# Patient Record
Sex: Male | Born: 1950
Health system: Southern US, Community
[De-identification: ages and names within clinical notes are randomized; demographics above are authoritative.]

## PROBLEM LIST (undated history)

## (undated) DIAGNOSIS — I10 Essential (primary) hypertension: Secondary | ICD-10-CM

## (undated) DIAGNOSIS — C801 Malignant (primary) neoplasm, unspecified: Secondary | ICD-10-CM

## (undated) DIAGNOSIS — K759 Inflammatory liver disease, unspecified: Secondary | ICD-10-CM

## (undated) HISTORY — PX: OTHER SURGICAL HISTORY: SHX169

## (undated) NOTE — *Deleted (*Deleted)
[facility]    CSN: [id number] Arrival date & time: 03/10/20  1452      History   Chief Complaint Chief Complaint  Patient presents with  . Chest Pain    rib pain    HPI AC COLAN is a 53 y.o. male.   HPI  Past Medical History:  Diagnosis Date  . Cancer [facility])    prostate  . Hepatitis    hepatitis c   . Hypertension     Patient Active Problem List   Diagnosis Date Noted  . Prostate cancer (HCC) 02/03/2016    Past Surgical History:  Procedure Laterality Date  . 4th index finger straightened  1990's  . finger surgery for injury  1970's right hand  . PELVIC LYMPH NODE DISSECTION N/A 02/03/2016   Procedure: PELVIC LYMPH NODE DISSECTION;  Surgeon: Crist Fat, MD;  Location: [facility];  Service: Urology;  Laterality: N/A;  . ROBOT ASSISTED LAPAROSCOPIC RADICAL PROSTATECTOMY N/A 02/03/2016   Procedure: XI ROBOTIC ASSISTED LAPAROSCOPIC RADICAL PROSTATECTOMY;  Surgeon: Crist Fat, MD;  Location: [facility];  Service: Urology;  Laterality: N/A;       Home Medications    Prior to Admission medications   Medication Sig Start Date End Date Taking? Authorizing Provider  amLODipine (NORVASC) 10 MG tablet Take 1 tablet (10 mg total) by mouth daily. 03/10/20 06/08/20  Moshe Cipro, NP  docusate sodium (COLACE) 100 MG capsule Take 1 capsule (100 mg total) by mouth 2 (two) times daily. 02/04/16   Crist Fat, MD  oxyCODONE (OXY IR/ROXICODONE) 5 MG immediate release tablet Take 1-2 tablets (5-10 mg total) by mouth every 4 (four) hours as needed for moderate pain. 02/04/16   Crist Fat, MD  sulfamethoxazole-trimethoprim (BACTRIM DS,SEPTRA DS) 800-160 MG tablet Take 1 tablet by mouth 2 (two) times daily. Start taking one day prior to your appointment for your first follow-up and catheter removal.  Continue taking for three days. 02/04/16   Crist Fat, MD    Family History History reviewed. No pertinent family history.   Social History Social History   Tobacco Use  . Smoking status: Never Smoker  . Smokeless tobacco: Never Used  Substance Use Topics  . Alcohol use: Yes    Comment: 6 pack beer most days  . Drug use: Yes    Types: Marijuana    Comment: marijuana occ last used 01-31-16     Allergies   Patient has no known allergies.   Review of Systems Review of Systems   Physical Exam Triage Vital Signs ED Triage Vitals  Enc Vitals Group     BP 03/10/20 1607 (!) 186/112     Pulse Rate 03/10/20 1607 88     Resp 03/10/20 1607 18     Temp 03/10/20 1607 98.9 F (37.2 C)     Temp Source 03/10/20 1607 Oral     SpO2 03/10/20 1607 100 %     Weight --      Height --      Head Circumference --      Peak Flow --      Pain Score 03/10/20 1606 3     Pain Loc --      Pain Edu? --      Excl. in GC? --    No data found.  Updated Vital Signs BP (!) 186/112 (BP Location: Left Arm)   Pulse 88   Temp 98.9 F (37.2 C) (Oral)   Resp 18  SpO2 100%   Visual Acuity Right Eye Distance:   Left Eye Distance:   Bilateral Distance:    Right Eye Near:   Left Eye Near:    Bilateral Near:     Physical Exam   UC Treatments / Results  Labs (all labs ordered are listed, but only abnormal results are displayed) Labs Reviewed - No data to display  EKG   Radiology No results found.  Procedures Procedures (including critical care time)  Medications Ordered in UC Medications - No data to display  Initial Impression / Assessment and Plan / UC Course  I have reviewed the triage vital signs and the nursing notes.  Pertinent labs & imaging results that were available during my care of the patient were reviewed by me and considered in my medical decision making (see chart for details).     *** Final Clinical Impressions(s) / UC Diagnoses   Final diagnoses:  Essential hypertension  Medication refill  Rib pain on right side  Muscle strain     Discharge Instructions     I have  refilled your blood pressure medication.  Take this daily to help control your blood pressure.  I think that you may have pulled a muscle on your side.  I have you take ibuprofen or Tylenol for this pain.  You may lay on a heating pad if this is not enough.  Follow up with this office or with primary care if symptoms are persisting.  Follow up in the ER for high fever, trouble swallowing, trouble breathing, other concerning symptoms.    ED Prescriptions    Medication Sig Dispense Auth. Provider   amLODipine (NORVASC) 10 MG tablet Take 1 tablet (10 mg total) by mouth daily. 90 tablet Moshe Cipro, NP     PDMP not reviewed this encounter.

---

## 2001-03-31 ENCOUNTER — Emergency Department (HOSPITAL_COMMUNITY): Admission: EM | Admit: 2001-03-31 | Discharge: 2001-03-31 | Payer: Self-pay | Admitting: Emergency Medicine

## 2004-08-11 ENCOUNTER — Ambulatory Visit: Payer: Self-pay | Admitting: Internal Medicine

## 2006-07-16 ENCOUNTER — Emergency Department (HOSPITAL_COMMUNITY): Admission: EM | Admit: 2006-07-16 | Discharge: 2006-07-16 | Payer: Self-pay | Admitting: Emergency Medicine

## 2015-08-11 ENCOUNTER — Emergency Department (HOSPITAL_COMMUNITY): Payer: 59

## 2015-08-11 ENCOUNTER — Encounter (HOSPITAL_COMMUNITY): Payer: Self-pay

## 2015-08-11 ENCOUNTER — Emergency Department (HOSPITAL_COMMUNITY)
Admission: EM | Admit: 2015-08-11 | Discharge: 2015-08-11 | Disposition: A | Discharge status: Home / Self Care | Payer: 59 | Attending: Emergency Medicine | Admitting: Emergency Medicine

## 2015-08-11 DIAGNOSIS — I10 Essential (primary) hypertension: Secondary | ICD-10-CM | POA: Diagnosis not present

## 2015-08-11 DIAGNOSIS — M25512 Pain in left shoulder: Secondary | ICD-10-CM | POA: Diagnosis not present

## 2015-08-11 DIAGNOSIS — R531 Weakness: Secondary | ICD-10-CM | POA: Diagnosis not present

## 2015-08-11 NOTE — ED Notes (Signed)
Patient here with left shoulder pain x 2 days, denies injury with same. Complains of some numbness to fingers since pain started

## 2015-08-11 NOTE — ED Notes (Signed)
Patient transported to X-ray 

## 2015-08-11 NOTE — ED Notes (Signed)
Pt is in stable condition upon d/c and ambulates from ED. 

## 2015-08-11 NOTE — ED Provider Notes (Signed)
CSN: [id number]     Arrival date & time 08/11/15  1126 History   First MD Initiated Contact with Patient 08/11/15 1554     Chief Complaint  Patient presents with  . Shoulder Pain   (Consider location/radiation/quality/duration/timing/severity/associated sxs/prior Treatment) HPI Comments: Patient with no known past medical history, doesn't see a doctor -- presents with complaint of intermittent left shoulder pain and weakness over the past 1 week. Patient has a history of a soft tissue mass on the superior aspect of his left shoulder that he has had since age 42. This is unchanged. Patient complains of an aching pain in left shoulder that resolved with ibuprofen and Goody powder. He noted that he had weakness in his left hand was able to able to turn a key or use a lighter. These symptoms are improved today but states he wanted to 'nip it in the bud'. No associated chest pain or shortness of breath. No neck pain. He does not report any injuries. Patient denies signs of stroke including: facial droop, slurred speech, aphasia, weakness/numbness in extremities, imbalance/trouble walking. The onset of this condition was acute. Aggravating factors: none. Alleviating factors: none.     Patient is a 65 y.o. male presenting with shoulder pain. The history is provided by the patient.  Shoulder Pain Associated symptoms: no back pain, no fever and no neck pain     History reviewed. No pertinent past medical history. History reviewed. No pertinent past surgical history. No family history on file. Social History  Substance Use Topics  . Smoking status: Never Smoker   . Smokeless tobacco: None  . Alcohol Use: None    Review of Systems  Constitutional: Negative for fever, diaphoresis and activity change.  HENT: Negative for rhinorrhea and sore throat.   Eyes: Negative for redness.  Respiratory: Negative for cough and shortness of breath.   Cardiovascular: Negative for chest pain, palpitations and leg  swelling.  Gastrointestinal: Negative for nausea, vomiting, abdominal pain and diarrhea.  Genitourinary: Negative for dysuria.  Musculoskeletal: Positive for arthralgias. Negative for myalgias, back pain, joint swelling, gait problem and neck pain.  Skin: Negative for rash and wound.  Neurological: Positive for weakness. Negative for syncope, light-headedness, numbness and headaches.  Psychiatric/Behavioral: The patient is not nervous/anxious.     Allergies  Review of patient's allergies indicates not on file.  Home Medications   Prior to Admission medications   Not on File   BP 198/104 mmHg  Pulse 73  Temp(Src) 97.7 F (36.5 C) (Oral)  Resp 18  SpO2 100%   Physical Exam  Constitutional: He appears well-developed and well-nourished.  HENT:  Head: Normocephalic and atraumatic.  Eyes: Conjunctivae are normal. Right eye exhibits no discharge. Left eye exhibits no discharge.  Neck: Normal range of motion. Neck supple.  Cardiovascular: Normal rate, regular rhythm and normal heart sounds.   Pulmonary/Chest: Effort normal and breath sounds normal.  Abdominal: Soft. There is no tenderness.  Musculoskeletal:       Left shoulder: He exhibits deformity (5 cm soft tissue mass anterior shoulder, likely lipoma). He exhibits normal range of motion and no tenderness.       Left elbow: Normal.       Left wrist: Normal.       Cervical back: Normal. He exhibits normal range of motion and no tenderness.       Thoracic back: Normal. He exhibits normal range of motion and no tenderness.       Left upper arm: Normal.  Left forearm: Normal.       Left hand: Normal.  Neurological: He is alert.  Skin: Skin is warm and dry.  Psychiatric: He has a normal mood and affect.  Nursing note and vitals reviewed.   ED Course  Procedures (including critical care time)  Imaging Review Dg Shoulder Left  08/11/2015  CLINICAL DATA:  Left shoulder pain and left hand weakness. No recent injury. EXAM:  LEFT SHOULDER - 2+ VIEW COMPARISON:  None. FINDINGS: Nonspecific masslike soft tissue thickening in the superior left back. No fracture, dislocation, Hill-Sachs deformity, bone erosions or suspicious focal osseous lesion. Minimal osteoarthritis in the left acromioclavicular an inferior left glenohumeral joints. No pathologic soft tissue calcifications. IMPRESSION: 1. Nonspecific masslike soft tissue thickening in the superior left back. Recommend correlation with clinical exam. Further evaluation, including any decision to pursue additional imaging, should be based on clinical assessment . 2. No fracture or malalignment in the left shoulder. No bone lesions. 3. Minimal osteoarthritis as described in the left shoulder. Electronically Signed   By: Ilona Sorrel M.D.   On: 08/11/2015 17:02   I have personally reviewed and evaluated these images and lab results as part of my medical decision-making.  ED ECG REPORT   Date: 08/11/2015  Rate: 88  Rhythm: normal sinus rhythm  QRS Axis: normal  Intervals: QT prolonged  ST/T Wave abnormalities: normal  Conduction Disutrbances:none  Narrative Interpretation:   Old EKG Reviewed: none available  I have personally reviewed the EKG tracing and agree with the computerized printout as noted.   4:18 PM Patient seen and examined. X-ray ordered. EKG reviewed.   Vital signs reviewed and are as follows: BP 198/104 mmHg  Pulse 73  Temp(Src) 97.7 F (36.5 C) (Oral)  Resp 18  SpO2 100%   4:42 PM Patient to x-ray. Discussed with Dr. Johnney Killian who will see.   5:30 PM X-ray as above. Dr. Johnney Killian has seen. No indications for admission. PCP and ortho referrals given.   Patient urged to return with worsening symptoms or other concerns, inability to move arm, chest pain, shortness of breath, color change in arm. Patient verbalized understanding and agrees with plan.   MDM   Final diagnoses:  Shoulder pain, acute, left  Essential hypertension   Patient with L  shoulder pain, intermittent, now resolved, with intermittent trouble moving hand. Likely peripheral nerve etiology, shoulder impingement? Unclear if soft tissue mass is contributing. Doubt stroke/TIA given no other associated symptoms. Patient has good peripheral pulses and No other skin findings suggestive of vascular etiology. No chest pain, shortness of breath or other symptoms consistent with ACS to make me think this is referred pain from the chest -- although admittedly patient has poorly defined risk factor profile. Feel patient can be discharged to home at this time with appropriate follow-up.    Carlisle Cater, PA-C 08/11/15 1736  Charlesetta Shanks, MD 08/11/15 [phone]

## 2015-08-11 NOTE — Discharge Instructions (Signed)
Please read and follow all provided instructions.  Your diagnoses today include:  1. Shoulder pain, acute, left   2. Essential hypertension     Tests performed today include:  An x-ray of the affected area - does NOT show any broken bones  EKG - no signs of a heart attack  Vital signs. See below for your results today.   Medications prescribed:   None  Take any prescribed medications only as directed.  Home care instructions:   Follow any educational materials contained in this packet  Follow R.I.C.E. Protocol:  R - rest your injury   I  - use ice on injury without applying directly to skin  C - compress injury with bandage or splint  E - elevate the injury as much as possible  Follow-up instructions: Please follow-up with your primary care provider or the provided orthopedic physician in the next week.   It is very important that you establish care with a primary care physician for a general physical. Please contact the referrals given to you.  Return instructions:   Please return if your fingers are numb or tingling, appear gray or blue, or you have severe pain (also elevate the arm and loosen splint or wrap if you were given one)  Please return to the Emergency Department if you experience worsening symptoms.   Please return if you have any other emergent concerns.  Additional Information:  Your vital signs today were: BP 195/108 mmHg   Pulse 73   Temp(Src) 97.7 F (36.5 C) (Oral)   Resp 23   SpO2 100% If your blood pressure (BP) was elevated above 135/85 this visit, please have this repeated by your doctor within one month. -------------- [place] [address] is a great source of information about community services available.  Access by dialing 2-1-1 from anywhere in [place], or by website -  CustodianSupply.fi.   Other Local Resources (Updated 04/2015)  [place]    Phone  Number and Address  [place] medical care - 1st and 3rd Saturday of every month  Must not qualify for public or private insurance and must have limited income [phone] [address], [place]  Child care  Emergency assistance for housing and [employer]  Medicaid [phone] [address], [place] [address]   [facility] Department  Low-cost medical care for children, communicable diseases, sexually-transmitted diseases, immunizations, maternity care, womens health and family planning [phone] [address] [place], [place] [address]  [facility]   Medication assistance for [facility] residents  Must meet income requirements [phone] [place], [place].    [place]  Child care  Emergency assistance for housing and [employer]  Medicaid [phone] [address] [place], [place] [address]  [facility] [place]   Low-cost medical care,   Monday through Friday, 9 am to 6 pm.   Accepts Medicare/Medicaid, and self-pay [phone] [address] [place], [place] [address]  [facility] for [place] care - Monday through Friday, 8:30 am - 5:30 pm  Accepts Medicaid and self-pay [phone] [address], [place], [place] [address]   [facility]  Primary medical care, including for those with sickle cell disease  Accepts Medicare, Medicaid, insurance  and self-pay [phone] [address] [place], [place]  [facility]   Primary medical care  Accepts Medicare, [place], insurance and self-pay [phone] [address] [place], [place] [address]   [facility] Department of Social Services  Child  care  Emergency assistance for housing and [employer]  Medicaid [phone] [address] [place], [place] [address]  [place] Department of Health and [employer]  Child care  Emergency assistance for housing and [employer]  Medicaid [phone] [place], [place] [address]   [facility] Medication Assistance Program  Medication assistance for [facility] residents with no insurance only  Must have a primary care doctor [phone], [place], [place]  [facility]   Primary medical care  [place], [place], insurance  [phone] [place], [place]  [facility]   Medication assistance [phone]  [facility]   Primary medical care  Accepts Medicare, [place], insurance and self-pay [phone] [address] [place], [place] [address]  [facility]   Primary medical care  Accepts Medicare, [place], insurance and self-pay [phone] [address] [place], [place] [address]  [facility]  For [place] residents between the ages of 55 and 8 who do not have any form of health insurance, Medicare, [place], or [place] benefits.  Services are provided free of charge to uninsured patients who fall within federal poverty guidelines.    Hours: Tuesdays and Thursdays, 4:15 - 8 pm [phone] [address] [place], [place] [address]  [facility]     Primary medical care  Dental care  Nutritional counseling  Pharmacy  Accepts Medicaid, Medicare, most insurance.  Fees are adjusted based on ability to pay.   [place], [place] [address], [place],  [place] [facility], [place], [place] [facility] [place], [place]  [facility]  Womens health and family planning [phone] [place]. [place], [place] care  Emergency assistance for housing and [employer]  Medicaid [phone] [address] [place], [place] [address]   [facility]    Ages 61 and older  Hours: Mondays and Thursdays, 7:00 am - 9:00 am Patients are seen on a first come, first served basis. [phone], ext. [place], [place]  Child care  Emergency assistance for housing and [employer]  Medicaid [phone] [address], [place] [address]  [facility] [place]  Medication assistance  Rental assistance  Food pantry  Medication assistance  Housing assistance  Emergency food distribution  Utility assistance [facility] [place], [place]  [address]. [place], [place] [address] Hours: Tuesdays and Thursdays from 9am - 12 noon by appointment only  [place], [place] [address]  [facility] [place] private insurance, [place], and [place].  Payment is based on a sliding scale for those without insurance.  Hours: Mondays, Tuesdays and Thursdays, 8:30 am - 5:30 pm.   [phone] [place],  [place]  [facility] - Family Medicine at [facility], [place], and [place].  Payment is based on a sliding scale for those without insurance. [phone] [address] [place], [place]  [facility] - Pediatrics at [address], research (medical), [employer], and [place].  Payment is  based on a sliding scale for those without insurance [phone] [address], [employer], [place]  [facility] - Pediatrics at [employer], [place], and [place].  Payment is based on a sliding scale for those without insurance. [phone] [place], [place]  [facility] - Pediatrics at [facility], [place], and [place].  Payment is based on a sliding scale for those without insurance. [phone], ext. [phone] [place], [place].    [place] care.  Accepts Medicaid and self-pay. [place], [place]

## 2015-08-31 ENCOUNTER — Encounter: Payer: Self-pay | Admitting: Family Medicine

## 2015-08-31 ENCOUNTER — Ambulatory Visit (INDEPENDENT_AMBULATORY_CARE_PROVIDER_SITE_OTHER): Payer: 59 | Admitting: Family Medicine

## 2015-08-31 VITALS — BP 153/112 | HR 95 | Temp 98.0°F | Ht 70.0 in | Wt 136.0 lb

## 2015-08-31 DIAGNOSIS — Z1159 Encounter for screening for other viral diseases: Secondary | ICD-10-CM | POA: Diagnosis not present

## 2015-08-31 DIAGNOSIS — Z125 Encounter for screening for malignant neoplasm of prostate: Secondary | ICD-10-CM | POA: Diagnosis not present

## 2015-08-31 DIAGNOSIS — Z7689 Persons encountering health services in other specified circumstances: Secondary | ICD-10-CM

## 2015-08-31 DIAGNOSIS — I10 Essential (primary) hypertension: Secondary | ICD-10-CM

## 2015-08-31 DIAGNOSIS — Z114 Encounter for screening for human immunodeficiency virus [HIV]: Secondary | ICD-10-CM

## 2015-08-31 DIAGNOSIS — Z23 Encounter for immunization: Secondary | ICD-10-CM | POA: Diagnosis not present

## 2015-08-31 DIAGNOSIS — Z1211 Encounter for screening for malignant neoplasm of colon: Secondary | ICD-10-CM

## 2015-08-31 LAB — CBC WITH DIFFERENTIAL/PLATELET
BASOS ABS: 0 {cells}/uL (ref 0–200)
BASOS PCT: 0 %
EOS ABS: 41 {cells}/uL (ref 15–500)
Eosinophils Relative: 1 %
HEMATOCRIT: 46.1 % (ref 38.5–50.0)
Hemoglobin: 15.1 g/dL (ref 13.2–17.1)
LYMPHS PCT: 44 %
Lymphs Abs: 1804 cells/uL (ref 850–3900)
MCH: 27.9 pg (ref 27.0–33.0)
MCHC: 32.8 g/dL (ref 32.0–36.0)
MCV: 85.1 fL (ref 80.0–100.0)
MONO ABS: 533 {cells}/uL (ref 200–950)
MONOS PCT: 13 %
MPV: 9.4 fL (ref 7.5–12.5)
NEUTROS PCT: 42 %
Neutro Abs: 1722 cells/uL (ref 1500–7800)
PLATELETS: 212 10*3/uL (ref 140–400)
RBC: 5.42 MIL/uL (ref 4.20–5.80)
RDW: 15.6 % — AB (ref 11.0–15.0)
WBC: 4.1 10*3/uL (ref 3.8–10.8)

## 2015-08-31 LAB — TSH: TSH: 2.82 m[IU]/L (ref 0.40–4.50)

## 2015-08-31 LAB — COMPLETE METABOLIC PANEL WITH GFR
ALT: 27 U/L (ref 9–46)
AST: 39 U/L — AB (ref 10–35)
Albumin: 4.3 g/dL (ref 3.6–5.1)
Alkaline Phosphatase: 52 U/L (ref 40–115)
BILIRUBIN TOTAL: 0.8 mg/dL (ref 0.2–1.2)
BUN: 10 mg/dL (ref 7–25)
CHLORIDE: 100 mmol/L (ref 98–110)
CO2: 29 mmol/L (ref 20–31)
CREATININE: 0.91 mg/dL (ref 0.70–1.25)
Calcium: 9.5 mg/dL (ref 8.6–10.3)
GFR, Est Non African American: 88 mL/min (ref >=60)
GLUCOSE: 91 mg/dL (ref 65–99)
Potassium: 4.7 mmol/L (ref 3.5–5.3)
Sodium: 138 mmol/L (ref 135–146)
TOTAL PROTEIN: 7.6 g/dL (ref 6.1–8.1)

## 2015-08-31 LAB — LIPID PANEL
Cholesterol: 262 mg/dL — ABNORMAL HIGH (ref 125–200)
HDL: 131 mg/dL (ref >=40)
LDL CALC: 123 mg/dL (ref <130)
Total CHOL/HDL Ratio: 2 Ratio (ref <=5.0)
Triglycerides: 41 mg/dL (ref <150)
VLDL: 8 mg/dL (ref <30)

## 2015-08-31 MED ORDER — AMLODIPINE BESYLATE 10 MG PO TABS: 10.0000 mg | ORAL_TABLET | Freq: Every day | ORAL | Status: DC

## 2015-08-31 NOTE — Progress Notes (Signed)
Patient ID: Dennis Levine, male   DOB: [birth year], 65 y.o.   MRN: [id number]   Dennis Levine, is a 65 y.o. male  [id number]  [id number]  DOB - 07/01/1950  CC:  Chief Complaint  Patient presents with  . new patient/get established    denies any shoulder pain today, in ER had Hypertension, here for follow up and rx if indicated       HPI: Dennis Levine is a 65 y.o. male here to establish care. He was seen in ED on 5/2 for left shoulder pain with numbness and weakness in left hand. He was referred to ortho about that. He was found to have BP of 195/108 and was referred here for further assessment and treatment. His only other complaint is of a large growth on his posterior left shoulder which has been there for years. He does think that it has gradually gotten larger over time. He reports the shoulder pain he was experiencing is gone but he still sometimes feels some numbness in left arm and the grip is weaker on the left. He is on no medications.   He has not had regular health care for many years.  Health Maintenance:  He is in need of Tdap, pneumonia, colon cancer screening, HIV and Hep C screening. He has never been screened for prostate cancer.  No Known Allergies History reviewed. No pertinent past medical history. No current outpatient prescriptions on file prior to visit.   No current facility-administered medications on file prior to visit.   History reviewed. No pertinent family history. Social History   Social History  . Marital Status: Single    Spouse Name: N/A  . Number of Children: N/A  . Years of Education: N/A   Occupational History  . Not on file.   Social History Main Topics  . Smoking status: Never Smoker   . Smokeless tobacco: Not on file  . Alcohol Use: No  . Drug Use: No  . Sexual Activity: Not on file   Other Topics Concern  . Not on file   Social History Narrative    Review of Systems: Constitutional: Negative for fever, chills,  appetite change, weight loss,  Fatigue. Skin: Negative for rashes or lesions of concern.Positive for growth on posterior left shoulder HENT: Negative for ear pain, ear discharge.nose bleeds. Reports needing dental care Eyes: Negative for pain, discharge, redness, itching. Reports decreased vision and needing glasses Neck: Negative for pain, stiffness Respiratory: Negative for cough, shortness of breath,   Cardiovascular: Negative for chest pain, palpitations and leg swelling. Gastrointestinal: Negative for abdominal pain, nausea, vomiting, diarrhea, constipations Genitourinary: Negative for dysuria, urgency, frequency, hematuria,  Musculoskeletal: Negative for back pain, joint pain, joint  swelling, and gait problem.Positive for recent shoulder pain, which has resolved Neurological: Negative for dizziness, tremors, seizures, syncope,   light-headedness, and headaches. Positive for numbness and weakness of left hand Hematological: Negative for easy bruising or bleeding Psychiatric/Behavioral: Negative for depression, anxiety, decreased concentration, confusion   Objective:   Filed Vitals:   08/31/15 1115  BP: 153/112  Pulse: 95  Temp: 98 F (36.7 C)    Physical Exam: Constitutional: Patient appears well-developed and well-nourished. No distress. HENT: Normocephalic, atraumatic, External right and left ear normal. Oropharynx is clear and moist. There are missing teeth and dental decay present Eyes: Conjunctivae and EOM are normal. PERRLA, no scleral icterus.Irises with gray borders Neck: Normal ROM. Neck supple. No lymphadenopathy, No thyromegaly. CVS: RRR, S1/S2 +, no murmurs, no gallops, no  rubs Pulmonary: Effort and breath sounds normal, no stridor, rhonchi, wheezes, rales.  Abdominal: Soft. Normoactive BS,, no distension, tenderness, rebound or guarding.  Musculoskeletal: Normal range of motion. No edema and no tenderness.  Neuro: Alert.Normal muscle tone coordination.  Non-focal Skin: Skin is warm and dry. No rash noted. Not diaphoretic. No erythema. No pallor.There is a large growth consistent with lipoma on upper left posterior shoulder Psychiatric: Normal mood and affect. Behavior, judgment, thought content normal.  No results found for: WBC, HGB, HCT, MCV, PLT No results found for: CREATININE, BUN, NA, K, CL, CO2  No results found for: HGBA1C Lipid Panel  No results found for: CHOL, TRIG, HDL, CHOLHDL, VLDL, LDLCALC     Assessment and plan:   1. Essential hypertension  - COMPLETE METABOLIC PANEL WITH GFR - CBC with Differential - Lipid panel - TSH  2. Prostate cancer screening  - PSA  3. Screening for HIV (human immunodeficiency virus)  - HIV antibody (with reflex)  4. Need for hepatitis C screening test  - Hepatitis C Antibody  5. Need for Tdap vaccination  - Tdap vaccine greater than or equal to 7yo IM  6. Need for prophylactic vaccination against Streptococcus pneumoniae (pneumococcus)  - Pneumococcal polysaccharide vaccine 23-valent greater than or equal to 2yo subcutaneous/IM  7. Colon cancer screening  - Ambulatory referral to Gastroenterology  8. Encounter to establish care - I have reviewed information presented by the patient w/o interpreter and review hospital records.   Return in about 3 weeks (around 09/21/2015).  The patient was given clear instructions to go to ER or return to medical center if symptoms don't improve, worsen or new problems develop. The patient verbalized understanding.    Micheline Chapman FNP  08/31/2015, 11:38 AM

## 2015-08-31 NOTE — Patient Instructions (Signed)
Nurse visit for BP check in 3 weeks. Watch salt in diet carefully.  Take amlodipine 10 mg for BP every day. Call number given in ED about arm numbness and weakness.

## 2015-09-01 LAB — HIV ANTIBODY (ROUTINE TESTING W REFLEX): HIV 1&2 Ab, 4th Generation: NONREACTIVE

## 2015-09-01 LAB — PSA: PSA: 26.36 ng/mL — AB (ref <=4.00)

## 2015-09-01 LAB — HEPATITIS C ANTIBODY: HCV AB: REACTIVE — AB

## 2015-09-02 LAB — HEPATITIS C RNA QUANTITATIVE
HCV Quantitative Log: 6.13 {Log} — ABNORMAL HIGH (ref <1.18)
HCV Quantitative: 1345675 IU/mL — ABNORMAL HIGH (ref <15)

## 2015-09-04 ENCOUNTER — Other Ambulatory Visit: Payer: Self-pay | Admitting: Family Medicine

## 2015-09-04 DIAGNOSIS — R972 Elevated prostate specific antigen [PSA]: Secondary | ICD-10-CM

## 2015-09-04 DIAGNOSIS — B1920 Unspecified viral hepatitis C without hepatic coma: Secondary | ICD-10-CM

## 2015-09-08 ENCOUNTER — Telehealth: Payer: Self-pay

## 2015-09-08 NOTE — Telephone Encounter (Signed)
Called patient, no answer. Left message to call back. Thanks!

## 2015-09-08 NOTE — Telephone Encounter (Signed)
-----   Message from Micheline Chapman, NP sent at 09/04/2015  8:01 AM EDT ----- Couple of things of concern in bloodwork. Hep C positve. Need to send to [facility]. PSA (prostate) was elevated. Need to send to urologist.

## 2015-09-09 NOTE — Telephone Encounter (Signed)
I have tried to call multiple times, with no response will mail letter today marked "confidential". Thanks!

## 2015-09-21 ENCOUNTER — Ambulatory Visit: Payer: 59

## 2015-09-21 VITALS — BP 132/86 | HR 78

## 2015-09-21 DIAGNOSIS — I10 Essential (primary) hypertension: Secondary | ICD-10-CM

## 2015-09-30 ENCOUNTER — Telehealth: Payer: Self-pay

## 2015-09-30 DIAGNOSIS — I10 Essential (primary) hypertension: Secondary | ICD-10-CM

## 2015-09-30 MED ORDER — AMLODIPINE BESYLATE 10 MG PO TABS: 10.0000 mg | ORAL_TABLET | Freq: Every day | ORAL | Status: DC

## 2015-09-30 NOTE — Telephone Encounter (Signed)
Pt is requesting a medication refill on his Amlodopine. Thanks!

## 2015-09-30 NOTE — Telephone Encounter (Signed)
This has been sent into pharmacy. Thanks!  

## 2015-10-02 ENCOUNTER — Encounter: Payer: Self-pay | Admitting: Family Medicine

## 2015-10-12 NOTE — Progress Notes (Signed)
Tiffany, can you tell me how to close this encounter? It says I do not have clearance to do so. Thank you,  Graciella Freer

## 2015-10-21 ENCOUNTER — Other Ambulatory Visit: Payer: Self-pay

## 2015-10-21 DIAGNOSIS — I10 Essential (primary) hypertension: Secondary | ICD-10-CM

## 2015-10-21 MED ORDER — AMLODIPINE BESYLATE 10 MG PO TABS: 10.0000 mg | ORAL_TABLET | Freq: Every day | ORAL | Status: DC

## 2015-10-21 MED ORDER — AMLODIPINE BESYLATE 10 MG PO TABS
10.0000 mg | ORAL_TABLET | Freq: Every day | ORAL | Status: DC
Start: 2015-10-21 — End: 2015-10-21

## 2015-10-21 NOTE — Telephone Encounter (Signed)
Sent into refill for amlodipine to mail order pharmacy. Thanks!~

## 2015-11-10 ENCOUNTER — Other Ambulatory Visit (HOSPITAL_COMMUNITY): Payer: Self-pay | Admitting: Urology

## 2015-11-10 DIAGNOSIS — C61 Malignant neoplasm of prostate: Secondary | ICD-10-CM

## 2015-11-20 ENCOUNTER — Encounter (HOSPITAL_COMMUNITY)
Admission: RE | Admit: 2015-11-20 | Discharge: 2015-11-20 | Disposition: A | Discharge status: Home / Self Care | Payer: 59 | Source: Ambulatory Visit | Attending: Urology | Admitting: Urology

## 2015-11-20 DIAGNOSIS — C61 Malignant neoplasm of prostate: Secondary | ICD-10-CM | POA: Insufficient documentation

## 2016-01-05 ENCOUNTER — Other Ambulatory Visit: Payer: Self-pay | Admitting: Urology

## 2016-01-26 ENCOUNTER — Encounter: Payer: Self-pay | Admitting: Physician Assistant

## 2016-01-28 NOTE — Patient Instructions (Addendum)
Dennis Levine  01/28/2016   Your procedure is scheduled on:  02-03-16  Report to [facility]  Entrance take [facility]  elevators to 3rd floor to  [place] at 1000   AM.  Call this number if you have problems the morning of surgery [phone]   Remember: ONLY 1 PERSON MAY GO WITH YOU TO SHORT STAY TO GET  READY MORNING OF [place].  Do not eat food :After Midnight tonight, clear liquids all day Tuesday 02-02-16, no clear liquids after midnight Tuesday.   FOLLOW ANY BOWEL PREP OR CLEAR LIQUID INSTRUCTIONS GIVEN BY DR Louis Meckel   Take these medicines the morning of surgery with A SIP OF WATER: AMLODIPINE                                You may not have any metal on your body including hair pins and              piercings  Do not wear jewelry, make-up, lotions, powders or perfumes, deodorant             Do not wear nail polish.  Do not shave  48 hours prior to surgery.              Men may shave face and neck.   Do not bring valuables to the hospital. [place].  Contacts, dentures or bridgework may not be worn into surgery.  Leave suitcase in the car. After surgery it may be brought to your room.                  Please read over the following fact sheets you were given: _____________________________________________________________________             [facility] - Preparing for Surgery Before surgery, you can play an important role.  Because skin is not sterile, your skin needs to be as free of germs as possible.  You can reduce the number of germs on your skin by washing with CHG (chlorahexidine gluconate) soap before surgery.  CHG is an antiseptic cleaner which kills germs and bonds with the skin to continue killing germs even after washing. Please DO NOT use if you have an allergy to CHG or antibacterial soaps.  If your skin becomes reddened/irritated stop using the CHG and inform your nurse  when you arrive at Short Stay. Do not shave (including legs and underarms) for at least 48 hours prior to the first CHG shower.  You may shave your face/neck. Please follow these instructions carefully:  1.  Shower with CHG Soap the night before surgery and the  morning of Surgery.  2.  If you choose to wash your hair, wash your hair first as usual with your  normal  shampoo.  3.  After you shampoo, rinse your hair and body thoroughly to remove the  shampoo.                           4.  Use CHG as you would any other liquid soap.  You can apply chg directly  to the skin and wash  Gently with a scrungie or clean washcloth.  5.  Apply the CHG Soap to your body ONLY FROM THE NECK DOWN.   Do not use on face/ open                           Wound or open sores. Avoid contact with eyes, ears mouth and genitals (private parts).                       Wash face,  Genitals (private parts) with your normal soap.             6.  Wash thoroughly, paying special attention to the area where your surgery  will be performed.  7.  Thoroughly rinse your body with warm water from the neck down.  8.  DO NOT shower/wash with your normal soap after using and rinsing off  the CHG Soap.                9.  Pat yourself dry with a clean towel.            10.  Wear clean pajamas.            11.  Place clean sheets on your bed the night of your first shower and do not  sleep with pets. Day of Surgery : Do not apply any lotions/deodorants the morning of surgery.  Please wear clean clothes to the hospital/surgery center.  FAILURE TO FOLLOW THESE INSTRUCTIONS MAY RESULT IN THE CANCELLATION OF YOUR SURGERY PATIENT SIGNATURE_________________________________  NURSE SIGNATURE__________________________________  ________________________________________________________________________  WHAT IS A BLOOD TRANSFUSION? Blood Transfusion Information  A transfusion is the replacement of blood or some of its  parts. Blood is made up of multiple cells which provide different functions.  Red blood cells carry oxygen and are used for blood loss replacement.  White blood cells fight against infection.  Platelets control bleeding.  Plasma helps clot blood.  Other blood products are available for specialized needs, such as hemophilia or other clotting disorders. BEFORE THE TRANSFUSION  Who gives blood for transfusions?   Healthy volunteers who are fully evaluated to make sure their blood is safe. This is blood bank blood. Transfusion therapy is the safest it has ever been in the practice of medicine. Before blood is taken from a donor, a complete history is taken to make sure that person has no history of diseases nor engages in risky social behavior (examples are intravenous drug use or sexual activity with multiple partners). The donor's travel history is screened to minimize risk of transmitting infections, such as malaria. The donated blood is tested for signs of infectious diseases, such as HIV and hepatitis. The blood is then tested to be sure it is compatible with you in order to minimize the chance of a transfusion reaction. If you or a relative donates blood, this is often done in anticipation of surgery and is not appropriate for emergency situations. It takes many days to process the donated blood. RISKS AND COMPLICATIONS Although transfusion therapy is very safe and saves many lives, the main dangers of transfusion include:   Getting an infectious disease.  Developing a transfusion reaction. This is an allergic reaction to something in the blood you were given. Every precaution is taken to prevent this. The decision to have a blood transfusion has been considered carefully by your caregiver before blood is given. Blood is not given unless the benefits outweigh  the risks. AFTER THE TRANSFUSION  Right after receiving a blood transfusion, you will usually feel much better and more energetic.  This is especially true if your red blood cells have gotten low (anemic). The transfusion raises the level of the red blood cells which carry oxygen, and this usually causes an energy increase.  The nurse administering the transfusion will monitor you carefully for complications. HOME CARE INSTRUCTIONS  No special instructions are needed after a transfusion. You may find your energy is better. Speak with your caregiver about any limitations on activity for underlying diseases you may have. SEEK MEDICAL CARE IF:   Your condition is not improving after your transfusion.  You develop redness or irritation at the intravenous (IV) site. SEEK IMMEDIATE MEDICAL CARE IF:  Any of the following symptoms occur over the next 12 hours:  Shaking chills.  You have a temperature by mouth above 102 F (38.9 C), not controlled by medicine.  Chest, back, or muscle pain.  People around you feel you are not acting correctly or are confused.  Shortness of breath or difficulty breathing.  Dizziness and fainting.  You get a rash or develop hives.  You have a decrease in urine output.  Your urine turns a dark color or changes to pink, red, or brown. Any of the following symptoms occur over the next 10 days:  You have a temperature by mouth above 102 F (38.9 C), not controlled by medicine.  Shortness of breath.  Weakness after normal activity.  The white part of the eye turns yellow (jaundice).  You have a decrease in the amount of urine or are urinating less often.  Your urine turns a dark color or changes to pink, red, or brown. Document Released: 03/25/2000 Document Revised: 06/20/2011 Document Reviewed: 11/12/2007 [facility] Patient Information 2014 [place], [place].  _______________________________________________________________________

## 2016-01-28 NOTE — Progress Notes (Signed)
EKG 08-11-15 EPIC

## 2016-02-01 ENCOUNTER — Encounter (HOSPITAL_COMMUNITY)
Admission: RE | Admit: 2016-02-01 | Discharge: 2016-02-01 | Disposition: A | Discharge status: Home / Self Care | Payer: 59 | Source: Ambulatory Visit | Attending: Urology | Admitting: Urology

## 2016-02-01 ENCOUNTER — Encounter (HOSPITAL_COMMUNITY): Payer: Self-pay

## 2016-02-01 DIAGNOSIS — Z01812 Encounter for preprocedural laboratory examination: Secondary | ICD-10-CM

## 2016-02-01 DIAGNOSIS — I10 Essential (primary) hypertension: Secondary | ICD-10-CM | POA: Insufficient documentation

## 2016-02-01 DIAGNOSIS — C61 Malignant neoplasm of prostate: Secondary | ICD-10-CM | POA: Insufficient documentation

## 2016-02-01 HISTORY — DX: Malignant (primary) neoplasm, unspecified: C80.1

## 2016-02-01 HISTORY — DX: Inflammatory liver disease, unspecified: K75.9

## 2016-02-01 HISTORY — DX: Essential (primary) hypertension: I10

## 2016-02-01 LAB — CBC
HEMATOCRIT: 43.4 % (ref 39.0–52.0)
HEMOGLOBIN: 14.6 g/dL (ref 13.0–17.0)
MCH: 28.3 pg (ref 26.0–34.0)
MCHC: 33.6 g/dL (ref 30.0–36.0)
MCV: 84.3 fL (ref 78.0–100.0)
Platelets: 233 10*3/uL (ref 150–400)
RBC: 5.15 MIL/uL (ref 4.22–5.81)
RDW: 14.1 % (ref 11.5–15.5)
WBC: 5.2 10*3/uL (ref 4.0–10.5)

## 2016-02-01 LAB — COMPREHENSIVE METABOLIC PANEL
ALBUMIN: 4.1 g/dL (ref 3.5–5.0)
ALK PHOS: 59 U/L (ref 38–126)
ALT: 20 U/L (ref 17–63)
ANION GAP: 10 (ref 5–15)
AST: 26 U/L (ref 15–41)
BILIRUBIN TOTAL: 1.1 mg/dL (ref 0.3–1.2)
BUN: 11 mg/dL (ref 6–20)
CALCIUM: 9.2 mg/dL (ref 8.9–10.3)
CO2: 22 mmol/L (ref 22–32)
Chloride: 101 mmol/L (ref 101–111)
Creatinine, Ser: 0.56 mg/dL — ABNORMAL LOW (ref 0.61–1.24)
GFR calc Af Amer: >60 mL/min (ref >60)
GFR calc non Af Amer: >60 mL/min (ref >60)
GLUCOSE: 85 mg/dL (ref 65–99)
Potassium: 4 mmol/L (ref 3.5–5.1)
SODIUM: 133 mmol/L — AB (ref 135–145)
TOTAL PROTEIN: 8 g/dL (ref 6.5–8.1)

## 2016-02-01 LAB — ABO/RH: ABO/RH(D): B POS

## 2016-02-01 LAB — PSA: PSA: 11.77 ng/mL — AB (ref 0.00–4.00)

## 2016-02-03 ENCOUNTER — Inpatient Hospital Stay (HOSPITAL_COMMUNITY): Payer: 59 | Admitting: Certified Registered"

## 2016-02-03 ENCOUNTER — Encounter (HOSPITAL_COMMUNITY)
Admission: RE | Disposition: A | Discharge status: Home / Self Care | Payer: Self-pay | Source: Ambulatory Visit | Attending: Urology

## 2016-02-03 ENCOUNTER — Encounter (HOSPITAL_COMMUNITY): Payer: Self-pay

## 2016-02-03 ENCOUNTER — Inpatient Hospital Stay (HOSPITAL_COMMUNITY)
Admission: RE | Admit: 2016-02-03 | Discharge: 2016-02-04 | DRG: 708 | Disposition: A | Discharge status: Home / Self Care | Payer: 59 | Source: Ambulatory Visit | Attending: Urology | Admitting: Urology

## 2016-02-03 DIAGNOSIS — Z87891 Personal history of nicotine dependence: Secondary | ICD-10-CM

## 2016-02-03 DIAGNOSIS — K623 Rectal prolapse: Secondary | ICD-10-CM | POA: Diagnosis present

## 2016-02-03 DIAGNOSIS — I10 Essential (primary) hypertension: Secondary | ICD-10-CM | POA: Diagnosis present

## 2016-02-03 DIAGNOSIS — C61 Malignant neoplasm of prostate: Secondary | ICD-10-CM | POA: Diagnosis present

## 2016-02-03 DIAGNOSIS — I771 Stricture of artery: Secondary | ICD-10-CM | POA: Diagnosis present

## 2016-02-03 HISTORY — PX: PELVIC LYMPH NODE DISSECTION: SHX6543

## 2016-02-03 HISTORY — PX: ROBOT ASSISTED LAPAROSCOPIC RADICAL PROSTATECTOMY: SHX5141

## 2016-02-03 LAB — BASIC METABOLIC PANEL
ANION GAP: 15 (ref 5–15)
BUN: 17 mg/dL (ref 6–20)
CALCIUM: 9 mg/dL (ref 8.9–10.3)
CO2: 19 mmol/L — ABNORMAL LOW (ref 22–32)
CREATININE: 0.78 mg/dL (ref 0.61–1.24)
Chloride: 102 mmol/L (ref 101–111)
Glucose, Bld: 84 mg/dL (ref 65–99)
Potassium: 4.9 mmol/L (ref 3.5–5.1)
SODIUM: 136 mmol/L (ref 135–145)

## 2016-02-03 LAB — TYPE AND SCREEN
ABO/RH(D): B POS
Antibody Screen: NEGATIVE

## 2016-02-03 LAB — HEMOGLOBIN AND HEMATOCRIT, BLOOD
HCT: 44.9 % (ref 39.0–52.0)
HEMOGLOBIN: 14.5 g/dL (ref 13.0–17.0)

## 2016-02-03 LAB — URINE CULTURE: Culture: NO GROWTH

## 2016-02-03 SURGERY — PROSTATECTOMY, RADICAL, ROBOT-ASSISTED, LAPAROSCOPIC
Anesthesia: General

## 2016-02-03 MED ORDER — FENTANYL CITRATE (PF) 250 MCG/5ML IJ SOLN
INTRAMUSCULAR | Status: AC
  Filled 2016-02-03: qty 5

## 2016-02-03 MED ORDER — BUPIVACAINE HCL (PF) 0.25 % IJ SOLN
INTRAMUSCULAR | Status: DC | PRN
  Administered 2016-02-03: 14 mL

## 2016-02-03 MED ORDER — HYDROMORPHONE HCL 1 MG/ML IJ SOLN
0.5000 mg | INTRAMUSCULAR | Status: DC | PRN
  Administered 2016-02-04: 1 mg via INTRAVENOUS
  Filled 2016-02-03: qty 1

## 2016-02-03 MED ORDER — PROPOFOL 10 MG/ML IV BOLUS
INTRAVENOUS | Status: AC
  Filled 2016-02-03: qty 20

## 2016-02-03 MED ORDER — PROPOFOL 10 MG/ML IV BOLUS
INTRAVENOUS | Status: DC | PRN
  Administered 2016-02-03: 100 mg via INTRAVENOUS
  Administered 2016-02-03: 40 mg via INTRAVENOUS

## 2016-02-03 MED ORDER — MIDAZOLAM HCL 2 MG/2ML IJ SOLN
INTRAMUSCULAR | Status: AC
  Filled 2016-02-03: qty 2

## 2016-02-03 MED ORDER — PROMETHAZINE HCL 25 MG/ML IJ SOLN
6.2500 mg | INTRAMUSCULAR | Status: DC | PRN
Start: 2016-02-03 — End: 2016-02-03

## 2016-02-03 MED ORDER — ONDANSETRON HCL 4 MG/2ML IJ SOLN
INTRAMUSCULAR | Status: AC
  Filled 2016-02-03: qty 2

## 2016-02-03 MED ORDER — ACETAMINOPHEN 10 MG/ML IV SOLN
INTRAVENOUS | Status: AC
  Administered 2016-02-03: 1000 mg via INTRAVENOUS
  Filled 2016-02-03: qty 100

## 2016-02-03 MED ORDER — CEFAZOLIN IN D5W 1 GM/50ML IV SOLN
1.0000 g | Freq: Three times a day (TID) | INTRAVENOUS | Status: AC
  Administered 2016-02-03 – 2016-02-04 (×2): 1 g via INTRAVENOUS
  Filled 2016-02-03 (×3): qty 50

## 2016-02-03 MED ORDER — ACETAMINOPHEN 325 MG PO TABS: 650.0000 mg | ORAL_TABLET | ORAL | Status: DC | PRN

## 2016-02-03 MED ORDER — ONDANSETRON HCL 4 MG/2ML IJ SOLN
INTRAMUSCULAR | Status: DC | PRN
  Administered 2016-02-03: 4 mg via INTRAVENOUS

## 2016-02-03 MED ORDER — FENTANYL CITRATE (PF) 250 MCG/5ML IJ SOLN
INTRAMUSCULAR | Status: DC | PRN
  Administered 2016-02-03 (×7): 50 ug via INTRAVENOUS

## 2016-02-03 MED ORDER — FENTANYL CITRATE (PF) 100 MCG/2ML IJ SOLN: 25.0000 ug | INTRAMUSCULAR | Status: DC | PRN

## 2016-02-03 MED ORDER — SODIUM CHLORIDE 0.9 % IJ SOLN
INTRAMUSCULAR | Status: DC | PRN
  Administered 2016-02-03: 20 mL

## 2016-02-03 MED ORDER — CEFAZOLIN SODIUM-DEXTROSE 2-4 GM/100ML-% IV SOLN
INTRAVENOUS | Status: AC
Start: 2016-02-03 — End: 2016-02-03
  Filled 2016-02-03: qty 100

## 2016-02-03 MED ORDER — LACTATED RINGERS IV SOLN
INTRAVENOUS | Status: DC
  Administered 2016-02-03 (×4): via INTRAVENOUS

## 2016-02-03 MED ORDER — PHENYLEPHRINE HCL 10 MG/ML IJ SOLN
INTRAMUSCULAR | Status: AC
  Filled 2016-02-03: qty 1

## 2016-02-03 MED ORDER — MIDAZOLAM HCL 2 MG/2ML IJ SOLN
INTRAMUSCULAR | Status: DC | PRN
  Administered 2016-02-03: 2 mg via INTRAVENOUS

## 2016-02-03 MED ORDER — LACTATED RINGERS IR SOLN
Status: DC | PRN
  Administered 2016-02-03: 1000 mL

## 2016-02-03 MED ORDER — AMLODIPINE BESYLATE 10 MG PO TABS
10.0000 mg | ORAL_TABLET | Freq: Every day | ORAL | Status: DC
  Administered 2016-02-04: 10 mg via ORAL
  Filled 2016-02-03: qty 1

## 2016-02-03 MED ORDER — OXYBUTYNIN CHLORIDE 5 MG PO TABS: 5.0000 mg | ORAL_TABLET | Freq: Three times a day (TID) | ORAL | Status: DC | PRN

## 2016-02-03 MED ORDER — SODIUM CHLORIDE 0.9 % IV BOLUS (SEPSIS)
1000.0000 mL | Freq: Once | INTRAVENOUS | Status: AC
  Administered 2016-02-03: 1000 mL via INTRAVENOUS

## 2016-02-03 MED ORDER — BUPIVACAINE LIPOSOME 1.3 % IJ SUSP
20.0000 mL | Freq: Once | INTRAMUSCULAR | Status: AC
  Administered 2016-02-03: 20 mL
  Filled 2016-02-03: qty 20

## 2016-02-03 MED ORDER — DIPHENHYDRAMINE HCL 50 MG/ML IJ SOLN: 12.5000 mg | Freq: Four times a day (QID) | INTRAMUSCULAR | Status: DC | PRN

## 2016-02-03 MED ORDER — SENNA 8.6 MG PO TABS
1.0000 | ORAL_TABLET | Freq: Two times a day (BID) | ORAL | Status: DC
  Administered 2016-02-03 – 2016-02-04 (×2): 8.6 mg via ORAL
  Filled 2016-02-03 (×2): qty 1

## 2016-02-03 MED ORDER — BUPIVACAINE HCL (PF) 0.25 % IJ SOLN
INTRAMUSCULAR | Status: AC
  Filled 2016-02-03: qty 30

## 2016-02-03 MED ORDER — DOCUSATE SODIUM 100 MG PO CAPS
100.0000 mg | ORAL_CAPSULE | Freq: Two times a day (BID) | ORAL | Status: DC
  Administered 2016-02-03 – 2016-02-04 (×2): 100 mg via ORAL
  Filled 2016-02-03 (×2): qty 1

## 2016-02-03 MED ORDER — CEFAZOLIN SODIUM-DEXTROSE 2-4 GM/100ML-% IV SOLN
2.0000 g | INTRAVENOUS | Status: AC
  Administered 2016-02-03: 2 g via INTRAVENOUS
  Filled 2016-02-03: qty 100

## 2016-02-03 MED ORDER — DEXAMETHASONE SODIUM PHOSPHATE 10 MG/ML IJ SOLN
INTRAMUSCULAR | Status: AC
  Filled 2016-02-03: qty 1

## 2016-02-03 MED ORDER — ACETAMINOPHEN 10 MG/ML IV SOLN: 1000.0000 mg | Freq: Once | INTRAVENOUS | Status: DC

## 2016-02-03 MED ORDER — ACETAMINOPHEN 10 MG/ML IV SOLN
1000.0000 mg | Freq: Four times a day (QID) | INTRAVENOUS | Status: DC
  Administered 2016-02-03 – 2016-02-04 (×3): 1000 mg via INTRAVENOUS
  Filled 2016-02-03 (×3): qty 100

## 2016-02-03 MED ORDER — STERILE WATER FOR IRRIGATION IR SOLN
Status: DC | PRN
  Administered 2016-02-03: 1000 mL

## 2016-02-03 MED ORDER — DEXTROSE-NACL 5-0.45 % IV SOLN
INTRAVENOUS | Status: DC
  Administered 2016-02-03 – 2016-02-04 (×2): via INTRAVENOUS

## 2016-02-03 MED ORDER — LIDOCAINE 2% (20 MG/ML) 5 ML SYRINGE
INTRAMUSCULAR | Status: AC
  Filled 2016-02-03: qty 5

## 2016-02-03 MED ORDER — SUGAMMADEX SODIUM 200 MG/2ML IV SOLN
INTRAVENOUS | Status: AC
  Filled 2016-02-03: qty 2

## 2016-02-03 MED ORDER — SUGAMMADEX SODIUM 200 MG/2ML IV SOLN
INTRAVENOUS | Status: DC | PRN
  Administered 2016-02-03: 120 mg via INTRAVENOUS

## 2016-02-03 MED ORDER — PHENYLEPHRINE 40 MCG/ML (10ML) SYRINGE FOR IV PUSH (FOR BLOOD PRESSURE SUPPORT)
PREFILLED_SYRINGE | INTRAVENOUS | Status: DC | PRN
  Administered 2016-02-03 (×5): 80 ug via INTRAVENOUS
  Administered 2016-02-03: 40 ug via INTRAVENOUS

## 2016-02-03 MED ORDER — LIDOCAINE 2% (20 MG/ML) 5 ML SYRINGE
INTRAMUSCULAR | Status: DC | PRN
  Administered 2016-02-03: 40 mg via INTRAVENOUS

## 2016-02-03 MED ORDER — CIPROFLOXACIN IN D5W 400 MG/200ML IV SOLN
400.0000 mg | INTRAVENOUS | Status: AC
Start: 2016-02-03 — End: 2016-02-03
  Administered 2016-02-03: 400 mg via INTRAVENOUS

## 2016-02-03 MED ORDER — ROCURONIUM BROMIDE 10 MG/ML (PF) SYRINGE
PREFILLED_SYRINGE | INTRAVENOUS | Status: AC
  Filled 2016-02-03: qty 10

## 2016-02-03 MED ORDER — ROCURONIUM BROMIDE 10 MG/ML (PF) SYRINGE
PREFILLED_SYRINGE | INTRAVENOUS | Status: DC | PRN
  Administered 2016-02-03 (×2): 5 mg via INTRAVENOUS
  Administered 2016-02-03: 10 mg via INTRAVENOUS
  Administered 2016-02-03: 40 mg via INTRAVENOUS
  Administered 2016-02-03: 10 mg via INTRAVENOUS
  Administered 2016-02-03: 5 mg via INTRAVENOUS

## 2016-02-03 MED ORDER — SODIUM CHLORIDE 0.9 % IJ SOLN
INTRAMUSCULAR | Status: AC
  Filled 2016-02-03: qty 50

## 2016-02-03 MED ORDER — PHENYLEPHRINE HCL 10 MG/ML IJ SOLN
INTRAVENOUS | Status: DC | PRN
  Administered 2016-02-03: 25 ug/min via INTRAVENOUS

## 2016-02-03 MED ORDER — OXYCODONE HCL 5 MG PO TABS
5.0000 mg | ORAL_TABLET | ORAL | Status: DC | PRN
  Administered 2016-02-03: 5 mg via ORAL
  Filled 2016-02-03: qty 1

## 2016-02-03 MED ORDER — ONDANSETRON HCL 4 MG/2ML IJ SOLN: 4.0000 mg | INTRAMUSCULAR | Status: DC | PRN

## 2016-02-03 MED ORDER — CIPROFLOXACIN IN D5W 400 MG/200ML IV SOLN
INTRAVENOUS | Status: AC
  Filled 2016-02-03: qty 200

## 2016-02-03 MED ORDER — DIPHENHYDRAMINE HCL 12.5 MG/5ML PO ELIX: 12.5000 mg | ORAL_SOLUTION | Freq: Four times a day (QID) | ORAL | Status: DC | PRN

## 2016-02-03 SURGICAL SUPPLY — 51 items
ADH SKN CLS APL DERMABOND .7 (GAUZE/BANDAGES/DRESSINGS) ×1
CATH FOLEY 2WAY SLVR 18FR 30CC (CATHETERS) ×3 IMPLANT
CATH TIEMANN FOLEY 18FR 5CC (CATHETERS) ×3 IMPLANT
CHLORAPREP W/TINT 26ML (MISCELLANEOUS) ×3 IMPLANT
CLIP LIGATING HEM O LOK PURPLE (MISCELLANEOUS) ×6 IMPLANT
COVER SURGICAL LIGHT HANDLE (MISCELLANEOUS) ×3 IMPLANT
COVER TIP SHEARS 8 DVNC (MISCELLANEOUS) ×1 IMPLANT
COVER TIP SHEARS 8MM DA VINCI (MISCELLANEOUS) ×2
CUTTER ECHEON FLEX ENDO 45 340 (ENDOMECHANICALS) ×3 IMPLANT
DECANTER SPIKE VIAL GLASS SM (MISCELLANEOUS) ×3 IMPLANT
DERMABOND ADVANCED (GAUZE/BANDAGES/DRESSINGS) ×2
DERMABOND ADVANCED .7 DNX12 (GAUZE/BANDAGES/DRESSINGS) ×1 IMPLANT
DRAPE ARM DVNC X/XI (DISPOSABLE) ×4 IMPLANT
DRAPE COLUMN DVNC XI (DISPOSABLE) ×1 IMPLANT
DRAPE DA VINCI XI ARM (DISPOSABLE) ×8
DRAPE DA VINCI XI COLUMN (DISPOSABLE) ×2
DRAPE SURG IRRIG POUCH 19X23 (DRAPES) ×3 IMPLANT
DRSG TEGADERM 4X4.75 (GAUZE/BANDAGES/DRESSINGS) ×3 IMPLANT
ELECT REM PT RETURN 9FT ADLT (ELECTROSURGICAL) ×3
ELECTRODE REM PT RTRN 9FT ADLT (ELECTROSURGICAL) ×1 IMPLANT
GAUZE SPONGE 2X2 8PLY STRL LF (GAUZE/BANDAGES/DRESSINGS) ×1 IMPLANT
GLOVE BIO SURGEON STRL SZ 6.5 (GLOVE) ×2 IMPLANT
GLOVE BIO SURGEONS STRL SZ 6.5 (GLOVE) ×1
GLOVE BIOGEL M STRL SZ7.5 (GLOVE) ×15 IMPLANT
GOWN STRL REUS W/TWL LRG LVL3 (GOWN DISPOSABLE) ×6 IMPLANT
GOWN STRL REUS W/TWL XL LVL3 (GOWN DISPOSABLE) ×15 IMPLANT
HOLDER FOLEY CATH W/STRAP (MISCELLANEOUS) ×3 IMPLANT
IRRIG SUCT STRYKERFLOW 2 WTIP (MISCELLANEOUS) ×3
IRRIGATION SUCT STRKRFLW 2 WTP (MISCELLANEOUS) ×1 IMPLANT
IV LACTATED RINGERS 1000ML (IV SOLUTION) IMPLANT
PACK ROBOT UROLOGY CUSTOM (CUSTOM PROCEDURE TRAY) ×3 IMPLANT
PAD POSITIONING PINK XL (MISCELLANEOUS) ×3 IMPLANT
PORT ACCESS TROCAR AIRSEAL 12 (TROCAR) ×1 IMPLANT
PORT ACCESS TROCAR AIRSEAL 5M (TROCAR) ×2
RELOAD GREEN ECHELON 45 (STAPLE) ×3 IMPLANT
SEAL CANN UNIV 5-8 DVNC XI (MISCELLANEOUS) ×4 IMPLANT
SEAL XI 5MM-8MM UNIVERSAL (MISCELLANEOUS) ×8
SET TRI-LUMEN FLTR TB AIRSEAL (TUBING) ×3 IMPLANT
SOLUTION ELECTROLUBE (MISCELLANEOUS) ×3 IMPLANT
SPONGE GAUZE 2X2 STER 10/PKG (GAUZE/BANDAGES/DRESSINGS) ×2
SUT ETHILON 3 0 PS 1 (SUTURE) ×3 IMPLANT
SUT MNCRL AB 4-0 PS2 18 (SUTURE) ×6 IMPLANT
SUT VIC AB 0 CT1 27 (SUTURE) ×3
SUT VIC AB 0 CT1 27XBRD ANTBC (SUTURE) ×1 IMPLANT
SUT VIC AB 2-0 SH 27 (SUTURE) ×3
SUT VIC AB 2-0 SH 27X BRD (SUTURE) ×1 IMPLANT
SUT VICRYL 0 UR6 27IN ABS (SUTURE) ×6 IMPLANT
SUT VLOC BARB 180 ABS3/0GR12 (SUTURE) ×6
SUTURE VLOC BRB 180 ABS3/0GR12 (SUTURE) ×2 IMPLANT
TOWEL OR NON WOVEN STRL DISP B (DISPOSABLE) ×3 IMPLANT
WATER STERILE IRR 1500ML POUR (IV SOLUTION) IMPLANT

## 2016-02-03 NOTE — Anesthesia Preprocedure Evaluation (Addendum)
Anesthesia Evaluation  Patient identified by MRN, date of birth, ID band Patient awake    Reviewed: Allergy & Precautions, NPO status , Patient's Chart, lab work & pertinent test results  Airway Mallampati: II  TM Distance: >3 FB Neck ROM: Full    Dental  (+) Dental Advisory Given, Loose, Poor Dentition, Missing, Chipped,    Pulmonary neg pulmonary ROS,    Pulmonary exam normal breath sounds clear to auscultation       Cardiovascular hypertension, Pt. on medications Normal cardiovascular exam Rhythm:Regular Rate:Normal     Neuro/Psych negative neurological ROS  negative psych ROS   GI/Hepatic negative GI ROS, (+) Hepatitis -  Endo/Other  negative endocrine ROS  Renal/GU negative Renal ROS  negative genitourinary   Musculoskeletal negative musculoskeletal ROS (+)   Abdominal   Peds negative pediatric ROS (+)  Hematology negative hematology ROS (+)   Anesthesia Other Findings   Reproductive/Obstetrics negative OB ROS                           Anesthesia Physical Anesthesia Plan  ASA: II  Anesthesia Plan: General   Post-op Pain Management:    Induction: Intravenous  Airway Management Planned: Oral ETT  Additional Equipment:   Intra-op Plan:   Post-operative Plan: Extubation in OR  Informed Consent: I have reviewed the patients History and Physical, chart, labs and discussed the procedure including the risks, benefits and alternatives for the proposed anesthesia with the patient or authorized representative who has indicated his/her understanding and acceptance.   Dental advisory given  Plan Discussed with: CRNA  Anesthesia Plan Comments:         Anesthesia Quick Evaluation

## 2016-02-03 NOTE — Transfer of Care (Signed)
Immediate Anesthesia Transfer of Care Note  Patient: Dennis Levine  Procedure(s) Performed: Procedure(s): XI ROBOTIC ASSISTED LAPAROSCOPIC RADICAL PROSTATECTOMY (N/A) PELVIC LYMPH NODE DISSECTION (N/A)  Patient Location: PACU  Anesthesia Type:General  Level of Consciousness: awake  Airway & Oxygen Therapy: Patient Spontanous Breathing and Patient connected to face mask oxygen  Post-op Assessment: Report given to RN and Post -op Vital signs reviewed and stable  Post vital signs: Reviewed and stable  Last Vitals:  Vitals:   02/03/16 1026  BP: (!) 128/94  Pulse: 85  Resp: 16  Temp: 36.5 C    Last Pain:  Vitals:   02/03/16 1026  TempSrc: Oral         Complications: No apparent anesthesia complications

## 2016-02-03 NOTE — Anesthesia Procedure Notes (Signed)
Procedure Name: Intubation Date/Time: 02/03/2016 1:03 PM Performed by: Cynda Familia Pre-anesthesia Checklist: Patient identified, Emergency Drugs available, Suction available and Patient being monitored Patient Re-evaluated:Patient Re-evaluated prior to inductionOxygen Delivery Method: Circle System Utilized Preoxygenation: Pre-oxygenation with 100% oxygen Intubation Type: IV induction Ventilation: Mask ventilation without difficulty Laryngoscope Size: Miller and 2 Grade View: Grade I Tube type: Oral Number of attempts: 1 Airway Equipment and Method: Stylet Placement Confirmation: ETT inserted through vocal cords under direct vision,  positive ETCO2 and breath sounds checked- equal and bilateral Secured at: 22 cm Tube secured with: Tape Dental Injury: Teeth and Oropharynx as per pre-operative assessment  Comments: Smooth IV induction--  Intubation AM CRNA atraumatic--- teeth and mouth as preop--  Many missing teeth and bottom right tooth loose prior to laryngoscopy---  bilat BS [place]

## 2016-02-03 NOTE — Op Note (Signed)
Preoperative diagnosis:  1. Prostate Cancer   Postoperative diagnosis:  1. same   Procedure: 1. Robotic assisted laparoscopic radical prostatectomy 2. Bilateral pelvic lymph node dissection  Surgeon: Ardis Hughs, MD First Assistant: Debbrah Alar, PA  Anesthesia: General  Complications: None  Intraoperative findings: normal anatomy, right nerve spare, bilateral nodes  EBL: Minimal  Specimens:  #1.  Prostate and seminal vesicals #2.  Bilateral pelvic lymph nodes  Indication: Dennis Levine is a 65 y.o. patient with prostate cancer.  After reviewing the management options for treatment, he elected to proceed with the removal of his prostate. We have discussed the potential benefits and risks of the procedure, side effects of the proposed treatment, the likelihood of the patient achieving the goals of the procedure, and any potential problems that might occur during the procedure or recuperation. Informed consent has been obtained.  Description of procedure:  The patient was consented in the preoperative holding area. He was in brought back to the operating room placed the table in supine position. General anesthesia was then induced and endotracheal tube was inserted. He was then placed in dorsolithotomy position and placed in steep Trendelenburg. He was then prepped and draped in the routine sterile fashion. We, the first assistant and I, then began by making a 8 mm incision supraumbilical midline incision the skin down through into the peritoneum. Then placed a 8 mm trocar. I then inflated the abdomen and inserted the 0 robotic lens. We then placed 2 additional a 8 millimeter trochars in the patient's left lower abdomen proximally 9 cm apart and 2 trochars on the patient's right lower abdomen, one was an 8 mm trocar and the one most lateral was a 12 mm trocar which was used as the assistant port. A 5 mm trocar was placed by triangulating the 2 right lateral ports as a second  assistant port. These ports were all placed under visual guidance. Once the ports were noted to be satisfactory position the robot was docked. We started with the 0 lens, monopolar scissors in the right hand and the [place] forceps the left hand as well as a fenestrated grasper as the third arm on the left-hand side.   We, the first assistant and I,  began our dissection the posterior plane incising the peritoneum at the level of the vas deferens. Isolated the left vas deferens and dissected it proximally towards the spermatic cord for 5 cm prior to ligating it. Then used this as traction to isolate the left the seminal vesicle which was then undressed bluntly and completely dissected out, all vessels were cauterized with a combination of bipolar and the monopolar scissors. We then turned our attention to the right side and similarly dissected out the right vas deferens and seminal vesicle. Once the SVs had been freed, we turned our attention to the posterior plane and bluntly dissected the tissue between the rectum and the posterior wall of the prostate bluntly out towards the apex.    At this point the bladder was taken down starting at the urachal remnant with a combination of both blunt dissection and sharp dissection using monopolar cautery the bladder was dropped down in the usual fashion to the medial umbilical ligaments laterally and the dorsal vein of the prostate anteriorly creating our space of Retzius. We then turned our attention to the endopelvic fascia which was incised laterally starting on the patient's right-hand side the levator muscles were pushed off the prostate laterally up towards the dorsal vein complex  on the right-hand side. This process was then repeated on the left-hand side and a nice notch was created for the dorsal vein. I then used a 33mm stapler to staple the dorsal vein.   We,the first assistant and I, then located the bladder neck at the vesicoprostatic junction and using  the monopolar scissors dissected down through the perivesical tissues and the bladder neck down to the prostatic urethra. The catheter was then deflated and pulled through our urethral opening and then used to retract the prostate anteriorly for the posterior bladder neck dissection. Once through the bladder neck and into the posterior plane of the prostate, the SVs were brought through the opening. The rightt pedicle was then isolated and systematically ligated with Weck clips and scissors. The nerve bundle was then peeled off the posterior lateral aspect of the prostate and bluntly dissected away off the prostate.  This was then repeated on the left side - dissection was taken wide including the nerve.   I then came down through the dorsal venous complex anteriorly down to the membranous urethra using the monopolar. Once down to the urethra, it was transected sharply and the apex of the prostate was then dissected off the levator and rectourethralis muscles. Once the apex of the prostate had been dissected free we came back to the base of the prostate and bluntly push the rectum and nerve vascular bundle off the prostate the patient's left and used clips on the patient's right to free the prostate. Once the prostate was free it was placed off to the side. The pelvis was then irrigated with normal saline and noted to be relatively hemostatic.  Attention was then turned to the right pelvic sidewall. The fibrofatty tissue between the external iliac vein, confluence of the iliac vessels, hypogastric artery, and Cooper's ligament was dissected free from the pelvic sidewall with care to preserve the obturator nerve. Weck clips were used for lymphostasis and hemostasis. An identical procedure was performed on the contralateral side and the lymphatic packets were removed for permanent pathologic analysis.  The prostate and both lymph node tissues were placed in the Endo Catch bag and the string brought to the 5 mm  port.    The vesicourethral anastomosis was then completed with 2 interlocking 3-0 V. lock sutures running the anastomosis in the 6:00 position to the 12:00 position on each side and then tying it off on the top. The final catheter was then passed through the patient's urethra and into the bladder and 120 cc was instilled into the bladder to test the anastomosis. As there was no leak a 39 [place] Blake drain was passed through the left lateral port and placed around the vesicourethral anastomosis. A 12 mm assistant port on the right lateral side was then closed with 0 Vicryl with the help of the [employer] needle. The 12 mm midline infraumbilical incision was then extended another centimeter taken down and the fascia opened to remove the Endo Catch bag with the prostate specimen. The fascia was then closed with a 0 Vicryl and all skin ports were closed with 4-0 Monocryl in a subcutaneous fashion. Dermabond glue was then applied to the incisions. The drain was then secured to the skin with a 0 nylon stitch and dressing applied.   At the end of the case all laps needles and sponges had been accounted for. There no immediate complications. The patient returned to the PACU in stable condition.

## 2016-02-03 NOTE — H&P (Signed)
Dennis Levine is a 65 year-old male established patient who is here for follow up for further evaluation of his elevated PSA.  His last PSA was performed 08/31/2015. The last PSA value was 26.36.   Patient does not have a family history of prostate cancer. The patient states he does not take 5 alpha reductase inhibitor medication. He has had a prostate biopsy.   Adenocarcinoma of the prostate:  Pathology: Adenocarcinoma Gleason score 4+3 = 7 in 2 cores and 3+4 = 7 in 2 cores.  PSA at the time of diagnosis: 26.36  Bone Scan 11/2015 - negative for metastatic bone lesions  CT pelvis 11/2015 - negative for pelvic LAD  Stage: T1c  Prostate Vol 28gm   Partin table results: his probability of organ confined disease is 15% with an 81% probability of extracapsular extension, a 16% probability of seminal vesicle involvement and an 18% probability of lymph node involvement. His 5 and 10 year progression free probability with radical prostatectomy is 33% and 21% respectively   Patient with history of rectal prolapse, present for a long time. Asymptomatic, no associated with constipation or difficulty with bowel movements.   Patient has relatively good erectile function baseline, but does use medication to help augment them in order to penetrate his partner.   No changes to his PMH since last OV.   PMH significant for HTN.  No past surgeries.     ALLERGIES: None   MEDICATIONS: Amlodipine Besylate 10 mg tablet     GU PSH: Locm 300-399Mg /Ml Iodine,1Ml - 11/20/2015 Prostate Needle Biopsy - 11/03/2015    NON-GU PSH: Surgical Pathology, Gross And Microscopic Examination For Prostate Needle - 11/03/2015    GU PMH: ED, arterial insufficiency (Stable), This is mild and not warranting any form of treatment. - October 24, 2015 Prostate Cancer      PMH Notes: Adenocarcinoma of the prostate:  Pathology: Adenocarcinoma Gleason score 4+3 = 7 in 2 cores and 3+4 = 7 in 2 cores.  PSA at the time of diagnosis: 26.36   Bone Scan 11/2015 - negative for metastatic bone lesions  CT pelvis 11/2015 - negative for pelvic LAD  Stage: T1c   Partin table results: his probability of organ confined disease is 15% with an 81% probability of extracapsular extension, a 16% probability of seminal vesicle involvement and an 18% probability of lymph node involvement. His 5 and 10 year progression free probability with radical prostatectomy is 33% and 21% respectively.     NON-GU PMH: Rectal prolapse, This is mild to moderate in severity and he did recommend further evaluation by a gastroenterologist. - 10-24-15    FAMILY HISTORY: Death of family member - Mother   SOCIAL HISTORY: Marital Status: Single Current Smoking Status: Patient does not smoke anymore.  Does not use drugs. Drinks 2 caffeinated drinks per day.    REVIEW OF SYSTEMS:    GU Review Male:   Patient denies frequent urination, hard to postpone urination, burning/ pain with urination, get up at night to urinate, leakage of urine, stream starts and stops, trouble starting your stream, have to strain to urinate , erection problems, and penile pain.  Gastrointestinal (Upper):   Patient denies nausea, vomiting, and indigestion/ heartburn.  Gastrointestinal (Lower):   Patient denies diarrhea and constipation.  Constitutional:   Patient denies fever, night sweats, weight loss, and fatigue.  Skin:   Patient denies skin rash/ lesion and itching.  Eyes:   Patient denies blurred vision and double vision.  Ears/ Nose/ Throat:  Patient denies sore throat and sinus problems.  Hematologic/Lymphatic:   Patient denies swollen glands and easy bruising.  Cardiovascular:   Patient denies leg swelling and chest pains.  Respiratory:   Patient denies cough and shortness of breath.  Endocrine:   Patient denies excessive thirst.  Musculoskeletal:   Patient denies back pain and joint pain.  Neurological:   Patient denies headaches and dizziness.  Psychologic:   Patient denies  depression and anxiety.   VITAL SIGNS:      01/04/2016 03:56 PM  Weight 138 lb / 62.6 kg  Height 70 in / 177.8 cm  BP 149/89 mmHg  Pulse 79 /min  Temperature 97.4 F / 36 C  BMI 19.8 kg/m   GU PHYSICAL EXAMINATION:    Anus and Perineum: mild-moderate prolapsed rectum   Prostate: Prostate about 40 grams. Left lobe normal consistency, right lobe normal consistency. Symmetrical lobes. No prostate nodule. Left lobe no tenderness, right lobe no tenderness.   Seminal Vesicles: Nonpalpable.  Sphincter Tone: Normal sphincter. No rectal tenderness. No rectal mass.    MULTI-SYSTEM PHYSICAL EXAMINATION:    Constitutional: Well-nourished. No physical deformities. Normally developed. Good grooming.  Neck: Neck symmetrical, not swollen. Normal tracheal position.  Respiratory: No labored breathing, no use of accessory muscles. CTA-B  Cardiovascular: Normal temperature, normal extremity pulses, no swelling, no varicosities. RRR  Lymphatic: No enlargement of neck, axillae, groin.  Skin: No paleness, no jaundice, no cyanosis. No lesion, no ulcer, no rash.  Neurologic / Psychiatric: Oriented to time, oriented to place, oriented to person. No depression, no anxiety, no agitation.  Gastrointestinal: No mass, no tenderness, no rigidity, non obese abdomen.  Eyes: Normal conjunctivae. Normal eyelids.  Ears, Nose, Mouth, and Throat: Left ear no scars, no lesions, no masses. Right ear no scars, no lesions, no masses. Nose no scars, no lesions, no masses. Normal hearing. Normal lips.  Musculoskeletal: Normal gait and station of head and neck.     PAST DATA REVIEWED:  Source Of History:  Patient  Lab Test Review:   PSA  Records Review:   Pathology Reports, Previous Doctor Records   PROCEDURES:          Urinalysis - 81003 Dipstick Dipstick Cont'd  Specimen: Voided Bilirubin: Neg  Color: Yellow Ketones: Neg  Appearance: Clear Blood: Neg  Specific Gravity: 1.015 Protein: Neg  pH: 6.0 Urobilinogen: 0.2   Glucose: Neg Nitrites: Neg    Leukocyte Esterase: Neg    ASSESSMENT:      ICD-10 Details  1 GU:   Prostate Cancer - C61 High risk CaP with PSA >20 and gleason 4+3=7. Patient scheduled to undergo a non-nerve sparing radical prostatectomy.  2   ED, arterial insufficiency - N52.01 Mild  3 NON-GU:   Rectal prolapse - K62.3    PLAN:           Schedule Return Visit: ASAP - Schedule Surgery, PT/OT Referral          Document Letter(s):  Created for Patient: Clinical Summary    We discussed prostatectomy and specifically robotic prostatectomy with bilateral pelvic lymphadenectomy. I showed the patient on their abdomen the approximately 6 small incision (trocar) sites as well as presumed extraction sites with robotic approach as well as possible open incision sites should open conversion be necessary. We discussed peri-operative risks including bleeding, infection, deep vein thrombosis, pulmonary embolism, compartment syndrome, neuropathy / neuropraxia, heart attack, stroke, death, as well as long-term risks such as non-cure / need for additional therapy. We specifically  addressed that the procedure would compromise urinary control leading to stress incontinence which typically resolves with time and pelvic rehabilitation (Kegel's, etc..), but can sometimes be permanent and require additional therapy including surgery. We also specifically addressed sexual side-effects including significant erectile dysfunction which typically partially resolves with time but can also be permanent and require additional therapy including surgery.   We discussed the typical hospital course including usual 1-2 night hospitalization, discharge with foley catheter in place usually for 1-2 weeks before voiding trial as well as usually 2 week recovery until able to perform most non-strenuous activity and 6 weeks until able to return to most jobs and more strenuous activity such as exercise.

## 2016-02-03 NOTE — Discharge Instructions (Signed)
Robot-Assisted Laparoscopic Prostatectomy, Care After Refer to this sheet in the next few weeks. These instructions provide you with information on caring for yourself after your procedure. Your caregiver also may give you more specific instructions. Your treatment has been planned according to current medical practices, but problems sometimes occur. Call your caregiver if you have any problems or questions after your procedure. [facility] When you leave the hospital, a thin, flexible tube for draining urine (urinary catheter) will extend from your bladder, through your penis, to a collection bag outside your body. The catheter will drain urine from your bladder. Your caregiver will tell you how to remove the collection bag when it is full and how to attach a new bag. Your caregiver will also tell you how to fasten the catheter to your leg. You should always keep the collection bag below your bladder. You will have a large bag to use at night and a small bag to use during the day. The small bag should be emptied about every 3 hours. You should clean the tip of your penis (where the catheter comes out) with soap and water at least 2 times a day. Your caregiver also may give you ointment to put on the tip. You will have the catheter until healing is complete (2-3 weeks). You will need to return to your caregiver to have the catheter removed. You may have cramping in your bladder or feel the need to urinate often (bladder spasm). This is common when a catheter is in place. You may even leak a little urine or blood when the catheter is in place. This is normal. It often happens after a bladder spasm. Once the catheter has been removed, control of urination will come back slowly. You may need to wear a pad to absorb any leakage for a little while after the catheter is removed. Incision Care You may have bandages over the cuts (incisions) on your belly. Your caregiver will tell you when  the bandages can be removed (typically, within a few days). Check the incisions every day for redness, pain, and swelling (inflammation), which are signs of infection. Use of Medicine Some pain is normal after this procedure. You may also have difficulty having bowel movements (constipation). Your caregiver may prescribe the use of pain medicine for the pain or laxatives or stool softeners for the constipation. However, only take over-the-counter and prescription medicine as your caregiver directs. Diet You may resume a normal diet right away. However, if you are constipated, drinking 6-8 glasses of liquid every day and including prune juice and high-fiber foods in your diet can help. Activities You will probably be able to go back to most activities in about 10 days. It may take a month before you can do everything you used to do. Get plenty of rest and do not participate in any strenuous activity. Do not lift anything heavy until your caregiver says it is okay. However, walking after an operation is important. Try to walk for a few minutes at least 6 times a day. When sitting down, try to first press your toes into the floor and then relax them as you are seated. This will help you maintain the circulation through the blood vessels in your legs and help to prevent any blood clots. Shower rather than bathe until you are fully healed. Before showering, remove your catheter from the collection bag while in the shower. Ask your caregiver when it is okay to resume taking baths.  Some men can return to sexual activity 2 weeks after the procedure. Make sure you talk to your caregiver before you resume sexual activity. SEEK MEDICAL CARE IF:  You have problems with your catheter.  You develop frequent bladder spasms.  You become constipated.  You develop a fever. SEEK IMMEDIATE MEDICAL CARE IF:   Your pain and swelling get worse.  Your catheter becomes blocked or falls out.  You notice bright red  blood or clots in your urine.  You have inflammation around your incisions.  Your incisions open.  Fluid or blood starts to leak from incisions after they have healed. MAKE SURE YOU:  Understand these instructions.  Will watch your condition.  Will get help right away if you are not doing well or get worse.   This information is not intended to replace advice given to you by your health care provider. Make sure you discuss any questions you have with your health care provider.   Document Released: 12/21/2011 Document Revised: 04/18/2014 Document Reviewed: 12/21/2011 Elsevier Interactive Patient Education [employer].

## 2016-02-03 NOTE — Discharge Summary (Signed)
Physician Discharge Summary  Patient ID: Dennis Levine MRN: [id number] DOB/AGE: [birth year] 65 y.o.  Admit date: 02/03/2016 Discharge date: 02/04/2016  Admission Diagnoses: Prostate cancer  Discharge Diagnoses: Prostate cancer  Discharged Condition: good  Hospital Course:   Jermy Couper underwent robot assisted laparoscopic prostatectomy on 02/03/16 with Dr. Louis Meckel. The patient tolerated the procedure well, was extubated in the OR and taken to the recovery unit for routine postoperative care. They were then transferred to the floor.   By POD1 he had met the usual goals for discharge including ambulating at a preoperative capacity, having pain controlled with PO PRN medications, and tolerating a regular diet.  The patient will follow up with [facility] in 1 weeks.    Consults: None  Significant Diagnostic Studies: labs:   Recent Labs  02/01/16 1400 02/03/16 1726 02/04/16 0530  WBC 5.2  --   --   HGB 14.6 14.5 12.3*  HCT 43.4 44.9 37.9*    Recent Labs  02/01/16 1400 02/03/16 1726  NA 133* 136  K 4.0 4.9  CL 101 102  CO2 22 19*  GLUCOSE 85 84  BUN 11 17  CREATININE 0.56* 0.78  CALCIUM 9.2 9.0   No results for input(s): LABPT, INR in the last 72 hours.  Recent Labs  02/01/16 1400  PSA 11.77*   No results for input(s): LABURIN in the last 72 hours. Results for orders placed or performed during the hospital encounter of 02/01/16  Urine culture     Status: None   Collection Time: 02/01/16  1:30 PM  Result Value Ref Range Status   Specimen Description URINE, CLEAN CATCH  Final   Special Requests NONE  Final   Culture NO GROWTH Performed at [facility]   Final   Report Status 02/03/2016 FINAL  Final     Treatments: surgery: as noted above  Discharge Exam: Blood pressure (!) 145/74, pulse 66, temperature 97.9 F (36.6 C), temperature source Oral, resp. rate 20, height '5\' 10"'  (1.778 m), weight 60.8 kg (134 lb), SpO2 99 %. General:   well-developed and well-nourished in NAD, lying in bed, alert & oriented, pleasant HEENT: [place]/AT, EOMI, sclera anicteric, hearing grossly intact, no nasal discharge, MMM Respiratory: nonlabored respirations, satting well on RA, symmetrical chest rise Cardiovascular: pulse regular rate & rhythm Abdominal: soft, NTTP, nondistended, surgical incisions c/d/i without signs of exudate/erythema GU: foley draining clear urine Extremities: warm, well-perfused, no c/c/e Neuro: no focal deficits   Disposition: 01-Home or Self Care     Medication List    TAKE these medications   amLODipine 10 MG tablet Commonly known as:  NORVASC Take 1 tablet (10 mg total) by mouth daily.   docusate sodium 100 MG capsule Commonly known as:  COLACE Take 1 capsule (100 mg total) by mouth 2 (two) times daily.   oxyCODONE 5 MG immediate release tablet Commonly known as:  Oxy IR/ROXICODONE Take 1-2 tablets (5-10 mg total) by mouth every 4 (four) hours as needed for moderate pain.   sulfamethoxazole-trimethoprim 800-160 MG tablet Commonly known as:  BACTRIM DS,SEPTRA DS Take 1 tablet by mouth 2 (two) times daily. Start taking one day prior to your appointment for your first follow-up and catheter removal.  Continue taking for three days.      Follow-up Information    Ardis Hughs, MD Follow up on 02/11/2016.   Specialty:  Urology Why:  3:45pm Contact information: [place]  [id number] [phone]  Signed: Louis Meckel W 02/04/2016, 11:34 AM

## 2016-02-04 ENCOUNTER — Encounter (HOSPITAL_COMMUNITY): Payer: Self-pay | Admitting: Urology

## 2016-02-04 LAB — HEMOGLOBIN AND HEMATOCRIT, BLOOD
HEMATOCRIT: 37.9 % — AB (ref 39.0–52.0)
HEMOGLOBIN: 12.3 g/dL — AB (ref 13.0–17.0)

## 2016-02-04 MED ORDER — SULFAMETHOXAZOLE-TRIMETHOPRIM 800-160 MG PO TABS: 1.0000 | ORAL_TABLET | Freq: Two times a day (BID) | ORAL | 0 refills | Status: DC

## 2016-02-04 MED ORDER — DOCUSATE SODIUM 100 MG PO CAPS: 100.0000 mg | ORAL_CAPSULE | Freq: Two times a day (BID) | ORAL | 0 refills | Status: DC

## 2016-02-04 MED ORDER — OXYCODONE HCL 5 MG PO TABS: 5.0000 mg | ORAL_TABLET | ORAL | 0 refills | Status: DC | PRN

## 2016-02-04 NOTE — Care Management Note (Signed)
Case Management Note  Patient Details  Name: Dennis Levine MRN: [id number] Date of Birth: Jul 11, 1950  Subjective/Objective:   65 y/o m admitted w/Prostate Ca. S/p lap rad prostatectomy. From home.                 Action/Plan:d/c plan home.   Expected Discharge Date:                 Expected Discharge Plan:  Home/Self Care  In-House Referral:     Discharge planning Services  CM Consult  Post Acute Care Choice:    Choice offered to:     DME Arranged:    DME Agency:     HH Arranged:    HH Agency:     Status of Service:  In process, will continue to follow  If discussed at Long Length of Stay Meetings, dates discussed:    Additional Comments:  Dessa Phi, RN 02/04/2016, 11:10 AM

## 2016-02-04 NOTE — Progress Notes (Signed)
Pt to be discharged to home this afternoon. Home Care Instructions for Robotic Prostatectomy gone over with Pt. Leg Bag and Foley Bag teaching and demonstrations done with Pt. Pt able to return demonstration but needed reinforcing teaching and return demonstrations. All other discharge teaching and instructions gone over with Pt. Pt does state he does live alone with no family in this area. Encouraged Pt to contact a friend to check in on him and pt states " I will try but not sure if they will"

## 2016-02-04 NOTE — Care Management Note (Signed)
Case Management Note  Patient Details  Name: Dennis Levine MRN: [id number] Date of Birth: 11-06-1950  Subjective/Objective:                    Action/Plan:d/c home no needs or orders.   Expected Discharge Date:                 Expected Discharge Plan:  Home/Self Care  In-House Referral:     Discharge planning Services  CM Consult  Post Acute Care Choice:    Choice offered to:     DME Arranged:    DME Agency:     HH Arranged:    [place] Agency:     Status of Service:  Completed, signed off  If discussed at [employer] of Stay Meetings, dates discussed:    Additional Comments:  Dessa Phi, RN 02/04/2016, 12:55 PM

## 2016-02-04 NOTE — Progress Notes (Signed)
UROLOGY PROGRESS NOTES  Assessment/Plan: Dennis Levine is a 65 y.o. male with a history of hepatitis C, HTN and prostate cancer s/p RALP and pelvic node dissection 02/03/16 with Dr. Louis Meckel.    Interval/Plan: Saline lock. OOB. Continue clears. Suppository. Doing well postop.   Neuro: - pain control with tylenol, dilaudid, oxycodone - prn benadryl  Respiratory: wean oxygen to room air  CV: HDS - home amlodipine 10mg  po qd  FEN/GI: - d5 1/2ns @ 125cc/hr, replete lytes prn, clears diet; continue clears, saline lock - bowel regimen with colace/senna - PRN zofran   GU: - monitor UOP - Foley catheter in place draining yellow/orange urine - creatinine 0.78  Heme/ID: - afebrile, stable - Hb 12.3, continue to monitor  PPx: OOB/IS  Dispo: Floor status   Subjective: No n/v. Pain 2/10. + oob. No flatus yet.   Objective:  Vital signs in last 24 hours: Temp:  [97.7 F (36.5 C)-98.3 F (36.8 C)] 97.7 F (36.5 C) (10/26 0300) Pulse Rate:  [67-85] 67 (10/26 0300) Resp:  [10-18] 18 (10/26 0300) BP: (126-160)/(36-98) 126/69 (10/26 0300) SpO2:  [97 %-100 %] 100 % (10/26 0300) Weight:  [134 lb (60.8 kg)] 134 lb (60.8 kg) (10/25 1830)  10/25 0701 - 10/26 0700 In: 4812.5 [P.O.:600; I.V.:2812.5; IV Piggyback:1400] Out: [id number] [Urine:1175; Drains:77; Blood:100]    Physical Exam:  General:  well-developed and well-nourished thin AAM in NAD, lying in bed, alert & oriented, pleasant HEENT: [place]/AT, EOMI, sclera anicteric, hearing grossly intact, no nasal discharge, MMM Respiratory: nonlabored respirations, satting well on RA, symmetrical chest rise Cardiovascular: pulse regular rate & rhythm Abdominal: soft, mildly & appropriately TTP, nondistended, surgical incisions c/d/i without signs of exudate/erythema GU: Foley catheter draining yellow/orange urine Extremities: warm, well-perfused, no c/c/e Neuro: no focal deficits   Data Review: Results for orders placed or performed  during the hospital encounter of 02/03/16 (from the past 24 hour(s))  Hemoglobin and hematocrit, blood     Status: None   Collection Time: 02/03/16  5:26 PM  Result Value Ref Range   Hemoglobin 14.5 13.0 - 17.0 g/dL   HCT 44.9 39.0 - [id number] %  Basic metabolic panel     Status: Abnormal   Collection Time: 02/03/16  5:26 PM  Result Value Ref Range   Sodium 136 135 - 145 mmol/L   Potassium 4.9 3.5 - 5.1 mmol/L   Chloride 102 101 - 111 mmol/L   CO2 19 (L) 22 - 32 mmol/L   Glucose, Bld 84 65 - 99 mg/dL   BUN 17 6 - 20 mg/dL   Creatinine, Ser 0.78 0.61 - 1.24 mg/dL   Calcium 9.0 8.9 - 10.3 mg/dL   GFR calc non Af Amer >60 >60 mL/min   GFR calc Af Amer >60 >60 mL/min   Anion gap 15 5 - 15  Hemoglobin and hematocrit, blood     Status: Abnormal   Collection Time: 02/04/16  5:30 AM  Result Value Ref Range   Hemoglobin 12.3 (L) 13.0 - 17.0 g/dL   HCT 37.9 (L) 39.0 - 52.0 %    Imaging: None

## 2016-02-05 NOTE — Anesthesia Postprocedure Evaluation (Signed)
Anesthesia Post Note  Patient: [name]  Procedure(s) Performed: Procedure(s) (LRB): XI ROBOTIC ASSISTED LAPAROSCOPIC RADICAL PROSTATECTOMY (N/A) PELVIC LYMPH NODE DISSECTION (N/A)  Patient location during evaluation: PACU Anesthesia Type: General Level of consciousness: awake Pain management: pain level controlled Vital Signs Assessment: post-procedure vital signs reviewed and stable Respiratory status: spontaneous breathing Cardiovascular status: stable Postop Assessment: no signs of nausea or vomiting Anesthetic complications: no    Last Vitals:  Vitals:   02/04/16 0945 02/04/16 0957  BP: (!) 144/71 (!) 145/74  Pulse: 66   Resp: 20   Temp: 36.6 C     Last Pain:  Vitals:   02/04/16 0945  TempSrc: Oral  PainSc:                  [name]

## 2016-02-10 ENCOUNTER — Ambulatory Visit: Payer: 59 | Admitting: Physician Assistant

## 2016-10-15 ENCOUNTER — Other Ambulatory Visit: Payer: Self-pay | Admitting: Family Medicine

## 2016-10-15 DIAGNOSIS — I10 Essential (primary) hypertension: Secondary | ICD-10-CM

## 2016-10-30 ENCOUNTER — Other Ambulatory Visit: Payer: Self-pay | Admitting: Family Medicine

## 2016-10-30 DIAGNOSIS — I10 Essential (primary) hypertension: Secondary | ICD-10-CM

## 2016-11-02 ENCOUNTER — Other Ambulatory Visit: Payer: Self-pay | Admitting: Family Medicine

## 2016-11-02 DIAGNOSIS — I10 Essential (primary) hypertension: Secondary | ICD-10-CM

## 2016-11-27 ENCOUNTER — Other Ambulatory Visit: Payer: Self-pay | Admitting: Family Medicine

## 2016-11-27 DIAGNOSIS — I10 Essential (primary) hypertension: Secondary | ICD-10-CM

## 2016-11-30 ENCOUNTER — Other Ambulatory Visit: Payer: Self-pay | Admitting: Family Medicine

## 2016-11-30 DIAGNOSIS — I10 Essential (primary) hypertension: Secondary | ICD-10-CM

## 2017-10-22 IMAGING — DX DG SHOULDER 2+V*L*
4 series · 4 of 4 positions shown · non-contrast
Comparison: None.

CLINICAL DATA: Left shoulder pain and left hand weakness. No recent
injury.

EXAM:
LEFT SHOULDER - 2+ VIEW

[shoulder grashey]
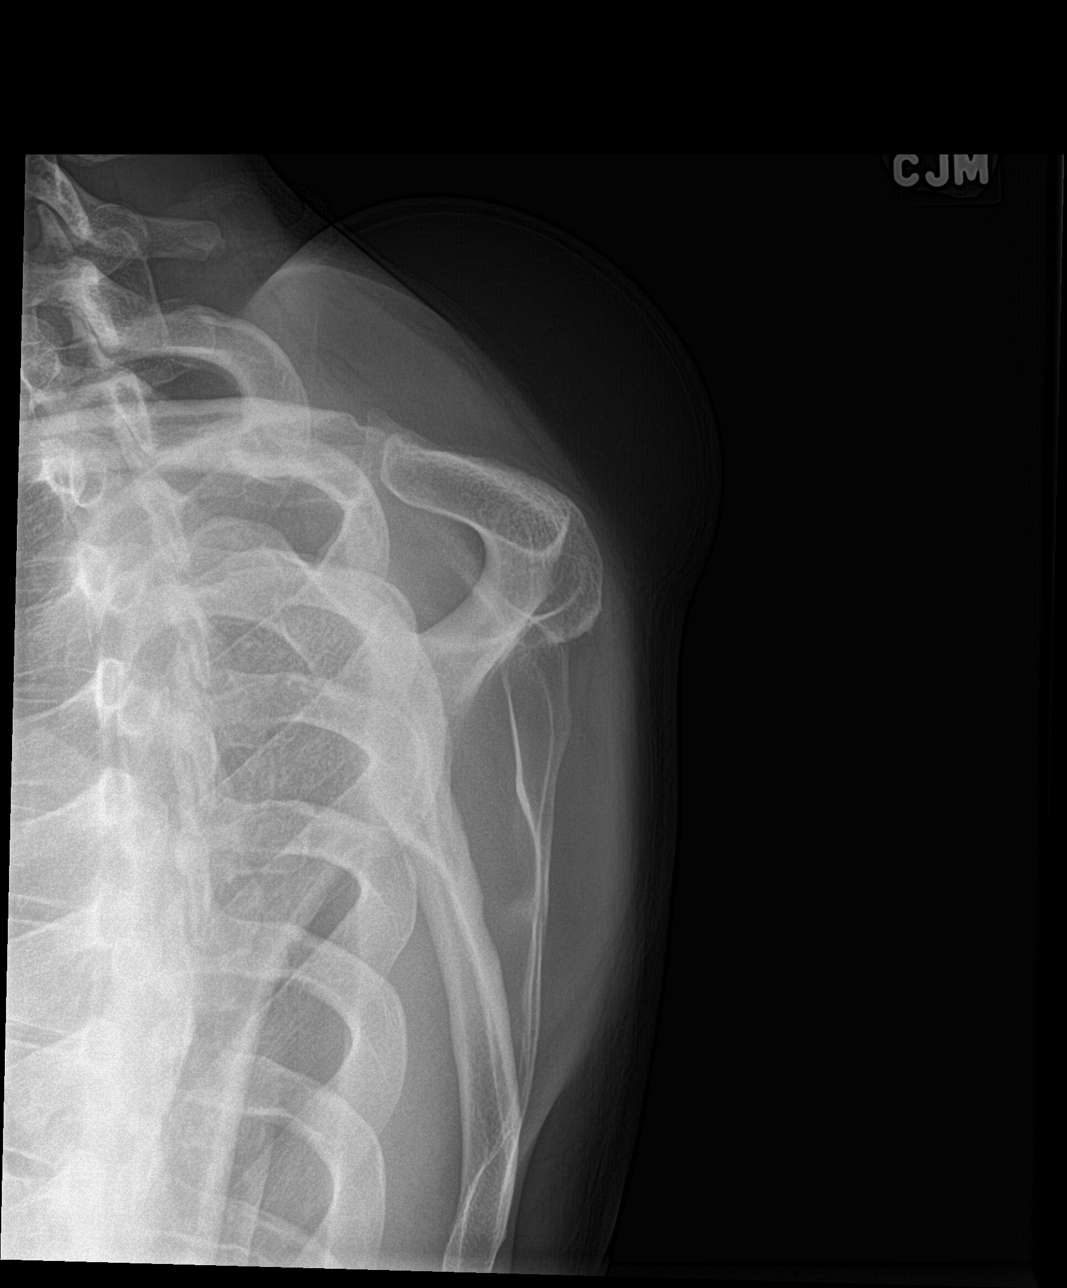

[shoulder y view]
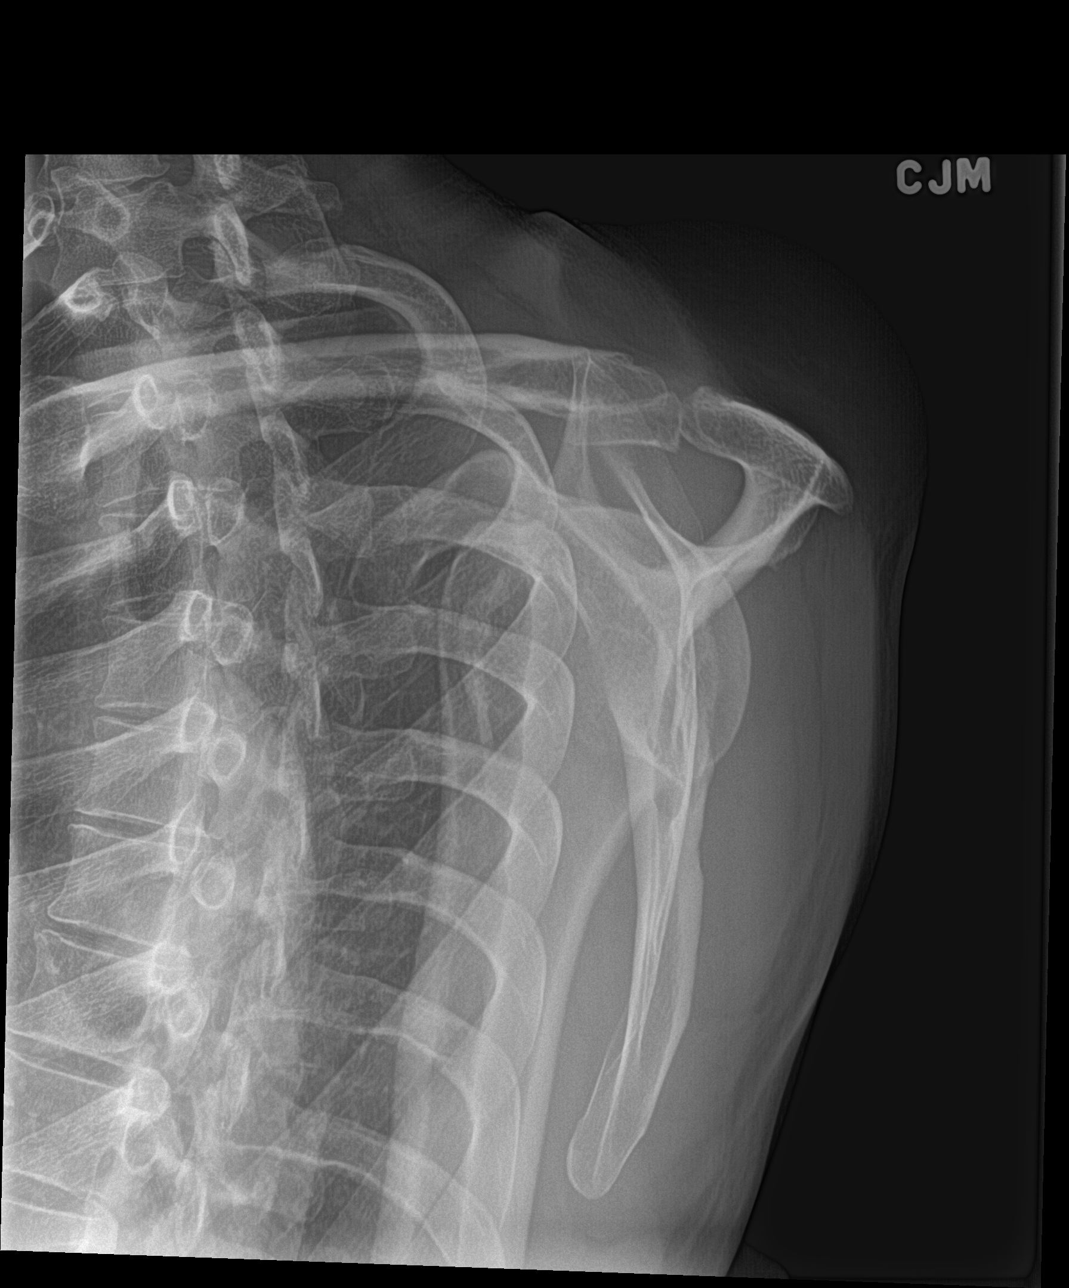

[shoulder axillary]
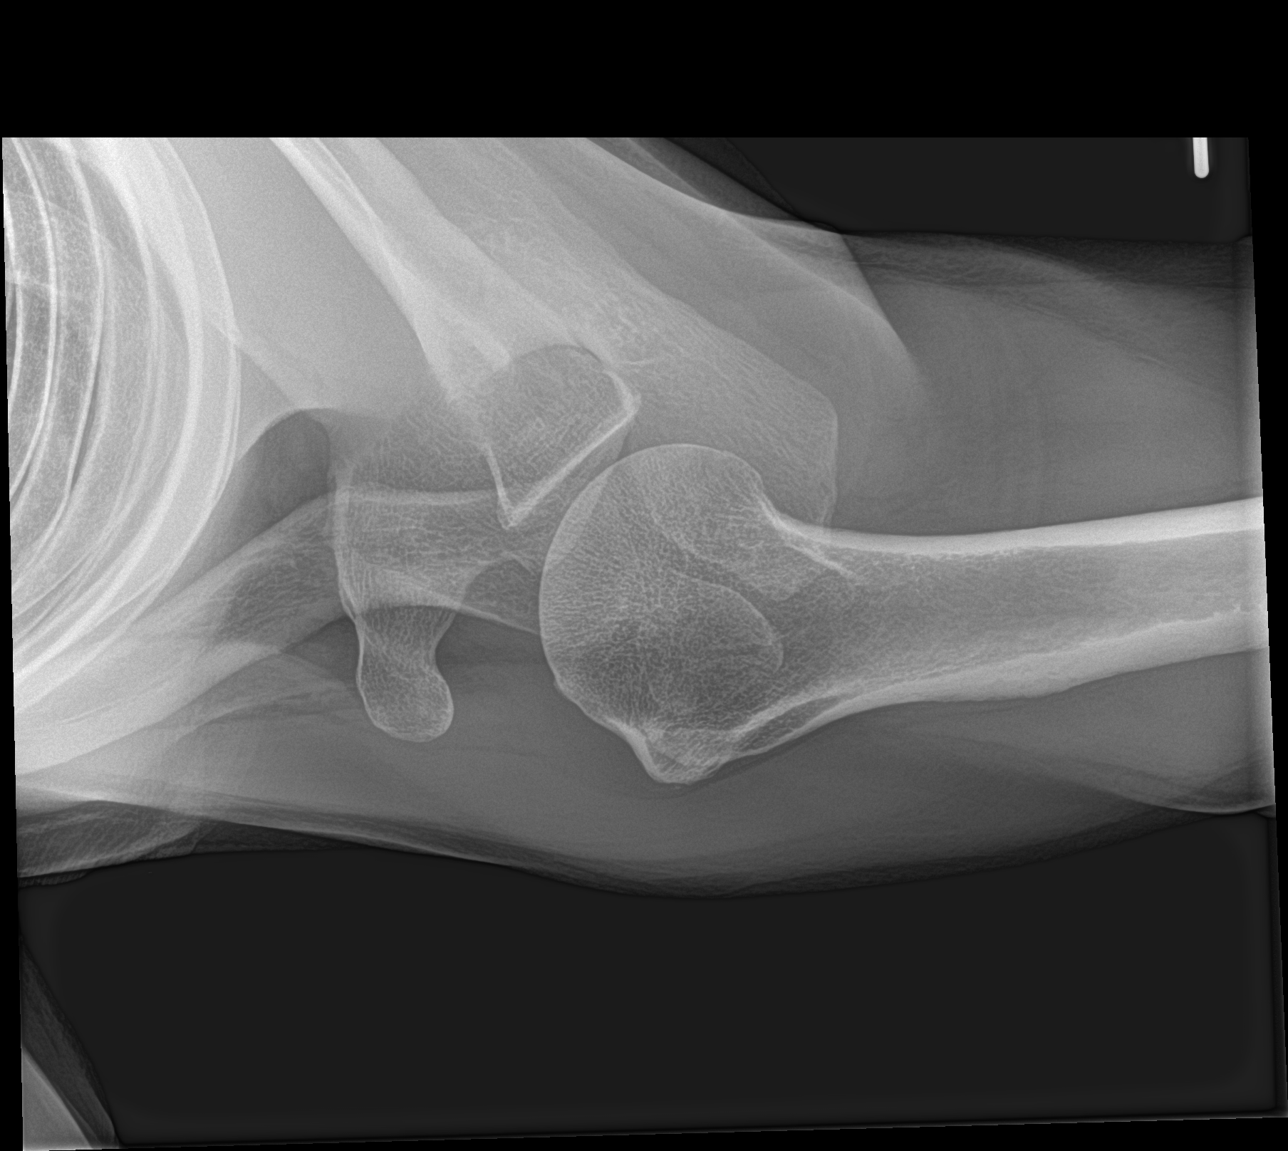

[shoulder ap neutral]
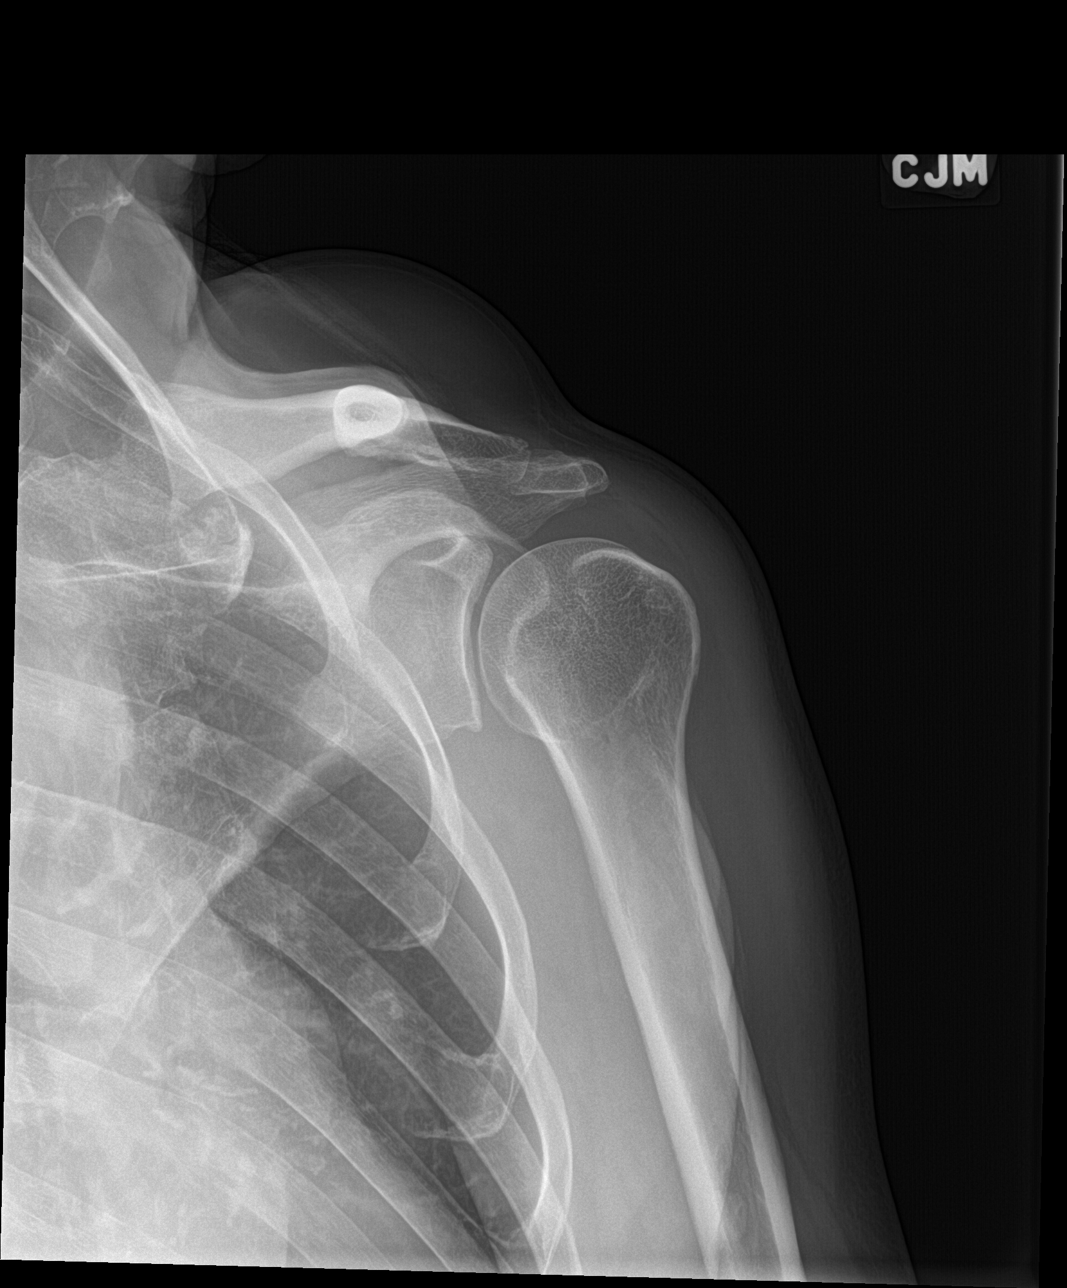

[4 of 4 positions shown; findings below may reference images not displayed]

FINDINGS: Nonspecific masslike soft tissue thickening in the superior left
back. No fracture, dislocation, Hill-Sachs deformity, bone erosions
or suspicious focal osseous lesion. Minimal osteoarthritis in the
left acromioclavicular an inferior left glenohumeral joints. No
pathologic soft tissue calcifications.
IMPRESSION: 1. Nonspecific masslike soft tissue thickening in the superior left
back. Recommend correlation with clinical exam. Further evaluation,
including any decision to pursue additional imaging, should be based
on clinical assessment .
2. No fracture or malalignment in the left shoulder. No bone
lesions.
3. Minimal osteoarthritis as described in the left shoulder.

## 2019-05-30 ENCOUNTER — Ambulatory Visit: Payer: 59

## 2019-06-03 ENCOUNTER — Ambulatory Visit: Payer: Medicare Other | Attending: Family

## 2019-06-03 DIAGNOSIS — Z23 Encounter for immunization: Secondary | ICD-10-CM | POA: Insufficient documentation

## 2019-06-03 NOTE — Progress Notes (Signed)
   Covid-19 Vaccination Clinic  Name:  ASAH RAMSIER    MRN: [id number] DOB: 11/15/1950  06/03/2019  Mr. Weyand was observed post Covid-19 immunization for 15 minutes without incidence. He was provided with Vaccine Information Sheet and instruction to access the V-Safe system.   Mr. Constantinides was instructed to call 911 with any severe reactions post vaccine: Marland Kitchen Difficulty breathing  . Swelling of your face and throat  . A fast heartbeat  . A bad rash all over your body  . Dizziness and weakness    Immunizations Administered    Name Date Dose VIS Date Route   Moderna COVID-19 Vaccine 06/03/2019  3:03 PM 0.5 mL 03/12/2019 Intramuscular   Manufacturer: Moderna   Lot: [id number]   Glen [id number]

## 2019-07-09 ENCOUNTER — Ambulatory Visit: Payer: Medicare Other | Attending: Internal Medicine

## 2020-03-10 ENCOUNTER — Encounter (HOSPITAL_COMMUNITY): Payer: Self-pay | Admitting: Emergency Medicine

## 2020-03-10 ENCOUNTER — Ambulatory Visit (HOSPITAL_COMMUNITY)
Admission: EM | Admit: 2020-03-10 | Discharge: 2020-03-10 | Disposition: A | Discharge status: Home / Self Care | Payer: Medicare Other | Attending: Family Medicine | Admitting: Family Medicine

## 2020-03-10 ENCOUNTER — Other Ambulatory Visit: Payer: Self-pay

## 2020-03-10 DIAGNOSIS — I10 Essential (primary) hypertension: Secondary | ICD-10-CM

## 2020-03-10 DIAGNOSIS — T148XXA Other injury of unspecified body region, initial encounter: Secondary | ICD-10-CM

## 2020-03-10 DIAGNOSIS — Z760 Encounter for issue of repeat prescription: Secondary | ICD-10-CM | POA: Diagnosis not present

## 2020-03-10 DIAGNOSIS — R0781 Pleurodynia: Secondary | ICD-10-CM | POA: Diagnosis not present

## 2020-03-10 MED ORDER — AMLODIPINE BESYLATE 10 MG PO TABS: 10.0000 mg | ORAL_TABLET | Freq: Every day | ORAL | 2 refills | Status: DC

## 2020-03-10 NOTE — ED Triage Notes (Addendum)
Pt presents with right sided rib pain xs 2 weeks. Denies any injury or falls.   Pt states has been without BP medication for 2-3 weeks.

## 2020-03-10 NOTE — Discharge Instructions (Signed)
I have refilled your blood pressure medication.  Take this daily to help control your blood pressure.  I think that you may have pulled a muscle on your side.  I have you take ibuprofen or Tylenol for this pain.  You may lay on a heating pad if this is not enough.  Follow up with this office or with primary care if symptoms are persisting.  Follow up in the ER for high fever, trouble swallowing, trouble breathing, other concerning symptoms.

## 2020-03-11 NOTE — ED Provider Notes (Signed)
[place]   [id number] 03/10/20 Arrival Time: 1452  BM:WUXLK PAIN  SUBJECTIVE: History from: patient. Dennis Levine is a 69 y.o. male complains of right side pain that began about 2 weeks ago. Denies a precipitating event or specific injury. Cannot localize pain as he states that he is not hurting at the moment. Describes the pain as intermittent and achy in character. Has not attempted OTC treatment. Symptoms are made worse with activity. Reports that he also needs a refill of his blood pressure medications. Denies similar symptoms in the past. Denies fever, chills, erythema, ecchymosis, effusion, weakness, numbness and tingling, saddle paresthesias, loss of bowel or bladder function.      ROS: As per HPI.  All other pertinent ROS negative.     Past Medical History:  Diagnosis Date  . Cancer [facility])    prostate  . Hepatitis    hepatitis c   . Hypertension    Past Surgical History:  Procedure Laterality Date  . 4th index finger straightened  1990's  . finger surgery for injury  1970's right hand  . PELVIC LYMPH NODE DISSECTION N/A 02/03/2016   Procedure: PELVIC LYMPH NODE DISSECTION;  Surgeon: Ardis Hughs, MD;  Location: [facility];  Service: Urology;  Laterality: N/A;  . ROBOT ASSISTED LAPAROSCOPIC RADICAL PROSTATECTOMY N/A 02/03/2016   Procedure: XI ROBOTIC ASSISTED LAPAROSCOPIC RADICAL PROSTATECTOMY;  Surgeon: Ardis Hughs, MD;  Location: [facility];  Service: Urology;  Laterality: N/A;   No Known Allergies No current facility-administered medications on file prior to encounter.   Current Outpatient Medications on File Prior to Encounter  Medication Sig Dispense Refill  . docusate sodium (COLACE) 100 MG capsule Take 1 capsule (100 mg total) by mouth 2 (two) times daily. 60 capsule 0  . oxyCODONE (OXY IR/ROXICODONE) 5 MG immediate release tablet Take 1-2 tablets (5-10 mg total) by mouth every 4 (four) hours as needed for moderate pain. 15 tablet 0  .  sulfamethoxazole-trimethoprim (BACTRIM DS,SEPTRA DS) 800-160 MG tablet Take 1 tablet by mouth 2 (two) times daily. Start taking one day prior to your appointment for your first follow-up and catheter removal.  Continue taking for three days. 6 tablet 0   Social History   Socioeconomic History  . Marital status: Single    Spouse name: Not on file  . Number of children: Not on file  . Years of education: Not on file  . Highest education level: Not on file  Occupational History  . Not on file  Tobacco Use  . Smoking status: Never Smoker  . Smokeless tobacco: Never Used  Substance and Sexual Activity  . Alcohol use: Yes    Comment: 6 pack beer most days  . Drug use: Yes    Types: Marijuana    Comment: marijuana occ last used 01-31-16  . Sexual activity: Not on file  Other Topics Concern  . Not on file  Social History Narrative  . Not on file   Social Determinants of Health   Financial Resource Strain:   . Difficulty of Paying Living Expenses: Not on file  Food Insecurity:   . Worried About Charity fundraiser in the Last Year: Not on file  . Ran Out of Food in the Last Year: Not on file  Transportation Needs:   . Lack of Transportation (Medical): Not on file  . Lack of Transportation (Non-Medical): Not on file  Physical Activity:   . Days of Exercise per Week: Not on file  .  Minutes of Exercise per Session: Not on file  Stress:   . Feeling of Stress : Not on file  Social Connections:   . Frequency of Communication with Friends and Family: Not on file  . Frequency of Social Gatherings with Friends and Family: Not on file  . Attends Religious Services: Not on file  . Active Member of Clubs or Organizations: Not on file  . Attends Archivist Meetings: Not on file  . Marital Status: Not on file  Intimate Partner Violence:   . Fear of Current or Ex-Partner: Not on file  . Emotionally Abused: Not on file  . Physically Abused: Not on file  . Sexually Abused: Not on  file   History reviewed. No pertinent family history.  OBJECTIVE:  Vitals:   03/10/20 1607  BP: (!) 186/112  Pulse: 88  Resp: 18  Temp: 98.9 F (37.2 C)  TempSrc: Oral  SpO2: 100%    General appearance: ALERT; in no acute distress.  Head: NCAT Lungs: Normal respiratory effort CV: pulses 2+ bilaterally. Cap refill < 2 seconds Musculoskeletal:  Inspection: Skin warm, dry, clear and intact without obvious erythema, effusion, or ecchymosis.  Palpation: Nontender to palpation ROM: FROM active and passive Skin: warm and dry Neurologic: Ambulates without difficulty; Sensation intact about the upper/ lower extremities Psychological: alert and cooperative; normal mood and affect  DIAGNOSTIC STUDIES:  No results found.   ASSESSMENT & PLAN:  1. Muscle strain   2. Essential hypertension   3. Medication refill   4. Rib pain on right side     Meds ordered this encounter  Medications  . amLODipine (NORVASC) 10 MG tablet    Sig: Take 1 tablet (10 mg total) by mouth daily.    Dispense:  90 tablet    Refill:  2    Order Specific Question:   Supervising Provider    Answer:   Chase Picket [[id number]]   Refilled amlodipine Work note provided Counseled on the importance of continuing to take his medications to keep his blood pressure controlled Continue conservative management of rest, ice, and gentle stretches Take ibuprofen as needed for pain relief (may cause abdominal discomfort, ulcers, and GI bleeds avoid taking with other NSAIDs) Follow up with PCP if symptoms persist Return or go to the ER if you have any new or worsening symptoms (fever, chills, chest pain, abdominal pain, changes in bowel or bladder habits, pain radiating into lower legs)   Reviewed expectations re: course of current medical issues. Questions answered. Outlined signs and symptoms indicating need for more acute intervention. Patient verbalized understanding. After Visit Summary given.         Faustino Congress, NP 03/11/20 [phone]

## 2020-07-09 ENCOUNTER — Ambulatory Visit (INDEPENDENT_AMBULATORY_CARE_PROVIDER_SITE_OTHER): Payer: Medicare Other

## 2020-07-09 ENCOUNTER — Ambulatory Visit (HOSPITAL_COMMUNITY)
Admission: EM | Admit: 2020-07-09 | Discharge: 2020-07-09 | Disposition: A | Discharge status: Home / Self Care | Payer: Medicare Other | Attending: Student | Admitting: Student

## 2020-07-09 ENCOUNTER — Encounter (HOSPITAL_COMMUNITY): Payer: Self-pay

## 2020-07-09 ENCOUNTER — Other Ambulatory Visit: Payer: Self-pay

## 2020-07-09 DIAGNOSIS — R109 Unspecified abdominal pain: Secondary | ICD-10-CM | POA: Diagnosis not present

## 2020-07-09 DIAGNOSIS — M7989 Other specified soft tissue disorders: Secondary | ICD-10-CM

## 2020-07-09 DIAGNOSIS — M25512 Pain in left shoulder: Secondary | ICD-10-CM | POA: Diagnosis not present

## 2020-07-09 DIAGNOSIS — G8929 Other chronic pain: Secondary | ICD-10-CM

## 2020-07-09 DIAGNOSIS — M47812 Spondylosis without myelopathy or radiculopathy, cervical region: Secondary | ICD-10-CM | POA: Diagnosis not present

## 2020-07-09 DIAGNOSIS — R2242 Localized swelling, mass and lump, left lower limb: Secondary | ICD-10-CM | POA: Diagnosis not present

## 2020-07-09 DIAGNOSIS — I10 Essential (primary) hypertension: Secondary | ICD-10-CM

## 2020-07-09 DIAGNOSIS — R2232 Localized swelling, mass and lump, left upper limb: Secondary | ICD-10-CM

## 2020-07-09 NOTE — ED Provider Notes (Signed)
[place]    CSN: [id number] Arrival date & time: 07/09/20  0946      History   Chief Complaint Chief Complaint  Patient presents with  . Abdominal Pain  . Shoulder Pain    HPI Dennis Levine is a 70 y.o. male presenting with chronic left shoulder pain and "side pain" for a few minutes earlier this week.  History of prostate cancer, hep C, hypertension.  -Notes left shoulder pain since he was 70 years old (37 years).  States this has been exacerbated for the last 2 weeks- though he is experiencing no pain today. States this has never been worked up- but we have an Insurance account manager on file from 2017 of this.  Denies any trauma or changes in activity.  States he has always had a lump in his shoulder that is nontender.  No changes in this is getting bigger.  Adamantly denies chest pain, back pain, dizziness, shortness of breath, weakness or sensation changes in arms or legs, headaches, vision changes. He is right handed. -Also with few minutes of left-side pain 2 weeks ago. Cant remember what he was doing when this happened, but it stopped on its own after few minutes and he hasn't experienced it again since. States no other GI issues, urinary symptoms, nausea/vomiting/diarrhea.  Last bowel movement was today and was normal.  Wt Readings from Last 3 Encounters:  07/09/20 135 lb (61.2 kg)  02/03/16 134 lb (60.8 kg)  02/01/16 134 lb (60.8 kg)    HPI  Past Medical History:  Diagnosis Date  . Cancer [facility])    prostate  . Hepatitis    hepatitis c   . Hypertension     Patient Active Problem List   Diagnosis Date Noted  . Prostate cancer ([place]) 02/03/2016    Past Surgical History:  Procedure Laterality Date  . 4th index finger straightened  1990's  . finger surgery for injury  1970's right hand  . PELVIC LYMPH NODE DISSECTION N/A 02/03/2016   Procedure: PELVIC LYMPH NODE DISSECTION;  Surgeon: Ardis Hughs, MD;  Location: [facility];  Service: Urology;  Laterality: N/A;  .  ROBOT ASSISTED LAPAROSCOPIC RADICAL PROSTATECTOMY N/A 02/03/2016   Procedure: XI ROBOTIC ASSISTED LAPAROSCOPIC RADICAL PROSTATECTOMY;  Surgeon: Ardis Hughs, MD;  Location: [facility];  Service: Urology;  Laterality: N/A;       Home Medications    Prior to Admission medications   Medication Sig Start Date End Date Taking? Authorizing Provider  amLODipine (NORVASC) 10 MG tablet Take 1 tablet (10 mg total) by mouth daily. 03/10/20 06/08/20  Faustino Congress, NP  docusate sodium (COLACE) 100 MG capsule Take 1 capsule (100 mg total) by mouth 2 (two) times daily. 02/04/16   Ardis Hughs, MD  oxyCODONE (OXY IR/ROXICODONE) 5 MG immediate release tablet Take 1-2 tablets (5-10 mg total) by mouth every 4 (four) hours as needed for moderate pain. 02/04/16   Ardis Hughs, MD  sulfamethoxazole-trimethoprim (BACTRIM DS,SEPTRA DS) 800-160 MG tablet Take 1 tablet by mouth 2 (two) times daily. Start taking one day prior to your appointment for your first follow-up and catheter removal.  Continue taking for three days. 02/04/16   Ardis Hughs, MD    Family History History reviewed. No pertinent family history.  Social History Social History   Tobacco Use  . Smoking status: Never Smoker  . Smokeless tobacco: Never Used  Substance Use Topics  . Alcohol use: Yes    Comment: 6 pack beer  most days  . Drug use: Yes    Types: Marijuana    Comment: marijuana occ last used 01-31-16     Allergies   Patient has no known allergies.   Review of Systems Review of Systems  Constitutional: Negative for appetite change, chills, diaphoresis, fever and unexpected weight change.  HENT: Negative for congestion, ear pain, sinus pressure, sinus pain, sneezing, sore throat and trouble swallowing.   Respiratory: Negative for cough, chest tightness and shortness of breath.   Cardiovascular: Negative for chest pain.  Gastrointestinal: Negative for abdominal distention, abdominal pain, anal  bleeding, blood in stool, constipation, diarrhea, nausea, rectal pain and vomiting.  Genitourinary: Negative for dysuria, flank pain, frequency and urgency.  Musculoskeletal: Negative for back pain and myalgias.       Chronic left shoulder pain   Neurological: Negative for dizziness, light-headedness and headaches.  All other systems reviewed and are negative.    Physical Exam Triage Vital Signs ED Triage Vitals  Enc Vitals Group     BP      Pulse      Resp      Temp      Temp src      SpO2      Weight      Height      Head Circumference      Peak Flow      Pain Score      Pain Loc      Pain Edu?      Excl. in [place]?    No data found.  Updated Vital Signs BP 138/86 (BP Location: Left Arm)   Pulse 71   Temp 97.7 F (36.5 C) (Oral)   Resp 16   Wt 135 lb (61.2 kg)   SpO2 98%   BMI 19.37 kg/m   Visual Acuity Right Eye Distance:   Left Eye Distance:   Bilateral Distance:    Right Eye Near:   Left Eye Near:    Bilateral Near:     Physical Exam Vitals reviewed.  Constitutional:      General: He is not in acute distress.    Appearance: Normal appearance. He is well-developed. He is not ill-appearing.  HENT:     Head: Normocephalic and atraumatic.     Mouth/Throat:     Mouth: Mucous membranes are moist.     Comments: Moist mucous membranes Eyes:     Extraocular Movements: Extraocular movements intact.     Pupils: Pupils are equal, round, and reactive to light.  Cardiovascular:     Rate and Rhythm: Normal rate and regular rhythm.     Pulses:          Radial pulses are 2+ on the right side and 2+ on the left side.     Heart sounds: Normal heart sounds.  Pulmonary:     Effort: Pulmonary effort is normal.     Breath sounds: Normal breath sounds. No wheezing, rhonchi or rales.  Abdominal:     General: Bowel sounds are normal. There is no distension.     Palpations: Abdomen is soft. There is no mass.     Tenderness: There is no abdominal tenderness. There is no  right CVA tenderness, left CVA tenderness, guarding or rebound. Negative signs include Murphy's sign, Rovsing's sign and McBurney's sign.     Hernia: No hernia is present.  Musculoskeletal:     Right lower leg: No edema.     Left lower leg: No edema.  Comments: L anterior proximal humerus with 3x3cm firm round area, nontender, nonmobile. Absolutely no tenderness to palpation of shoulder joint or mass. Shoulder ROM intact and without pain. Sensation intact. Neurovascularly intact.   Skin:    General: Skin is warm.     Capillary Refill: Capillary refill takes less than 2 seconds.     Comments: Good skin turgor  Neurological:     General: No focal deficit present.     Mental Status: He is alert and oriented to person, place, and time.  Psychiatric:        Mood and Affect: Mood normal.        Behavior: Behavior normal.      UC Treatments / Results  Labs (all labs ordered are listed, but only abnormal results are displayed) Labs Reviewed - No data to display  EKG   Radiology DG Shoulder Left  Result Date: 07/09/2020 CLINICAL DATA:  Bony lump anterior proximal humerus. Nontender. Left shoulder and abdominal pain for 2 weeks ago. EXAM: LEFT SHOULDER - 2+ VIEW COMPARISON:  08/11/2015 FINDINGS: No significant osseous abnormality. Degenerative changes of the cervical spine are partially visualized. Soft tissue mass again seen overlying the lateral clavicle read ring 9.6 x 9.1 x 3.7 cm compared to 11.0 x 7.9 x 3.0 cm on the prior exam. IMPRESSION: 1. No acute abnormality of the left shoulder. 2. Soft tissue mass overlying the left lateral clavicle is not significantly changed in overall size since 2017 which favors a benign etiology. Electronically Signed   By: Miachel Roux M.D.   On: 07/09/2020 11:05    Procedures Procedures (including critical care time)  Medications Ordered in UC Medications - No data to display  Initial Impression / Assessment and Plan / UC Course  I have  reviewed the triage vital signs and the nursing notes.  Pertinent labs & imaging results that were available during my care of the patient were reviewed by me and considered in my medical decision making (see chart for details).     This patient is a 70 year old male presenting with exacerbation of chronic left shoulder pain. Today this pt is afebrile nontachycardic nontachypneic, oxygenating well on room air, no wheezes rhonchi or rales. Neuro exam benign. Weight stable for last 5+ years. Absolutely no abdominal pain today, no changes in bowel or bladder function.  EKG today NSR.  Unchanged from 2017 EKG.  Xray L shoulder- 1. No acute abnormality of the left shoulder. 2. Soft tissue mass overlying the left lateral clavicle is not significantly changed in overall size since 2017 which favors a benign etiology. Films interpreted by myself and radiologist.  Reassurance provided.  For hypertension, continue current regimen.  ED return precautions discussed.  This chart was dictated using voice recognition software, Dragon. Despite the best efforts of this provider to proofread and correct errors, errors may still occur which can change documentation meaning.   Final Clinical Impressions(s) / UC Diagnoses   Final diagnoses:  Essential hypertension  Chronic left shoulder pain  Mass of soft tissue of shoulder     Discharge Instructions     -Please establish care with a primary care provider to address your long term medical conditions. Information below. -For hypertension, continue your current regimen-amlodipine (Norvasc). -If you develop worsening of left shoulder pain, chest pain, shortness of breath, vision changes, the worst headache of your life- head straight to the ED or call 911. -The area in your shoulder has not changed since 2017, which is a very good  sign.  If this seems to be getting bigger, more painful, etc.-seek additional immediate medical attention.  This would be a  great thing to follow-up with your primary care provider about.     ED Prescriptions    None     PDMP not reviewed this encounter.   Hazel Sams, PA-C 07/09/20 1121

## 2020-07-09 NOTE — Discharge Instructions (Addendum)
-  Please establish care with a primary care provider to address your long term medical conditions. Information below. -For hypertension, continue your current regimen-amlodipine (Norvasc). -If you develop worsening of left shoulder pain, chest pain, shortness of breath, vision changes, the worst headache of your life- head straight to the ED or call 911. -The area in your shoulder has not changed since 2017, which is a very good sign.  If this seems to be getting bigger, more painful, etc.-seek additional immediate medical attention.  This would be a great thing to follow-up with your primary care provider about.

## 2020-07-09 NOTE — ED Triage Notes (Signed)
Patient presents to Urgent Care with complaints of left shoulder and left sided abdominal pain x 2 weeks ago. Pt states he has of hx of chronic left shoulder pain since the age of 70 years old. He reports over the past two weeks pain in shoulder has increased, denies any changes in activity. He also reports Intermittent left lower abdominal pain, no hx of GI issues or urinary symptoms. Last BM today.   Denies fever, n/v, or diarrhea.

## 2020-10-07 ENCOUNTER — Other Ambulatory Visit: Payer: Self-pay

## 2020-10-07 ENCOUNTER — Emergency Department (HOSPITAL_COMMUNITY)
Admission: EM | Admit: 2020-10-07 | Discharge: 2020-10-07 | Disposition: A | Discharge status: Home / Self Care | Payer: Medicare Other | Attending: Emergency Medicine | Admitting: Emergency Medicine

## 2020-10-07 ENCOUNTER — Encounter (HOSPITAL_COMMUNITY): Payer: Self-pay

## 2020-10-07 ENCOUNTER — Emergency Department (HOSPITAL_COMMUNITY): Payer: Medicare Other

## 2020-10-07 DIAGNOSIS — R4182 Altered mental status, unspecified: Secondary | ICD-10-CM | POA: Insufficient documentation

## 2020-10-07 DIAGNOSIS — M25512 Pain in left shoulder: Secondary | ICD-10-CM | POA: Diagnosis not present

## 2020-10-07 DIAGNOSIS — Z79899 Other long term (current) drug therapy: Secondary | ICD-10-CM | POA: Diagnosis not present

## 2020-10-07 DIAGNOSIS — R109 Unspecified abdominal pain: Secondary | ICD-10-CM | POA: Insufficient documentation

## 2020-10-07 DIAGNOSIS — Z8546 Personal history of malignant neoplasm of prostate: Secondary | ICD-10-CM | POA: Diagnosis not present

## 2020-10-07 DIAGNOSIS — I10 Essential (primary) hypertension: Secondary | ICD-10-CM | POA: Insufficient documentation

## 2020-10-07 LAB — COMPREHENSIVE METABOLIC PANEL
ALT: 45 U/L — ABNORMAL HIGH (ref 0–44)
AST: 66 U/L — ABNORMAL HIGH (ref 15–41)
Albumin: 3.9 g/dL (ref 3.5–5.0)
Alkaline Phosphatase: 58 U/L (ref 38–126)
Anion gap: 11 (ref 5–15)
BUN: 7 mg/dL — ABNORMAL LOW (ref 8–23)
CO2: 24 mmol/L (ref 22–32)
Calcium: 9.5 mg/dL (ref 8.9–10.3)
Chloride: 104 mmol/L (ref 98–111)
Creatinine, Ser: 0.86 mg/dL (ref 0.61–1.24)
GFR, Estimated: >60 mL/min (ref >60)
Glucose, Bld: 104 mg/dL — ABNORMAL HIGH (ref 70–99)
Potassium: 3.7 mmol/L (ref 3.5–5.1)
Sodium: 139 mmol/L (ref 135–145)
Total Bilirubin: 1.6 mg/dL — ABNORMAL HIGH (ref 0.3–1.2)
Total Protein: 7.7 g/dL (ref 6.5–8.1)

## 2020-10-07 LAB — RAPID URINE DRUG SCREEN, HOSP PERFORMED
Amphetamines: NOT DETECTED
Barbiturates: NOT DETECTED
Benzodiazepines: NOT DETECTED
Cocaine: NOT DETECTED
Opiates: NOT DETECTED
Tetrahydrocannabinol: POSITIVE — AB

## 2020-10-07 LAB — URINALYSIS, ROUTINE W REFLEX MICROSCOPIC
Bilirubin Urine: NEGATIVE
Glucose, UA: NEGATIVE mg/dL
Hgb urine dipstick: NEGATIVE
Ketones, ur: 20 mg/dL — AB
Leukocytes,Ua: NEGATIVE
Nitrite: NEGATIVE
Protein, ur: NEGATIVE mg/dL
Specific Gravity, Urine: 1.012 (ref 1.005–1.030)
pH: 7 (ref 5.0–8.0)

## 2020-10-07 LAB — LIPASE, BLOOD: Lipase: 39 U/L (ref 11–51)

## 2020-10-07 LAB — CBC
HCT: 48.5 % (ref 39.0–52.0)
Hemoglobin: 15.4 g/dL (ref 13.0–17.0)
MCH: 27.5 pg (ref 26.0–34.0)
MCHC: 31.8 g/dL (ref 30.0–36.0)
MCV: 86.5 fL (ref 80.0–100.0)
Platelets: 240 10*3/uL (ref 150–400)
RBC: 5.61 MIL/uL (ref 4.22–5.81)
RDW: 14.8 % (ref 11.5–15.5)
WBC: 4.2 10*3/uL (ref 4.0–10.5)
nRBC: 0 % (ref 0.0–0.2)

## 2020-10-07 LAB — TROPONIN I (HIGH SENSITIVITY)
Troponin I (High Sensitivity): 5 ng/L (ref <18)
Troponin I (High Sensitivity): 6 ng/L (ref <18)

## 2020-10-07 LAB — PROTIME-INR
INR: 1 (ref 0.8–1.2)
Prothrombin Time: 12.8 seconds (ref 11.4–15.2)

## 2020-10-07 LAB — ETHANOL: Alcohol, Ethyl (B): <10 mg/dL

## 2020-10-07 NOTE — ED Provider Notes (Signed)
[place] EMERGENCY DEPARTMENT Provider Note   CSN: [id number] Arrival date & time: 10/07/20  7096     History Chief Complaint  Patient presents with   Shoulder Pain   Abdominal Pain    Dennis Levine is a 70 y.o. male.  HPI I have gotten conflicting histories.  Triage note in first look exam indicates patient presented with complaints of left shoulder pain and left-sided chest or abdominal pain.  Shoulder condition identified as being longstanding with a protrusive mass on the top of the shoulder since childhood per the patient.  When I interviewed the patient, he told me he was here because someone at work thought he needed to be checked out.  He indicates his supervisor told him he needed to come to the emergency department.  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  The patient denies that he is noticing any symptoms like that.  He reports he feels fine and does not endorse having any active pain in his shoulder or chest at this time, he denies perceiving any problems with his speech.  He denies any focal weakness or numbness of extremities.  Patient reports he does drink alcohol most days if he can get it.  Typically, he will drink 2-3 large cans of beer.  He reports last alcohol was yesterday evening.  He denies getting symptoms of withdrawal such as tremors or confusion if he does not get to drink.  He also endorses fairly regular marijuana use although denies daily use.  He reports last marijuana was yesterday evening.  He denies other drugs of abuse.  Denies any cocaine.    Past Medical History:  Diagnosis Date   Cancer [facility])    prostate   Hepatitis    hepatitis c    Hypertension     Patient Active Problem List   Diagnosis Date Noted   Prostate cancer ([place]) 02/03/2016    Past Surgical  History:  Procedure Laterality Date   4th index finger straightened  1990's   finger surgery for injury  1970's right hand   PELVIC LYMPH NODE DISSECTION N/A 02/03/2016   Procedure: PELVIC LYMPH NODE DISSECTION;  Surgeon: Ardis Hughs, MD;  Location: [facility];  Service: Urology;  Laterality: N/A;   ROBOT ASSISTED LAPAROSCOPIC RADICAL PROSTATECTOMY N/A 02/03/2016   Procedure: XI ROBOTIC ASSISTED LAPAROSCOPIC RADICAL PROSTATECTOMY;  Surgeon: Ardis Hughs, MD;  Location: [facility];  Service: Urology;  Laterality: N/A;       No family history on file.  Social History   Tobacco Use   Smoking status: Never   Smokeless tobacco: Never  Substance Use Topics   Alcohol use: Yes    Comment: 6 pack beer most days   Drug use: Yes    Types: Marijuana    Comment: marijuana occ last used 01-31-16    Home Medications Prior to Admission medications   Medication Sig Start Date End Date Taking? Authorizing Provider  amLODipine (NORVASC) 10 MG tablet Take 1 tablet (10 mg total) by mouth daily. 03/10/20 06/08/20  Faustino Congress, NP  docusate sodium (COLACE) 100 MG capsule Take 1 capsule (100 mg total) by mouth 2 (two) times daily. 02/04/16   Ardis Hughs, MD  oxyCODONE (OXY IR/ROXICODONE) 5 MG immediate release tablet Take 1-2 tablets (5-10 mg total) by mouth every 4 (four) hours as  needed for moderate pain. 02/04/16   Ardis Hughs, MD  sulfamethoxazole-trimethoprim (BACTRIM DS,SEPTRA DS) 800-160 MG tablet Take 1 tablet by mouth 2 (two) times daily. Start taking one day prior to your appointment for your first follow-up and catheter removal.  Continue taking for three days. 02/04/16   Ardis Hughs, MD    Allergies    Patient has no known allergies.  Review of Systems   Review of Systems 10 systems reviewed and negative except as per HPI Physical Exam Updated Vital Signs BP (!) 154/93   Pulse 74   Temp 98.6 F (37 C) (Oral)   Resp 18   SpO2 100%   Physical  Exam Constitutional:      Comments: Patient is alert.  Nontoxic.  No respiratory distress.  He is very thin.  HENT:     Head: Normocephalic and atraumatic.     Mouth/Throat:     Mouth: Mucous membranes are moist.     Pharynx: Oropharynx is clear.     Comments: Poor dentition.  No facial swelling Eyes:     Extraocular Movements: Extraocular movements intact.     Pupils: Pupils are equal, round, and reactive to light.  Cardiovascular:     Rate and Rhythm: Normal rate and regular rhythm.  Pulmonary:     Effort: Pulmonary effort is normal.     Breath sounds: Normal breath sounds.  Abdominal:     General: There is no distension.     Palpations: Abdomen is soft.     Tenderness: There is no abdominal tenderness. There is no guarding.  Musculoskeletal:     Cervical back: Neck supple.     Comments: Patient has about a half an orange sized soft tissue mass on the top of the left shoulder.  All overlying skin structures are normal.  It is not erythematous or tender.  Normal range of motion of the shoulder.  No edema of the upper extremities.  No edema of the lower extremities.  Normal range of motion.  Skin:    General: Skin is warm and dry.  Neurological:     Comments: Patient can appropriately repeat words.  No expressive or receptive aphasia.  Finger nose exam intact.  Motor strength 5\5 upper and lower extremities.  No pronator drift.  Patient is very mildly tremulous.  However, this does extinguish when he rests his arms on his bed.  Psychiatric:        Mood and Affect: Mood normal.    ED Results / Procedures / Treatments   Labs (all labs ordered are listed, but only abnormal results are displayed) Labs Reviewed  COMPREHENSIVE METABOLIC PANEL - Abnormal; Notable for the following components:      Result Value   Glucose, Bld 104 (*)    BUN 7 (*)    AST 66 (*)    ALT 45 (*)    Total Bilirubin 1.6 (*)    All other components within normal limits  CBC  ETHANOL  PROTIME-INR   LIPASE, BLOOD  URINALYSIS, ROUTINE W REFLEX MICROSCOPIC  RAPID URINE DRUG SCREEN, HOSP PERFORMED  TROPONIN I (HIGH SENSITIVITY)  TROPONIN I (HIGH SENSITIVITY)    EKG EKG Interpretation  Date/Time:  Wednesday October 07 2020 09:43:24 EDT Ventricular Rate:  92 PR Interval:  146 QRS Duration: 84 QT Interval:  402 QTC Calculation: 497 R Axis:   66 Text Interpretation: Normal sinus rhythm Biatrial enlargement Prolonged QT Abnormal ECG rate increased otherwise no sig change from previous Confirmed  by Charlesetta Shanks [phone]) on 10/07/2020 12:53:58 PM  Radiology DG Chest 2 View  Result Date: 10/07/2020 CLINICAL DATA:  Chest, shoulder and abdominal pain today. EXAM: CHEST - 2 VIEW COMPARISON:  None. FINDINGS: Lungs are clear. Heart size is normal. No pneumothorax or pleural fluid. No bony abnormality. IMPRESSION: Negative chest. Electronically Signed   By: Inge Rise M.D.   On: 10/07/2020 13:17   CT Head Wo Contrast  Result Date: 10/07/2020 CLINICAL DATA:  Altered mental status EXAM: CT HEAD WITHOUT CONTRAST TECHNIQUE: Contiguous axial images were obtained from the base of the skull through the vertex without intravenous contrast. COMPARISON:  None. FINDINGS: Brain: There is mild diffuse atrophy. There is no intracranial mass, hemorrhage, extra-axial fluid collection, midline shift. There is evidence of a prior small infarct in the head of the caudate nucleus on the left. There is slight decreased attenuation in portions of the centra semiovale bilaterally. No acute appearing infarct is appreciable on this study. Vascular: No hyperdense vessel. There is calcification in the distal left vertebral artery as well as in the carotid siphon regions bilaterally. Skull: Bony calvarium appears intact. Sinuses/Orbits: There is mild mucosal thickening in several ethmoid air cells. Other visualized paranasal sinuses are clear. Visualized orbits appear symmetric bilaterally. Other: Mastoid air cells are  clear. There is debris in each external auditory canal. IMPRESSION: Atrophy with mild periventricular small vessel disease. Prior small lacunar infarct in the head of the caudate nucleus on the left. No acute appearing infarct evident. No mass or hemorrhage. Foci of arterial vascular calcification noted. There is mild mucosal thickening in several ethmoid air cells. Electronically Signed   By: Lowella Grip III M.D.   On: 10/07/2020 15:07    Procedures Procedures   Medications Ordered in ED Medications - No data to display  ED Course  I have reviewed the triage vital signs and the nursing notes.  Pertinent labs & imaging results that were available during my care of the patient were reviewed by me and considered in my medical decision making (see chart for details).    MDM Rules/Calculators/A&P                          Patient does not have any specific complaints.  Diagnostic work-up is unremarkable.  Patient reports he does drink several beers each day.  However, he denies history of withdrawal symptoms.  Patient's last alcohol was yesterday.  He is not exhibiting any active withdrawal.  Upon recheck he is calmly watching television and shows no signs of distress.  He does not have tachycardia or accelerated hypertension.  Patient has a chronic mass on the right shoulder.  This appears very stable.  He has no complaints regarding this.  Patient has not endorsing any thoracic chest pain at this time.  He has no hypoxia no tachycardia and troponins are negative.  At this time patient is counseled on need for ongoing primary care.  Resource guide is provided.  Return precautions reviewed.  Patient is discharged with clear mental status and stable vital signs Final Clinical Impression(s) / ED Diagnoses Final diagnoses:  Altered mental status, unspecified altered mental status type    Rx / DC Orders ED Discharge Orders     None        Charlesetta Shanks, MD 10/07/20 1555

## 2020-10-07 NOTE — ED Notes (Signed)
Pt able to walk to restroom, steady gait and no assist.

## 2020-10-07 NOTE — ED Notes (Signed)
Patient transported to CT 

## 2020-10-07 NOTE — ED Triage Notes (Signed)
Pt reports right mid abd pain and Left shoulder pain x1 week.

## 2020-10-07 NOTE — ED Provider Notes (Signed)
Emergency Medicine Provider Triage Evaluation Note  Dennis Levine , a 70 y.o. male  was evaluated in triage.  Pt complains of 1 week history of atraumatic left shoulder pain and right lower quadrant abdominal pain.  They started around the same time.  He states that symptoms simply have not been getting any better which prompted him to come to the ED for evaluation.  He was also advised to come here by his place of employment.  Denies any obvious precipitating trauma or injury.  He also denies any fevers, chills, chest pain, shortness of breath symptoms.  Review of Systems  Positive: RLQ and left shoulder pain.  Negative: Fevers, chills, CP, SOB  Physical Exam  There were no vitals taken for this visit. Gen:   Awake, no distress   Resp:  Normal effort  Abd:  Soft, nondistended.  Medical Decision Making  Medically screening exam initiated at 9:40 AM.  Appropriate orders placed.  [name] was informed that the remainder of the evaluation will be completed by another provider, this initial triage assessment does not replace that evaluation, and the importance of remaining in the ED until their evaluation is complete.    Corena Herter, PA-C 10/07/20 0945    Sherwood Gambler, MD 10/07/20 1011

## 2020-10-07 NOTE — Discharge Instructions (Addendum)
1.  It is very important that you get established with a family doctor.  You have given a resource guide to help find a provider.  Call for an appointment as soon as possible.  You should be getting primary medical care on a regular basis. 2.  At this time you are not reporting any symptoms that are concerning to you.  You advised that a coworker was concerned.  If you develop any concerning symptoms such as headache, chest pain, fever, nausea, vomiting, shortness of breath, return to the emergency department for recheck.

## 2021-03-23 ENCOUNTER — Inpatient Hospital Stay (HOSPITAL_COMMUNITY): Payer: Medicare Other

## 2021-03-23 ENCOUNTER — Emergency Department (HOSPITAL_COMMUNITY): Payer: Medicare Other

## 2021-03-23 ENCOUNTER — Inpatient Hospital Stay (HOSPITAL_COMMUNITY)
Admission: EM | Admit: 2021-03-23 | Discharge: 2021-04-14 | DRG: 100 | Disposition: A | Discharge status: Skilled Nursing Facility | Payer: Medicare Other | Attending: Internal Medicine | Admitting: Internal Medicine

## 2021-03-23 DIAGNOSIS — M545 Low back pain, unspecified: Secondary | ICD-10-CM | POA: Diagnosis present

## 2021-03-23 DIAGNOSIS — I672 Cerebral atherosclerosis: Secondary | ICD-10-CM | POA: Diagnosis not present

## 2021-03-23 DIAGNOSIS — R0609 Other forms of dyspnea: Secondary | ICD-10-CM | POA: Diagnosis not present

## 2021-03-23 DIAGNOSIS — Z781 Physical restraint status: Secondary | ICD-10-CM | POA: Diagnosis not present

## 2021-03-23 DIAGNOSIS — U071 COVID-19: Secondary | ICD-10-CM | POA: Diagnosis not present

## 2021-03-23 DIAGNOSIS — Z8546 Personal history of malignant neoplasm of prostate: Secondary | ICD-10-CM

## 2021-03-23 DIAGNOSIS — Z79899 Other long term (current) drug therapy: Secondary | ICD-10-CM

## 2021-03-23 DIAGNOSIS — G40901 Epilepsy, unspecified, not intractable, with status epilepticus: Secondary | ICD-10-CM | POA: Diagnosis not present

## 2021-03-23 DIAGNOSIS — F10239 Alcohol dependence with withdrawal, unspecified: Secondary | ICD-10-CM | POA: Diagnosis not present

## 2021-03-23 DIAGNOSIS — J9601 Acute respiratory failure with hypoxia: Secondary | ICD-10-CM | POA: Diagnosis not present

## 2021-03-23 DIAGNOSIS — R569 Unspecified convulsions: Secondary | ICD-10-CM

## 2021-03-23 DIAGNOSIS — R0602 Shortness of breath: Secondary | ICD-10-CM

## 2021-03-23 DIAGNOSIS — Z681 Body mass index (BMI) 19 or less, adult: Secondary | ICD-10-CM

## 2021-03-23 DIAGNOSIS — I5032 Chronic diastolic (congestive) heart failure: Secondary | ICD-10-CM | POA: Diagnosis present

## 2021-03-23 DIAGNOSIS — I6602 Occlusion and stenosis of left middle cerebral artery: Secondary | ICD-10-CM | POA: Diagnosis present

## 2021-03-23 DIAGNOSIS — E43 Unspecified severe protein-calorie malnutrition: Secondary | ICD-10-CM | POA: Diagnosis not present

## 2021-03-23 DIAGNOSIS — D320 Benign neoplasm of cerebral meninges: Secondary | ICD-10-CM | POA: Diagnosis present

## 2021-03-23 DIAGNOSIS — B1920 Unspecified viral hepatitis C without hepatic coma: Secondary | ICD-10-CM | POA: Diagnosis present

## 2021-03-23 DIAGNOSIS — G928 Other toxic encephalopathy: Secondary | ICD-10-CM | POA: Diagnosis present

## 2021-03-23 DIAGNOSIS — I110 Hypertensive heart disease with heart failure: Secondary | ICD-10-CM | POA: Diagnosis not present

## 2021-03-23 DIAGNOSIS — Z0189 Encounter for other specified special examinations: Secondary | ICD-10-CM

## 2021-03-23 DIAGNOSIS — R9431 Abnormal electrocardiogram [ECG] [EKG]: Secondary | ICD-10-CM

## 2021-03-23 DIAGNOSIS — R4182 Altered mental status, unspecified: Secondary | ICD-10-CM

## 2021-03-23 LAB — COMPREHENSIVE METABOLIC PANEL
ALT: 32 U/L (ref 0–44)
AST: 48 U/L — ABNORMAL HIGH (ref 15–41)
Albumin: 3.5 g/dL (ref 3.5–5.0)
Alkaline Phosphatase: 69 U/L (ref 38–126)
Anion gap: 13 (ref 5–15)
BUN: 7 mg/dL — ABNORMAL LOW (ref 8–23)
CO2: 19 mmol/L — ABNORMAL LOW (ref 22–32)
Calcium: 8.8 mg/dL — ABNORMAL LOW (ref 8.9–10.3)
Chloride: 104 mmol/L (ref 98–111)
Creatinine, Ser: 0.9 mg/dL (ref 0.61–1.24)
GFR, Estimated: >60 mL/min (ref >60)
Glucose, Bld: 142 mg/dL — ABNORMAL HIGH (ref 70–99)
Potassium: 4 mmol/L (ref 3.5–5.1)
Sodium: 136 mmol/L (ref 135–145)
Total Bilirubin: 0.6 mg/dL (ref 0.3–1.2)
Total Protein: 7 g/dL (ref 6.5–8.1)

## 2021-03-23 LAB — I-STAT ARTERIAL BLOOD GAS, ED
Acid-Base Excess: 3 mmol/L — ABNORMAL HIGH (ref 0.0–2.0)
Bicarbonate: 27.1 mmol/L (ref 20.0–28.0)
Calcium, Ion: 1.18 mmol/L (ref 1.15–1.40)
HCT: 41 % (ref 39.0–52.0)
Hemoglobin: 13.9 g/dL (ref 13.0–17.0)
O2 Saturation: 100 %
Patient temperature: 96.9
Potassium: 3.9 mmol/L (ref 3.5–5.1)
Sodium: 138 mmol/L (ref 135–145)
TCO2: 28 mmol/L (ref 22–32)
pCO2 arterial: 38.9 mmHg (ref 32.0–48.0)
pH, Arterial: 7.446 (ref 7.350–7.450)
pO2, Arterial: 401 mmHg — ABNORMAL HIGH (ref 83.0–108.0)

## 2021-03-23 LAB — CBC
HCT: 47.5 % (ref 39.0–52.0)
Hemoglobin: 14.5 g/dL (ref 13.0–17.0)
MCH: 28.5 pg (ref 26.0–34.0)
MCHC: 30.5 g/dL (ref 30.0–36.0)
MCV: 93.5 fL (ref 80.0–100.0)
Platelets: 210 10*3/uL (ref 150–400)
RBC: 5.08 MIL/uL (ref 4.22–5.81)
RDW: 14.2 % (ref 11.5–15.5)
WBC: 7 10*3/uL (ref 4.0–10.5)
nRBC: 0 % (ref 0.0–0.2)

## 2021-03-23 LAB — CBG MONITORING, ED
Glucose-Capillary: 127 mg/dL — ABNORMAL HIGH (ref 70–99)
Glucose-Capillary: 90 mg/dL (ref 70–99)

## 2021-03-23 LAB — I-STAT CHEM 8, ED
BUN: 6 mg/dL — ABNORMAL LOW (ref 8–23)
Calcium, Ion: 1.18 mmol/L (ref 1.15–1.40)
Chloride: 106 mmol/L (ref 98–111)
Creatinine, Ser: 0.6 mg/dL — ABNORMAL LOW (ref 0.61–1.24)
Glucose, Bld: 138 mg/dL — ABNORMAL HIGH (ref 70–99)
HCT: 49 % (ref 39.0–52.0)
Hemoglobin: 16.7 g/dL (ref 13.0–17.0)
Potassium: 3.9 mmol/L (ref 3.5–5.1)
Sodium: 138 mmol/L (ref 135–145)
TCO2: 22 mmol/L (ref 22–32)

## 2021-03-23 LAB — GLUCOSE, CAPILLARY: Glucose-Capillary: 88 mg/dL (ref 70–99)

## 2021-03-23 LAB — TROPONIN I (HIGH SENSITIVITY): Troponin I (High Sensitivity): 18 ng/L — ABNORMAL HIGH (ref <18)

## 2021-03-23 LAB — DIFFERENTIAL
Abs Immature Granulocytes: 0.04 10*3/uL (ref 0.00–0.07)
Basophils Absolute: 0 10*3/uL (ref 0.0–0.1)
Basophils Relative: 0 %
Eosinophils Absolute: 0.1 10*3/uL (ref 0.0–0.5)
Eosinophils Relative: 1 %
Immature Granulocytes: 1 %
Lymphocytes Relative: 38 %
Lymphs Abs: 2.6 10*3/uL (ref 0.7–4.0)
Monocytes Absolute: 0.9 10*3/uL (ref 0.1–1.0)
Monocytes Relative: 13 %
Neutro Abs: 3.3 10*3/uL (ref 1.7–7.7)
Neutrophils Relative %: 47 %

## 2021-03-23 LAB — PROTIME-INR
INR: 1 (ref 0.8–1.2)
Prothrombin Time: 12.6 seconds (ref 11.4–15.2)

## 2021-03-23 LAB — MRSA NEXT GEN BY PCR, NASAL: MRSA by PCR Next Gen: NOT DETECTED

## 2021-03-23 LAB — MAGNESIUM: Magnesium: 1.8 mg/dL (ref 1.7–2.4)

## 2021-03-23 LAB — APTT: aPTT: 23 seconds — ABNORMAL LOW (ref 24–36)

## 2021-03-23 LAB — HEMOGLOBIN A1C
Hgb A1c MFr Bld: 6.2 % — ABNORMAL HIGH (ref 4.8–5.6)
Mean Plasma Glucose: 131.24 mg/dL

## 2021-03-23 LAB — RESP PANEL BY RT-PCR (FLU A&B, COVID) ARPGX2
Influenza A by PCR: NEGATIVE
Influenza B by PCR: NEGATIVE
SARS Coronavirus 2 by RT PCR: NEGATIVE

## 2021-03-23 LAB — LACTIC ACID, PLASMA
Lactic Acid, Venous: 2 mmol/L (ref 0.5–1.9)
Lactic Acid, Venous: 1.3 mmol/L (ref 0.5–1.9)

## 2021-03-23 LAB — PHOSPHORUS: Phosphorus: 3.3 mg/dL (ref 2.5–4.6)

## 2021-03-23 LAB — CK: Total CK: 166 U/L (ref 49–397)

## 2021-03-23 MED ORDER — INSULIN ASPART 100 UNIT/ML IJ SOLN
0.0000 [IU] | INTRAMUSCULAR | Status: DC
  Administered 2021-03-23: 2 [IU] via SUBCUTANEOUS

## 2021-03-23 MED ORDER — FENTANYL CITRATE PF 50 MCG/ML IJ SOSY
25.0000 ug | PREFILLED_SYRINGE | Freq: Once | INTRAMUSCULAR | Status: AC
  Administered 2021-03-23: 25 ug via INTRAVENOUS

## 2021-03-23 MED ORDER — DOCUSATE SODIUM 100 MG PO CAPS: 100.0000 mg | ORAL_CAPSULE | Freq: Two times a day (BID) | ORAL | Status: DC | PRN

## 2021-03-23 MED ORDER — POLYETHYLENE GLYCOL 3350 17 G PO PACK: 17.0000 g | PACK | Freq: Every day | ORAL | Status: DC | PRN

## 2021-03-23 MED ORDER — FENTANYL BOLUS VIA INFUSION
25.0000 ug | INTRAVENOUS | Status: DC | PRN
  Administered 2021-03-23: 100 ug via INTRAVENOUS
  Filled 2021-03-23: qty 100

## 2021-03-23 MED ORDER — FENTANYL 2500MCG IN NS 250ML (10MCG/ML) PREMIX INFUSION
25.0000 ug/h | INTRAVENOUS | Status: DC
  Administered 2021-03-23: 75 ug/h via INTRAVENOUS
  Filled 2021-03-23: qty 250

## 2021-03-23 MED ORDER — LEVETIRACETAM IN NACL 500 MG/100ML IV SOLN
500.0000 mg | Freq: Two times a day (BID) | INTRAVENOUS | Status: DC
  Administered 2021-03-23 – 2021-03-26 (×6): 500 mg via INTRAVENOUS
  Filled 2021-03-23 (×7): qty 100

## 2021-03-23 MED ORDER — MIDAZOLAM HCL 2 MG/2ML IJ SOLN
INTRAMUSCULAR | Status: AC
  Filled 2021-03-23: qty 2

## 2021-03-23 MED ORDER — ETOMIDATE 2 MG/ML IV SOLN: 20.0000 mg | Freq: Once | INTRAVENOUS | Status: AC

## 2021-03-23 MED ORDER — ORAL CARE MOUTH RINSE
15.0000 mL | OROMUCOSAL | Status: DC
  Administered 2021-03-23 – 2021-03-24 (×10): 15 mL via OROMUCOSAL

## 2021-03-23 MED ORDER — SODIUM CHLORIDE 0.9% FLUSH
3.0000 mL | Freq: Once | INTRAVENOUS | Status: AC
  Administered 2021-03-23: 3 mL via INTRAVENOUS

## 2021-03-23 MED ORDER — LEVETIRACETAM IN NACL 1000 MG/100ML IV SOLN
1000.0000 mg | Freq: Once | INTRAVENOUS | Status: AC
  Administered 2021-03-23: 1000 mg via INTRAVENOUS

## 2021-03-23 MED ORDER — KETAMINE HCL 50 MG/5ML IJ SOSY
PREFILLED_SYRINGE | INTRAMUSCULAR | Status: AC
  Filled 2021-03-23: qty 5

## 2021-03-23 MED ORDER — ONDANSETRON HCL 4 MG/2ML IJ SOLN: 4.0000 mg | Freq: Four times a day (QID) | INTRAMUSCULAR | Status: DC | PRN

## 2021-03-23 MED ORDER — SODIUM CHLORIDE 0.9 % IV SOLN: INTRAVENOUS | Status: DC

## 2021-03-23 MED ORDER — ETOMIDATE 2 MG/ML IV SOLN
INTRAVENOUS | Status: AC
  Administered 2021-03-23: 20 mg via INTRAVENOUS
  Filled 2021-03-23: qty 20

## 2021-03-23 MED ORDER — CHLORHEXIDINE GLUCONATE CLOTH 2 % EX PADS
6.0000 | MEDICATED_PAD | Freq: Every day | CUTANEOUS | Status: DC
  Administered 2021-03-23 – 2021-03-26 (×3): 6 via TOPICAL

## 2021-03-23 MED ORDER — ALBUTEROL SULFATE (2.5 MG/3ML) 0.083% IN NEBU
2.5000 mg | INHALATION_SOLUTION | RESPIRATORY_TRACT | Status: DC | PRN
  Administered 2021-03-27: 2.5 mg via RESPIRATORY_TRACT
  Filled 2021-03-23: qty 3

## 2021-03-23 MED ORDER — LACTATED RINGERS IV BOLUS
500.0000 mL | Freq: Once | INTRAVENOUS | Status: AC
  Administered 2021-03-23: 500 mL via INTRAVENOUS

## 2021-03-23 MED ORDER — HYDRALAZINE HCL 20 MG/ML IJ SOLN
10.0000 mg | Freq: Four times a day (QID) | INTRAMUSCULAR | Status: DC | PRN
  Administered 2021-03-23: 10 mg via INTRAVENOUS
  Filled 2021-03-23: qty 1

## 2021-03-23 MED ORDER — FENTANYL CITRATE PF 50 MCG/ML IJ SOSY
PREFILLED_SYRINGE | INTRAMUSCULAR | Status: AC
  Filled 2021-03-23: qty 2

## 2021-03-23 MED ORDER — CHLORHEXIDINE GLUCONATE 0.12% ORAL RINSE (MEDLINE KIT)
15.0000 mL | Freq: Two times a day (BID) | OROMUCOSAL | Status: DC
  Administered 2021-03-23 – 2021-03-24 (×2): 15 mL via OROMUCOSAL

## 2021-03-23 MED ORDER — SUCCINYLCHOLINE CHLORIDE 200 MG/10ML IV SOSY
PREFILLED_SYRINGE | INTRAVENOUS | Status: AC
  Filled 2021-03-23: qty 10

## 2021-03-23 MED ORDER — LORAZEPAM 2 MG/ML IJ SOLN
INTRAMUSCULAR | Status: AC
  Administered 2021-03-23: 1 mg via INTRAVENOUS
  Filled 2021-03-23: qty 1

## 2021-03-23 MED ORDER — IOHEXOL 350 MG/ML SOLN
75.0000 mL | Freq: Once | INTRAVENOUS | Status: AC | PRN
  Administered 2021-03-23: 75 mL via INTRAVENOUS

## 2021-03-23 MED ORDER — PROPOFOL 1000 MG/100ML IV EMUL
0.0000 ug/kg/min | INTRAVENOUS | Status: DC
  Administered 2021-03-23: 20 ug/kg/min via INTRAVENOUS
  Administered 2021-03-23: 40 ug/kg/min via INTRAVENOUS
  Administered 2021-03-24: 04:00:00 20 ug/kg/min via INTRAVENOUS
  Filled 2021-03-23 (×3): qty 100

## 2021-03-23 MED ORDER — LORAZEPAM 2 MG/ML IJ SOLN: 1.0000 mg | Freq: Once | INTRAMUSCULAR | Status: AC

## 2021-03-23 MED ORDER — ROCURONIUM BROMIDE 10 MG/ML (PF) SYRINGE
PREFILLED_SYRINGE | INTRAVENOUS | Status: AC
  Filled 2021-03-23: qty 10

## 2021-03-23 MED ORDER — MIDAZOLAM HCL 2 MG/2ML IJ SOLN
2.0000 mg | INTRAMUSCULAR | Status: AC | PRN
  Filled 2021-03-23: qty 2

## 2021-03-23 MED ORDER — SUCCINYLCHOLINE CHLORIDE 200 MG/10ML IV SOSY: 125.0000 mg | PREFILLED_SYRINGE | Freq: Once | INTRAVENOUS | Status: DC

## 2021-03-23 MED ORDER — SODIUM CHLORIDE 0.9 % IV SOLN: 2000.0000 mg | Freq: Once | INTRAVENOUS | Status: DC

## 2021-03-23 NOTE — H&P (Signed)
Dennis Levine, MRN:  [id number], DOB:  13-Aug-1950, LOS: 0 ADMISSION DATE:  03/23/2021, CONSULTATION DATE:  03/23/2021 REFERRING MD:  Eulis Foster, CHIEF COMPLAINT:  Seizures requiring intubation    History of Present Illness:  All history obtained from the chart as patient is intubated and sedated upon assessment  70 year old male never smoker with history of prostate cancer, Hepatitis C , and hypertension found down at home by family 12/13 after they heard him collapse in the bathroom. EMS was called , the patient seized during transport to [facility] , and Code Stroke was called. Patient went straight to CT imaging, and upon arrival to ED room was found hypoxic, foaming at the mouth with tachypnea. Immediate resuscitation was started including face mask and oxygen. Pt. Was intubated in the ED for concern of ingoing seizures and inability to protect airway. He is currently on propofol and fentanyl for sedation.   Per wife patient had not been feeling well for several days, but complaints were non-specific  In the ED Labs included  ABG : 7.44/ 401/27.1/Sat 100% Na 138/ K 3.9/ Chloride 106/ BUN 6/ Creatinine 0.60/ Glucose 138 WBC  7/ HGB 15.4/ Platelets 240 APTT >> 23, INR is 1  Pt is afebrile and hypertensive   PCCM were consulted to assist with care  Pertinent  Medical History    Past Medical History:  Diagnosis Date   Cancer [facility])    prostate   Hepatitis    hepatitis c    Hypertension      Significant Hospital Events: Including procedures, antibiotic start and stop dates in addition to other pertinent events   Admission 03/23/2021 with seizures after being found down at home   Interim History / Subjective:  Sedated and intubated. Hypertensive   Objective   Blood pressure (!) 204/119, pulse 96, temperature (!) 96.9 F (36.1 C), resp. rate 16, height 5\' 10"  (1.778 m), weight 60 kg, SpO2 96 %.    Vent Mode: PRVC FiO2 (%):  [100 %] 100 % Set Rate:  [16 bmp] 16 bmp Vt Set:   [580 mL] 580 mL PEEP:  [5 cmH20] 5 cmH20 Plateau Pressure:  [15 cmH20] 15 cmH20   Intake/Output Summary (Last 24 hours) at 03/23/2021 1246 Last data filed at 03/23/2021 1206 Gross per 24 hour  Intake 100 ml  Output --  Net 100 ml   Filed Weights   03/23/21 1100  Weight: 60 kg    Examination: General: Acute thin ill appearing elderly male lying in bed on mechanical ventilation, in NAD HEENT: ETT, MM pink/moist, PERRL,  Neuro: Sedated on vent CV: s1s2 regular rate and rhythm, no murmur, rubs, or gallops,  PULM:  Clear bilaterally, no increased work of breathing, tolerating vent  GI: soft, bowel sounds active in all 4 quadrants, non-tender, non-distended Extremities: warm/dry, no edema  Skin: no rashes or lesions  Resolved Hospital Problem list     Assessment & Plan:  New onset seizures   Diffuse intracranial atherosclerosis. -Severe stenosis of the nondominant left vertebral artery origin, left vertebral artery terminates in PICA.  Right vertebral artery patent. Moderate stenosis of the left MCA M2 inferior division.    P: Management per neurology  Maintain neuro protective measures; goal for eurothermia, euglycemia, eunatermia, normoxia, and PCO2 goal of 35-40 Nutrition and bowel regiment  Seizure precautions  AEDs per neurology  Aspirations precautions  EEG per neuro  MRI brain   Acute Hypoxic Respiratory Failure  -Intubated in ED for airway  protection  P: Continue ventilator support with lung protective strategies  Wean PEEP and FiO2 for sats greater than 90%. Head of bed elevated 30 degrees. Plateau pressures less than 30 cm H20.  Follow intermittent chest x-ray and ABG.   SAT/SBT as tolerated, mentation preclude extubation  Ensure adequate pulmonary hygiene  Follow cultures  VAP bundle in place  PAD protocol  Hyperglycemia  -No known history of diabetes  P: SSI CBG checks q4 CBG goal 140-180  Hx of HTN  -Home medications include Norvasc  P: PRN IV  hydralazine  May need to consider Celviprex drip  Continuous telemetry   Best Practice (right click and "Reselect all SmartList Selections" daily)   Diet/type: NPO DVT prophylaxis: SCD GI prophylaxis: PPI Lines: N/A Foley:  N/A Code Status:  full code Last date of multidisciplinary goals of care discussion: Pending   Labs   CBC: Recent Labs  Lab 03/23/21 1107 03/23/21 1110 03/23/21 1236  WBC 7.0  --   --   NEUTROABS 3.3  --   --   HGB 14.5 16.7 13.9  HCT 47.5 49.0 41.0  MCV 93.5  --   --   PLT 210  --   --     Basic Metabolic Panel: Recent Labs  Lab 03/23/21 1107 03/23/21 1110 03/23/21 1236  NA 136 138 138  K 4.0 3.9 3.9  CL 104 106  --   CO2 19*  --   --   GLUCOSE 142* 138*  --   BUN 7* 6*  --   CREATININE 0.90 0.60*  --   CALCIUM 8.8*  --   --    GFR: Estimated Creatinine Clearance: 71.9 mL/min (A) (by C-G formula based on SCr of 0.6 mg/dL (L)). Recent Labs  Lab 03/23/21 1107  WBC 7.0    Liver Function Tests: Recent Labs  Lab 03/23/21 1107  AST 48*  ALT 32  ALKPHOS 69  BILITOT 0.6  PROT 7.0  ALBUMIN 3.5   No results for input(s): LIPASE, AMYLASE in the last 168 hours. No results for input(s): AMMONIA in the last 168 hours.  ABG    Component Value Date/Time   PHART 7.446 03/23/2021 1236   PCO2ART 38.9 03/23/2021 1236   PO2ART 401 (H) 03/23/2021 1236   HCO3 27.1 03/23/2021 1236   TCO2 28 03/23/2021 1236   O2SAT 100.0 03/23/2021 1236     Coagulation Profile: Recent Labs  Lab 03/23/21 1107  INR 1.0    Cardiac Enzymes: No results for input(s): CKTOTAL, CKMB, CKMBINDEX, TROPONINI in the last 168 hours.  HbA1C: No results found for: HGBA1C  CBG: Recent Labs  Lab 03/23/21 1104  GLUCAP 127*    Review of Systems:   Unable to assess   Past Medical History:  He,  has a past medical history of Cancer ([place]), Hepatitis, and Hypertension.   Surgical History:   Past Surgical History:  Procedure Laterality Date   4th index  finger straightened  1990's   finger surgery for injury  1970's right hand   PELVIC LYMPH NODE DISSECTION N/A 02/03/2016   Procedure: PELVIC LYMPH NODE DISSECTION;  Surgeon: Ardis Hughs, MD;  Location: [facility];  Service: Urology;  Laterality: N/A;   ROBOT ASSISTED LAPAROSCOPIC RADICAL PROSTATECTOMY N/A 02/03/2016   Procedure: XI ROBOTIC ASSISTED LAPAROSCOPIC RADICAL PROSTATECTOMY;  Surgeon: Ardis Hughs, MD;  Location: [facility];  Service: Urology;  Laterality: N/A;     Social History:   reports that he has never smoked. He  has never used smokeless tobacco. He reports current alcohol use. He reports current drug use. Drug: Marijuana.   Family History:  His family history is not on file.   Allergies No Known Allergies   Home Medications  Prior to Admission medications   Medication Sig Start Date End Date Taking? Authorizing Provider  amLODipine (NORVASC) 10 MG tablet Take 1 tablet (10 mg total) by mouth daily. 03/10/20 04/22/21 Yes Faustino Congress, NP  docusate sodium (COLACE) 100 MG capsule Take 1 capsule (100 mg total) by mouth 2 (two) times daily. Patient not taking: Reported on 03/23/2021 02/04/16   Ardis Hughs, MD  oxyCODONE (OXY IR/ROXICODONE) 5 MG immediate release tablet Take 1-2 tablets (5-10 mg total) by mouth every 4 (four) hours as needed for moderate pain. Patient not taking: Reported on 03/23/2021 02/04/16   Ardis Hughs, MD  sulfamethoxazole-trimethoprim (BACTRIM DS,SEPTRA DS) 800-160 MG tablet Take 1 tablet by mouth 2 (two) times daily. Start taking one day prior to your appointment for your first follow-up and catheter removal.  Continue taking for three days. Patient not taking: Reported on 03/23/2021 02/04/16   Ardis Hughs, MD     Critical care time:    CRITICAL CARE Performed by: Shenicka Sunderlin D. Harris   Total critical care time: 45 minutes  Critical care time was exclusive of separately billable procedures and treating other  patients.  Critical care was necessary to treat or prevent imminent or life-threatening deterioration.  Critical care was time spent personally by me on the following activities: development of treatment plan with patient and/or surrogate as well as nursing, discussions with consultants, evaluation of patient's response to treatment, examination of patient, obtaining history from patient or surrogate, ordering and performing treatments and interventions, ordering and review of laboratory studies, ordering and review of radiographic studies, pulse oximetry and re-evaluation of patient's condition.  Rylin Saez D. Kenton Kingfisher, NP-C [facility] Personal contact information can be found on Amion  03/23/2021, 1:22 PM

## 2021-03-23 NOTE — ED Notes (Signed)
MRI would like a call when EEG stickers are placed and MRI safe.

## 2021-03-23 NOTE — Procedures (Signed)
Patient Name: Dennis Levine  MRN: [id number]  Epilepsy Attending: Lora Havens  Referring Physician/Provider: Dr Amie [place] Date: 03/23/2021 Duration: 24.29 mins  Patient history:  70 year old being brought in for evaluation for leftward gaze, seizures followed by left-sided weakness. EEG to evaluate for seizure  Level of alertness: Asleep  AEDs during EEG study: LEV, ativan, propofol  Technical aspects: This EEG study was done with scalp electrodes positioned according to the 10-20 International system of electrode placement. Electrical activity was acquired at a sampling rate of 500Hz  and reviewed with a high frequency filter of 70Hz  and a low frequency filter of 1Hz . EEG data were recorded continuously and digitally stored.   Description: EEG showed continuous generalized low amplitude 3-6 theta-delta slowing admixed with 15 to 18 Hz generalized beta activity predominantly in bilateral frontocentral regions. Frequent spikes were noted in right frontal region. Hyperventilation and photic stimulation were not performed.     ABNORMALITY -Spike, right frontal region -Continuous slow, generalized  IMPRESSION: This study showed evidence of epileptogenicity arising from right frontal region. Additionally there is severe diffuse encephalopathy, nonspecific etiology but most likely due to sedation. No seizures were seen throughout the recording.  Dr Rory Percy was notified.  [name] [name]

## 2021-03-23 NOTE — Code Documentation (Addendum)
Dennis Levine is a 70 yr old male with h/o  Prostate CA and Hepatitis. He was last known well at 1013 when he became ubtunded at work. Pt seized in EMS en route, 5 mg ativan given. Pt stuporous on arrival. Airway cleared at bridge, CBG and labs obtained. Taken to CT scan at 1105. See timeline for full NIH and times. CTH neg for acute hemorrhage per Dr Rory Percy. Pt hypertensive, SBP 250. 10 mg labetelol given IV by SRN in CT scan. CTA then obtained. BP came down to 140/84. Per Dr Rory Percy, CTA negative for LVO. Pt taken to room 13 for possible intubation. Pt intubated by EDP at 1142. Pt will need continued VS and mNIHSS q 2 hr while his workup continues. No thrombolytic as non-focal exam (low suspicion stroke). No NIR as LVO negative.

## 2021-03-23 NOTE — Progress Notes (Signed)
Patient transported to 2M12 from ED without complications. RN at bedside.

## 2021-03-23 NOTE — Consult Note (Signed)
Neurology Consultation  Reason for Consult: Altered mental status, seizure Referring Physician: Dr. Eulis Foster  History obtained from Jeanine Luz T: [id number]  CC: Altered mental status, seizure, left-sided gaze  History is obtained from: EMS, family member at the above phone number  HPI: Dennis Levine is a 70 y.o. male past medical history of prostate cancer, hepatitis, hypertension, alcohol abuse, presented to the emergency room from work where he was noted to be found unresponsive. Last known well was about 10:13 AM when the coworker/niece saw him and right after heard a thud in the bathroom as if someone had fallen.  He was down on the ground and nonresponsive.  EMS was called.  They noted him to have a leftward gaze and seizure activity.  He also looked like he had had bowel incontinence.  In the truck, he had another seizure and received 5 mg of Versed IM. He was extremely stuporous and sonorous breathing while being seen at the ER bridge. Noncontrast head CT was done as an acute code stroke evaluation-negative for acute process.  Aspects 10. Exam was limited due to his mentation but was nonfocal. Did not have any gaze preference or deviation at the time of this examination. Systolic blood pressures in the high 250s on arrival.  Give a dose of labetalol in the CT scanner prior to obtaining CTA-systolic blood pressures down to 140. Unable, emergently intubated upon completion of imaging  LKW: 1013 hrs tpa given?: no, nonfocal exam Premorbid modified Rankin scale (mRS): 2   ROS: Unable to obtain due to altered mental status.   Past Medical History:  Diagnosis Date   Cancer [facility])    prostate   Hepatitis    hepatitis c    Hypertension     No family history on file.  Social History:   reports that he has never smoked. He has never used smokeless tobacco. He reports current alcohol use. He reports current drug use. Drug: Marijuana.  Medications  Current  Facility-Administered Medications:    sodium chloride flush (NS) 0.9 % injection 3 mL, 3 mL, Intravenous, Once, Carmin Muskrat, MD  Current Outpatient Medications:    amLODipine (NORVASC) 10 MG tablet, Take 1 tablet (10 mg total) by mouth daily., Disp: 90 tablet, Rfl: 2   docusate sodium (COLACE) 100 MG capsule, Take 1 capsule (100 mg total) by mouth 2 (two) times daily., Disp: 60 capsule, Rfl: 0   oxyCODONE (OXY IR/ROXICODONE) 5 MG immediate release tablet, Take 1-2 tablets (5-10 mg total) by mouth every 4 (four) hours as needed for moderate pain., Disp: 15 tablet, Rfl: 0   sulfamethoxazole-trimethoprim (BACTRIM DS,SEPTRA DS) 800-160 MG tablet, Take 1 tablet by mouth 2 (two) times daily. Start taking one day prior to your appointment for your first follow-up and catheter removal.  Continue taking for three days., Disp: 6 tablet, Rfl: 0  Exam: Current vital signs: BP (!) 204/119    Pulse 96    Temp (!) 96.9 F (36.1 C)    Resp 16    Ht 5\' 10"  (1.778 m)    Wt 60 kg    SpO2 96%    BMI 18.98 kg/m  Vital signs in last 24 hours: Temp:  [95.8 F (35.4 C)-98.4 F (36.9 C)] 96.9 F (36.1 C) (12/13 1230) Pulse Rate:  [95-105] 96 (12/13 1200) Resp:  [16-43] 16 (12/13 1230) BP: (136-204)/(96-119) 204/119 (12/13 1230) SpO2:  [96 %-100 %] 96 % (12/13 1200) FiO2 (%):  [100 %] 100 % (12/13 1151) Weight:  [  60 kg] 60 kg (12/13 1100) General: Stuporous, does not open eyes to voice.  Does not follow commands. HEENT: Normocephalic/atraumatic CVS: Regular rate rhythm Respiratory: Sonorous respirations, tachypneic, scattered Rales bilaterally Extremities warm well perfused Neurological exam Stuporous, nearly obtunded No spontaneous movements Does not follow commands Nonverbal Pupils equal round reactive light, extraocular movements difficult to assess-gaze is midline.  Face appears symmetric. Motor exam with no spontaneous movement initially no movement to noxious stimulation but later on as he came  around some, spontaneous movement with both upper and lower extremities and brisk withdrawal to noxious stimulation in all fours without focality.  There was initial concern on the scene by EMS for some left-sided gaze and left-sided shaking followed by left-sided weakness which I did not personally observe. Sensory exam: As above Coordination difficult to assess given his mentation NIH stroke scale 1a Level of Conscious.: 2 1b LOC Questions: 2 1c LOC Commands: 2 2 Best Gaze: 0 3 Visual: 0 4 Facial Palsy: 0 5a Motor Arm - left: 2 5b Motor Arm - Right: 2 6a Motor Leg - Left: 2 6b Motor Leg - Right: 2 7 Limb Ataxia: 0 8 Sensory: 0 9 Best Language: 3 10 Dysarthria: 2 11 Extinct. and Inatten.: 0 TOTAL: 19    Labs I have reviewed labs in epic and the results pertinent to this consultation are:   CBC    Component Value Date/Time   WBC 7.0 03/23/2021 1107   RBC 5.08 03/23/2021 1107   HGB 13.9 03/23/2021 1236   HCT 41.0 03/23/2021 1236   PLT 210 03/23/2021 1107   MCV 93.5 03/23/2021 1107   MCH 28.5 03/23/2021 1107   MCHC 30.5 03/23/2021 1107   RDW 14.2 03/23/2021 1107   LYMPHSABS 2.6 03/23/2021 1107   MONOABS 0.9 03/23/2021 1107   EOSABS 0.1 03/23/2021 1107   BASOSABS 0.0 03/23/2021 1107    CMP     Component Value Date/Time   NA 138 03/23/2021 1236   K 3.9 03/23/2021 1236   CL 106 03/23/2021 1110   CO2 19 (L) 03/23/2021 1107   GLUCOSE 138 (H) 03/23/2021 1110   BUN 6 (L) 03/23/2021 1110   CREATININE 0.60 (L) 03/23/2021 1110   CREATININE 0.91 08/31/2015 1135   CALCIUM 8.8 (L) 03/23/2021 1107   PROT 7.0 03/23/2021 1107   ALBUMIN 3.5 03/23/2021 1107   AST 48 (H) 03/23/2021 1107   ALT 32 03/23/2021 1107   ALKPHOS 69 03/23/2021 1107   BILITOT 0.6 03/23/2021 1107   GFRNONAA >60 03/23/2021 1107   GFRNONAA 88 08/31/2015 1135   GFRAA >60 02/03/2016 1726   GFRAA >89 08/31/2015 1135   Imaging I have reviewed the images obtained:  CT-head, CT angiography head, CT  angiography neck-acute code stroke studies: No emergent LVO.  CT head with no acute changes, chronic small vessel disease with aspects 10 there is a 15 mm left parietal meningioma Severe stenosis of the nondominant left vertebral artery origin, left vertebral artery terminates in PICA.  Right vertebral artery patent.  Moderate stenosis of the left MCA M2 inferior division.  Diffuse intracranial atherosclerosis.   Assessment: 70 year old with above past medical history being brought in for evaluation for leftward gaze, seizures followed by left-sided weakness-no known history of seizure disorder, questionable history of alcohol use-unclear to what extent, noncontrast head CT and CT angiography head and neck with no emergent LVO as well as exam more consistent with postictal encephalopathy rather than focal stroke deficits precluding IV thrombolysis and no emergent large  vessel occlusion on vascular imaging precludes endovascular intervention Impression: - New onset seizure - Evaluate for alcohol withdrawal versus other causes for new onset seizure - Low on the differential-rule out stroke  Recommendations: Stat EEG If there is any focality or concern for epileptogenicity, will continue longer-term EEG monitoring, in the absence of any focality of EEG, would recommend MRI to be done after EEG Keppra load 2 g IV x1 followed by Keppra 500 twice daily Check chest x-ray, urinalysis, toxicology screen Plan d/w Dr. Eulis Foster in the ER Neurology will follow.   Addendum Patient's EEG has been completed, shows right frontal spikes. Given his leftward gaze and left-sided shaking, concern for right frontal focus for his seizure. Will need an MRI at some point but I would also want to continue continuous EEG. We will hookup MRI safe EEG leads and also order an MRI.  The EEG can be discontinued for the duration of the MRI and can be restarted once the MRI is completed. If it is done during the hours that the  EEG technologist are present, they can be reached to assist with stopping and reinitiating the studies. Plan was relayed to the admitting CCM team   -- Amie Portland, MD Neurologist [facility] Pager: [phone]   CRITICAL CARE ATTESTATION Performed by: Amie Portland, MD Total critical care time: 45 minutes Critical care time was exclusive of separately billable procedures and treating other patients and/or supervising APPs/Residents/Students Critical care was necessary to treat or prevent imminent or life-threatening deterioration due to strokelike symptoms, seizures, status epilepticus This patient is critically ill and at significant risk for neurological worsening and/or death and care requires constant monitoring. Critical care was time spent personally by me on the following activities: development of treatment plan with patient and/or surrogate as well as nursing, discussions with consultants, evaluation of patient's response to treatment, examination of patient, obtaining history from patient or surrogate, ordering and performing treatments and interventions, ordering and review of laboratory studies, ordering and review of radiographic studies, pulse oximetry, re-evaluation of patient's condition, participation in multidisciplinary rounds and medical decision making of high complexity in the care of this patient.

## 2021-03-23 NOTE — ED Notes (Signed)
EEG at bedside.

## 2021-03-23 NOTE — ED Notes (Signed)
Lactic Acid 2.2

## 2021-03-23 NOTE — Progress Notes (Signed)
Applied MRI compatible electrodes.  Started cEEG study.

## 2021-03-23 NOTE — ED Provider Notes (Signed)
[place] EMERGENCY DEPARTMENT Provider Note   CSN: [id number] Arrival date & time: 03/23/21  1102  An emergency department physician performed an initial assessment on this suspected stroke patient at 1104.  History Chief Complaint  Patient presents with   Code Stroke    Dennis Levine is a 70 y.o. male. Patient seen by me at 11:33 AM after return from CT imaging. HPI On arrival into the room the patient was hypoxic, with foaming at the mouth, and tachypnea.  Immediate resuscitation included facemask oxygen.  Monitoring devices placed.  Patient presented as code stroke, after having a seizure during EMS transport.  He was apparently "found down," at home after family heard him collapse in the bathroom.  Level 5 caveat-unresponsive    Past Medical History:  Diagnosis Date   Cancer [facility])    prostate   Hepatitis    hepatitis c    Hypertension     Patient Active Problem List   Diagnosis Date Noted   Prostate cancer ([place]) 02/03/2016    Past Surgical History:  Procedure Laterality Date   4th index finger straightened  1990's   finger surgery for injury  1970's right hand   PELVIC LYMPH NODE DISSECTION N/A 02/03/2016   Procedure: PELVIC LYMPH NODE DISSECTION;  Surgeon: Ardis Hughs, MD;  Location: [facility] ORS;  Service: Urology;  Laterality: N/A;   ROBOT ASSISTED LAPAROSCOPIC RADICAL PROSTATECTOMY N/A 02/03/2016   Procedure: XI ROBOTIC ASSISTED LAPAROSCOPIC RADICAL PROSTATECTOMY;  Surgeon: Ardis Hughs, MD;  Location: [facility] ORS;  Service: Urology;  Laterality: N/A;       No family history on file.  Social History   Tobacco Use   Smoking status: Never   Smokeless tobacco: Never  Substance Use Topics   Alcohol use: Yes    Comment: 6 pack beer most days   Drug use: Yes    Types: Marijuana    Comment: marijuana occ last used 01-31-16    Home Medications Prior to Admission medications   Medication Sig Start Date End Date Taking?  Authorizing Provider  amLODipine (NORVASC) 10 MG tablet Take 1 tablet (10 mg total) by mouth daily. 03/10/20 06/08/20  Faustino Congress, NP  docusate sodium (COLACE) 100 MG capsule Take 1 capsule (100 mg total) by mouth 2 (two) times daily. 02/04/16   Ardis Hughs, MD  oxyCODONE (OXY IR/ROXICODONE) 5 MG immediate release tablet Take 1-2 tablets (5-10 mg total) by mouth every 4 (four) hours as needed for moderate pain. 02/04/16   Ardis Hughs, MD  sulfamethoxazole-trimethoprim (BACTRIM DS,SEPTRA DS) 800-160 MG tablet Take 1 tablet by mouth 2 (two) times daily. Start taking one day prior to your appointment for your first follow-up and catheter removal.  Continue taking for three days. 02/04/16   Ardis Hughs, MD    Allergies    Patient has no known allergies.  Review of Systems   Review of Systems  Unable to perform ROS: Mental status change   Physical Exam Updated Vital Signs BP (!) 150/110    Pulse (!) 105    Temp 98.4 F (36.9 C)    Resp 16    Ht 5\' 10"  (1.778 m)    Wt 60 kg    SpO2 99%    BMI 18.98 kg/m   Physical Exam Vitals and nursing note reviewed.  Constitutional:      General: He is in acute distress.     Appearance: He is well-developed. He is ill-appearing.  HENT:  Head: Normocephalic and atraumatic.     Right Ear: External ear normal.     Left Ear: External ear normal.     Nose: No congestion.     Mouth/Throat:     Comments: Saliva in mouth Eyes:     Conjunctiva/sclera: Conjunctivae normal.     Pupils: Pupils are equal, round, and reactive to light.  Cardiovascular:     Rate and Rhythm: Normal rate.  Pulmonary:     Effort: Pulmonary effort is normal.  Abdominal:     General: There is no distension.     Palpations: Abdomen is soft.     Tenderness: There is no abdominal tenderness.  Musculoskeletal:        General: No swelling or deformity. Normal range of motion.     Cervical back: Normal range of motion and neck supple.  Skin:     General: Skin is warm and dry.  Neurological:     Mental Status: He is alert and oriented to person, place, and time.     Cranial Nerves: No cranial nerve deficit.     Motor: No abnormal muscle tone.     Comments: Involuntary movements of the arms, flexion bilaterally, no clear evidence for seizure activity.  No facial twitching.  Equal tone in all extremities.  Psychiatric:     Comments: Obtunded    ED Results / Procedures / Treatments   Labs (all labs ordered are listed, but only abnormal results are displayed) Labs Reviewed  APTT - Abnormal; Notable for the following components:      Result Value   aPTT 23 (*)    All other components within normal limits  COMPREHENSIVE METABOLIC PANEL - Abnormal; Notable for the following components:   CO2 19 (*)    Glucose, Bld 142 (*)    BUN 7 (*)    Calcium 8.8 (*)    AST 48 (*)    All other components within normal limits  I-STAT CHEM 8, ED - Abnormal; Notable for the following components:   BUN 6 (*)    Creatinine, Ser 0.60 (*)    Glucose, Bld 138 (*)    All other components within normal limits  CBG MONITORING, ED - Abnormal; Notable for the following components:   Glucose-Capillary 127 (*)    All other components within normal limits  PROTIME-INR  CBC  DIFFERENTIAL  BLOOD GAS, ARTERIAL    EKG EKG Interpretation  Date/Time:  Tuesday March 23 2021 12:01:30 EST Ventricular Rate:  94 PR Interval:  155 QRS Duration: 93 QT Interval:  412 QTC Calculation: 516 R Axis:   51 Text Interpretation: Sinus rhythm Biatrial enlargement RSR' in V1 or V2, probably normal variant Prolonged QT interval Since last tracing QT has lengthened Confirmed by Daleen Bo [phone]) on 03/23/2021 12:04:59 PM  Radiology CT HEAD CODE STROKE WO CONTRAST  Result Date: 03/23/2021 CLINICAL DATA:  Code stroke.  Old deficit, acute, stroke suspected EXAM: CT HEAD WITHOUT CONTRAST TECHNIQUE: Contiguous axial images were obtained from the base of the skull  through the vertex without intravenous contrast. COMPARISON:  10/07/2020 FINDINGS: Brain: Generalized atrophy. No sign of acute infarction, mass lesion, hemorrhage, hydrocephalus or extra-axial collection. Mild chronic small-vessel ischemic change of the white matter. Old lacunar infarction left caudate. Vascular: There is atherosclerotic calcification of the major vessels at the base of the brain. Skull: Negative Sinuses/Orbits: Clear/normal Other: Right forehead scalp lipoma. ASPECTS [facility] Stroke Program Early CT Score) - Ganglionic level infarction (caudate, lentiform nuclei, internal capsule,  insula, M1-M3 cortex): 7 - Supraganglionic infarction (M4-M6 cortex): 3 Total score (0-10 with 10 being normal): 10 IMPRESSION: 1. No acute CT finding. Atrophy. Chronic small-vessel ischemic changes of the white matter. Old left caudate lacunar infarction. 2. ASPECTS is 10 3. These results were communicated to Dr. Rory Percy at 11:19 am on 03/23/2021 by text page via the [facility] messaging system. Electronically Signed   By: Nelson Chimes M.D.   On: 03/23/2021 11:20   CT ANGIO HEAD NECK W WO CM (CODE STROKE)  Result Date: 03/23/2021 CLINICAL DATA:  Stroke. Follow-up. Neurological deficit, acute, stroke suspected. EXAM: CT ANGIOGRAPHY HEAD AND NECK TECHNIQUE: Multidetector CT imaging of the head and neck was performed using the standard protocol during bolus administration of intravenous contrast. Multiplanar CT image reconstructions and MIPs were obtained to evaluate the vascular anatomy. Carotid stenosis measurements (when applicable) are obtained utilizing NASCET criteria, using the distal internal carotid diameter as the denominator. CONTRAST:  61mL OMNIPAQUE IOHEXOL 350 MG/ML SOLN COMPARISON:  Head CT earlier same day FINDINGS: CTA NECK FINDINGS Aortic arch: Aortic arch is normal. Branching pattern is normal without origin stenosis. Right carotid system: Common carotid artery is widely patent to the bifurcation. There is  soft and calcified plaque at the carotid bifurcation and ICA bulb. There is no stenosis. Cervical ICA is patent to the skull base. Left carotid system: Common carotid artery is patent to the bifurcation with scattered soft plaque but no stenosis. Carotid bifurcation and ICA bulb show calcified more than soft plaque. No stenosis. Cervical ICA is patent beyond that. Vertebral arteries: Calcified plaque at both vertebral artery origins. The right vertebral artery is dominant. No stenosis at the right vertebral artery origin. Severe stenosis of the non dominant left vertebral artery origin. Both vessels patent beyond that through the cervical region to the foramen magnum. Skeleton: Scoliotic curvature convex to the right. Mid cervical spondylosis. Other neck: No mass or lymphadenopathy. Nasal intubation with the tip in the oropharynx. Upper chest: Lung apices are clear. Review of the MIP images confirms the above findings CTA HEAD FINDINGS Anterior circulation: Both internal carotid arteries are widely patent through the skull base. There is atherosclerotic calcification throughout both carotid siphon regions with stenosis estimated at 50% on both sides. The anterior and middle cerebral vessels are patent on both sides. There is moderate stenosis of the left middle cerebral artery inferior division M2 branch. Distal branch vessels do show some atherosclerotic irregularity. No evidence of large vessel occlusion or correctable proximal stenosis. Posterior circulation: Both vertebral arteries are patent through the foramen magnum. The left terminates in PICA. Dominant right vertebral artery supplies the basilar. Superior cerebellar and posterior cerebral arteries show flow. There is atherosclerotic irregularity in the more distal PCA branches. Venous sinuses: Patent and normal. Anatomic variants: None significant. 15 mm enhancing meningioma in the right posterior parietal region without significant mass-effect upon the  brain. Review of the MIP images confirms the above findings IMPRESSION: No large vessel occlusion. Atherosclerotic change at both carotid bifurcations without stenosis. Severe stenosis of the non dominant left vertebral artery origin. Left vertebral artery terminates in PICA. Right vertebral artery widely patent. Atherosclerotic disease in both carotid siphon regions with stenosis estimated at 50% on both sides. Moderate stenosis of the left middle cerebral artery inferior division M2 branch. Atherosclerotic narrowing and irregularity within the more distal branch vessels of the anterior, middle and posterior cerebral artery territories. 15 mm right parietal meningioma without significant mass-effect upon the brain. Electronically Signed  By: Nelson Chimes M.D.   On: 03/23/2021 11:40    Procedures Procedure Name: Intubation Date/Time: 03/23/2021 11:54 AM Performed by: Daleen Bo, MD Pre-anesthesia Checklist: Patient identified and Patient being monitored Oxygen Delivery Method: Non-rebreather mask Preoxygenation: Pre-oxygenation with 100% oxygen Induction Type: Rapid sequence Laryngoscope Size: 4 Grade View: Grade I Tube type: Subglottic suction tube Tube size: 7.5 mm Number of attempts: 1 Placement Confirmation: ETT inserted through vocal cords under direct vision, Positive ETCO2 and Breath sounds checked- equal and bilateral Secured at: 25 cm Tube secured with: ETT holder Dental Injury: Teeth and Oropharynx as per pre-operative assessment       Medications Ordered in ED Medications  levETIRAcetam (KEPPRA) IVPB 1000 mg/100 mL premix (1,000 mg Intravenous New Bag/Given 03/23/21 1204)    And  levETIRAcetam (KEPPRA) IVPB 1000 mg/100 mL premix (1,000 mg Intravenous New Bag/Given 03/23/21 1204)  fentaNYL 2546mcg in NS 226mL (20mcg/ml) infusion-PREMIX (has no administration in time range)  fentaNYL (SUBLIMAZE) bolus via infusion 25-100 mcg (has no administration in time range)  propofol  (DIPRIVAN) 1000 MG/100ML infusion (has no administration in time range)  sodium chloride flush (NS) 0.9 % injection 3 mL (3 mLs Intravenous Given 03/23/21 1202)  iohexol (OMNIPAQUE) 350 MG/ML injection 75 mL (75 mLs Intravenous Contrast Given 03/23/21 1128)  etomidate (AMIDATE) injection 20 mg (20 mg Intravenous Given 03/23/21 1203)  LORazepam (ATIVAN) injection 1 mg (1 mg Intravenous Given 03/23/21 1203)  fentaNYL (SUBLIMAZE) injection 25 mcg (25 mcg Intravenous Given 03/23/21 1205)    ED Course  I have reviewed the triage vital signs and the nursing notes.  Pertinent labs & imaging results that were available during my care of the patient were reviewed by me and considered in my medical decision making (see chart for details).  Clinical Course as of 03/23/21 1205  Tue Mar 23, 2021  1153 Patient not protecting airway with possible ongoing seizure, intubated for airway protection.  Case discussed with intensivist (Dr. Tamala Julian)  and neuro hospitalist (Dr. Malen Gauze) at the bedside, they will be handling inpatient care. [EW]    Clinical Course User Index [EW] Daleen Bo, MD   MDM Rules/Calculators/A&P                            Patient Vitals for the past 24 hrs:  BP Temp Pulse Resp SpO2 Height Weight  03/23/21 1151 (!) 150/110 -- (!) 105 16 99 % 5\' 10"  (1.778 m) --  03/23/21 1150 (!) 182/117 -- (!) 104 17 99 % -- --  03/23/21 1145 (!) 150/110 -- 95 17 99 % -- --  03/23/21 1144 (!) 153/96 -- 97 (!) 43 100 % -- --  03/23/21 1139 (!) 184/115 98.4 F (36.9 C) 95 (!) 29 100 % -- --  03/23/21 1100 -- -- -- -- -- 5\' 10"  (1.778 m) 60 kg    11:54 AM Reevaluation with update and discussion. After initial assessment and treatment, an updated evaluation reveals per wife who is at the bedside and discussed history, he has been somewhat ill recently but she was not sure what was wrong.  He had nonspecific complaints.  I updated her on the necessity for immediate intubation to protect airway.Daleen Bo   Medical Decision Making:  This patient is presenting for evaluation of collapse and seizure, which does require a range of treatment options, and is a complaint that involves a high risk of morbidity and mortality. The differential diagnoses include  seizure, CVA, alcohol withdrawal. I decided to review old records, and in summary elderly male with nonspecific symptoms recently, presenting with collapse and seizure.  No other pertinent recent or remote history.  I obtained additional historical information from wife at bedside.  Clinical Laboratory Tests Ordered, included CBC, Metabolic panel, and PT, PTT . Review indicates normal except CO2 low, glucose high, BUN low, calcium low, AST high. Radiologic Tests Ordered, included CT head, CTA angio head and neck, chest x-ray.  I independently Visualized: Radiographic images, which show ET tube appropriately placed,  left lower lobe atelectasis  Cardiac Monitor Tracing which shows normal sinus rhythm   I discussed the case findings and results results with Dr. Malen Gauze, and Dr. Tamala Julian.   Critical Interventions-clinical evaluation, laboratory testing, CT imaging, intubation, observation and reassessment  After These Interventions, the Patient was reevaluated and was found with collapse and seizure, unclear etiology.  No clear evidence of stroke.  Possible ongoing seizure activity however he seems somewhat improved although may have a prolonged postictal state.  He requires treatment in ICU, with close management by neurologist and intensivist.  Concern for alcohol withdrawal, manifesting as presenting findings.  CRITICAL CARE- yes Performed by: Daleen Bo  Nursing Notes Reviewed/ Care Coordinated Applicable Imaging Reviewed Interpretation of Laboratory Data incorporated into ED treatment   Plan admit to intensivist, consult neurology/stroke service    Final Clinical Impression(s) / ED Diagnoses Final diagnoses:  Seizure  ([place])  Altered mental status, unspecified altered mental status type  Prolonged Q-T interval on ECG    Rx / DC Orders ED Discharge Orders     None        Daleen Bo, MD 03/23/21 1237

## 2021-03-23 NOTE — ED Notes (Signed)
Attempted to give report.  Nurse giving a pt a bath at this time.

## 2021-03-23 NOTE — Progress Notes (Signed)
EEG complete - results pending 

## 2021-03-23 NOTE — ED Notes (Signed)
Dennis Levine (son) called asking for an update at [phone]

## 2021-03-23 NOTE — ED Notes (Signed)
Attempted to give report nurse not available per staff.  Will call me back with in 10 minutes.

## 2021-03-23 NOTE — Progress Notes (Signed)
eLink Physician-Brief Progress Note Patient Name: Dennis Levine DOB: 1950/11/19 MRN: [id number]   Date of Service  03/23/2021  HPI/Events of Note  Patient admitted with new onset seizures, altered mental status, acute respiratory failure on the ventilator, past history significant for hypertension and hepatitis C, brain imaging shows multiple old infarcts, evidence of atherosclerotic vascular disease involving both the anterior and posterior circulation of the brain, and a right parietal meningioma without mass effect.  eICU Interventions  New Patient Evaluation.        Kerry Kass Duncan Alejandro 03/23/2021, 7:50 PM

## 2021-03-24 ENCOUNTER — Inpatient Hospital Stay (HOSPITAL_COMMUNITY): Payer: Medicare Other

## 2021-03-24 DIAGNOSIS — R0609 Other forms of dyspnea: Secondary | ICD-10-CM

## 2021-03-24 LAB — CBC
HCT: 37.2 % — ABNORMAL LOW (ref 39.0–52.0)
Hemoglobin: 11.9 g/dL — ABNORMAL LOW (ref 13.0–17.0)
MCH: 29 pg (ref 26.0–34.0)
MCHC: 32 g/dL (ref 30.0–36.0)
MCV: 90.7 fL (ref 80.0–100.0)
Platelets: 175 10*3/uL (ref 150–400)
RBC: 4.1 MIL/uL — ABNORMAL LOW (ref 4.22–5.81)
RDW: 14.4 % (ref 11.5–15.5)
WBC: 6.3 10*3/uL (ref 4.0–10.5)
nRBC: 0 % (ref 0.0–0.2)

## 2021-03-24 LAB — BASIC METABOLIC PANEL
Anion gap: 6 (ref 5–15)
BUN: 9 mg/dL (ref 8–23)
CO2: 25 mmol/L (ref 22–32)
Calcium: 8.2 mg/dL — ABNORMAL LOW (ref 8.9–10.3)
Chloride: 103 mmol/L (ref 98–111)
Creatinine, Ser: 0.86 mg/dL (ref 0.61–1.24)
GFR, Estimated: >60 mL/min (ref >60)
Glucose, Bld: 108 mg/dL — ABNORMAL HIGH (ref 70–99)
Potassium: 3.7 mmol/L (ref 3.5–5.1)
Sodium: 134 mmol/L — ABNORMAL LOW (ref 135–145)

## 2021-03-24 LAB — ECHOCARDIOGRAM COMPLETE
Area-P 1/2: 3.6 cm2
Height: 70 in
S' Lateral: 2.3 cm
Weight: 2373.91 oz

## 2021-03-24 LAB — GLUCOSE, CAPILLARY
Glucose-Capillary: 100 mg/dL — ABNORMAL HIGH (ref 70–99)
Glucose-Capillary: 100 mg/dL — ABNORMAL HIGH (ref 70–99)
Glucose-Capillary: 104 mg/dL — ABNORMAL HIGH (ref 70–99)
Glucose-Capillary: 116 mg/dL — ABNORMAL HIGH (ref 70–99)
Glucose-Capillary: 118 mg/dL — ABNORMAL HIGH (ref 70–99)
Glucose-Capillary: 55 mg/dL — ABNORMAL LOW (ref 70–99)
Glucose-Capillary: 69 mg/dL — ABNORMAL LOW (ref 70–99)
Glucose-Capillary: 80 mg/dL (ref 70–99)
Glucose-Capillary: 93 mg/dL (ref 70–99)
Glucose-Capillary: 95 mg/dL (ref 70–99)

## 2021-03-24 LAB — POCT I-STAT 7, (LYTES, BLD GAS, ICA,H+H)
Acid-Base Excess: 0 mmol/L (ref 0.0–2.0)
Bicarbonate: 25.7 mmol/L (ref 20.0–28.0)
Calcium, Ion: 1.25 mmol/L (ref 1.15–1.40)
HCT: 37 % — ABNORMAL LOW (ref 39.0–52.0)
Hemoglobin: 12.6 g/dL — ABNORMAL LOW (ref 13.0–17.0)
O2 Saturation: 99 %
Patient temperature: 37.8
Potassium: 3.7 mmol/L (ref 3.5–5.1)
Sodium: 137 mmol/L (ref 135–145)
TCO2: 27 mmol/L (ref 22–32)
pCO2 arterial: 46.1 mmHg (ref 32.0–48.0)
pH, Arterial: 7.357 (ref 7.350–7.450)
pO2, Arterial: 141 mmHg — ABNORMAL HIGH (ref 83.0–108.0)

## 2021-03-24 LAB — MAGNESIUM: Magnesium: 1.7 mg/dL (ref 1.7–2.4)

## 2021-03-24 LAB — PHOSPHORUS: Phosphorus: 3.7 mg/dL (ref 2.5–4.6)

## 2021-03-24 LAB — TRIGLYCERIDES: Triglycerides: 43 mg/dL (ref <150)

## 2021-03-24 MED ORDER — GLUCAGON HCL RDNA (DIAGNOSTIC) 1 MG IJ SOLR: 1.0000 mg | INTRAMUSCULAR | Status: DC

## 2021-03-24 MED ORDER — ORAL CARE MOUTH RINSE
15.0000 mL | Freq: Two times a day (BID) | OROMUCOSAL | Status: DC
  Administered 2021-03-25: 15 mL via OROMUCOSAL

## 2021-03-24 MED ORDER — ENOXAPARIN SODIUM 40 MG/0.4ML IJ SOSY
40.0000 mg | PREFILLED_SYRINGE | Freq: Every day | INTRAMUSCULAR | Status: DC
  Administered 2021-03-24 – 2021-04-14 (×22): 40 mg via SUBCUTANEOUS
  Filled 2021-03-24 (×22): qty 0.4

## 2021-03-24 MED ORDER — DEXTROSE 50 % IV SOLN
INTRAVENOUS | Status: AC
  Filled 2021-03-24: qty 50

## 2021-03-24 MED ORDER — DEXTROSE 50 % IV SOLN
12.5000 g | INTRAVENOUS | Status: AC
  Administered 2021-03-24: 04:00:00 12.5 g via INTRAVENOUS

## 2021-03-24 MED ORDER — CHLORHEXIDINE GLUCONATE 0.12 % MT SOLN
15.0000 mL | Freq: Two times a day (BID) | OROMUCOSAL | Status: DC
  Administered 2021-03-24 – 2021-03-25 (×2): 15 mL via OROMUCOSAL
  Filled 2021-03-24 (×2): qty 15

## 2021-03-24 MED ORDER — FAMOTIDINE 20 MG PO TABS
20.0000 mg | ORAL_TABLET | Freq: Two times a day (BID) | ORAL | Status: DC
Start: 2021-03-24 — End: 2021-03-24
  Administered 2021-03-24: 09:00:00 20 mg
  Filled 2021-03-24: qty 1

## 2021-03-24 MED ORDER — DEXTROSE 50 % IV SOLN
12.5000 g | INTRAVENOUS | Status: AC
  Administered 2021-03-24: 06:00:00 12.5 g via INTRAVENOUS

## 2021-03-24 MED ORDER — DEXMEDETOMIDINE HCL IN NACL 400 MCG/100ML IV SOLN
0.4000 ug/kg/h | INTRAVENOUS | Status: DC
  Administered 2021-03-24 (×2): 0.4 ug/kg/h via INTRAVENOUS
  Administered 2021-03-25 (×2): 1.2 ug/kg/h via INTRAVENOUS
  Filled 2021-03-24 (×4): qty 100

## 2021-03-24 MED ORDER — DEXTROSE-NACL 5-0.9 % IV SOLN: INTRAVENOUS | Status: AC

## 2021-03-24 MED ORDER — GADOBUTROL 1 MMOL/ML IV SOLN
6.0000 mL | Freq: Once | INTRAVENOUS | Status: AC | PRN
  Administered 2021-03-24: 01:00:00 6 mL via INTRAVENOUS

## 2021-03-24 NOTE — Progress Notes (Signed)
°  Echocardiogram 2D Echocardiogram has been performed.  Dennis Levine M 03/24/2021, 11:45 AM

## 2021-03-24 NOTE — Progress Notes (Addendum)
Neurology Progress Note   S:// Patient seen and examined. Blood sugars dropped to lowest of 55 overnight.  Corrected with replenishment.  O:// Current vital signs: BP 139/66    Pulse 76    Temp (!) 100.6 F (38.1 C) (Bladder)    Resp 16    Ht _0  (1.778 m)    Wt 67.3 kg    SpO2 100%    BMI 21.29 kg/m  Vital signs in last 24 hours: Temp:  [95.8 F (35.4 C)-100.6 F (38.1 C)] 100.6 F (38.1 C) (12/14 0730) Pulse Rate:  [65-106] 76 (12/14 0730) Resp:  [14-43] 16 (12/14 0730) BP: (79-204)/(44-140) 139/66 (12/14 0645) SpO2:  [91 %-100 %] 100 % (12/14 0700) FiO2 (%):  [30 %-100 %] 40 % (12/14 0819) Weight:  [60 kg-67.3 kg] 67.3 kg (12/14 0238) General: Intubated, sedated on propofol which was held for exam HEENT: Normocephalic/atraumatic CVS: Regular rate rhythm Abdomen nondistended nontender Neurological exam He is sedated with propofol-held for exam. Intubated Upon waiting a few minutes after discontinuing his sedation, he opened his eyes, he tracks the examiner. It almost appeared as if he was trying to understand with the examiner saying but he did not follow commands. He did move all 4 extremities spontaneously without any obvious weakness. His pupils are equal round reactive to light, corneals present, facial symmetry difficult to ascertain.   Medications  Current Facility-Administered Medications:    albuterol (PROVENTIL) (2.5 MG/3ML) 0.083% nebulizer solution 2.5 mg, 2.5 mg, Nebulization, Q2H PRN, Elie Confer, Lars Masson, NP   chlorhexidine gluconate (MEDLINE KIT) (PERIDEX) 0.12 % solution 15 mL, 15 mL, Mouth Rinse, BID, Chand, Sudham, MD, 15 mL at 03/24/21 0737   Chlorhexidine Gluconate Cloth 2 % PADS 6 each, 6 each, Topical, Daily, Jacky Kindle, MD, 6 each at 03/23/21 2033   dexmedetomidine (PRECEDEX) 400 MCG/100ML (4 mcg/mL) infusion, 0.4-1.2 mcg/kg/hr, Intravenous, Titrated, Candee Furbish, MD, Last Rate: 6.73 mL/hr at 03/24/21 0927, 0.4 mcg/kg/hr at 03/24/21 0927    dextrose 5 %-0.9 % sodium chloride infusion, , Intravenous, Continuous, Ogan, Okoronkwo U, MD, Last Rate: 50 mL/hr at 03/24/21 0800, Infusion Verify at 03/24/21 0800   docusate sodium (COLACE) capsule 100 mg, 100 mg, Oral, BID PRN, Magdalen Spatz, NP   famotidine (PEPCID) tablet 20 mg, 20 mg, Per Tube, BID, Jacky Kindle, MD, 20 mg at 03/24/21 0847   fentaNYL (SUBLIMAZE) bolus via infusion 25-100 mcg, 25-100 mcg, Intravenous, Q15 min PRN, Daleen Bo, MD, 100 mcg at 03/23/21 2006   fentaNYL 2568mg in NS 2532m(1067mml) infusion-PREMIX, 25-200 mcg/hr, Intravenous, Continuous, WenDaleen BoD, Last Rate: 10 mL/hr at 03/23/21 2210, 100 mcg/hr at 03/23/21 2210   insulin aspart (novoLOG) injection 0-15 Units, 0-15 Units, Subcutaneous, Q4H, Harris, Whitney D, NP, 2 Units at 03/23/21 1335   levETIRAcetam (KEPPRA) IVPB 500 mg/100 mL premix, 500 mg, Intravenous, Q12H, AroAmie PortlandD, Stopped at 03/24/21 0153   MEDLINE mouth rinse, 15 mL, Mouth Rinse, 10 times per day, ChaJacky KindleD, 15 mL at 03/24/21 0903   midazolam (VERSED) injection 2 mg, 2 mg, Intravenous, Q1H PRN, Ogan, Okoronkwo U, MD   ondansetron (ZOFRAN) injection 4 mg, 4 mg, Intravenous, Q6H PRN, GroElie Conferarah F, NP   polyethylene glycol (MIRALAX / GLYCOLAX) packet 17 g, 17 g, Oral, Daily PRN, GroElie Conferarah F, NP   propofol (DIPRIVAN) 1000 MG/100ML infusion, 0-50 mcg/kg/min, Intravenous, Continuous, WenDaleen BoD, Last Rate: 7.2 mL/hr at 03/24/21 0800, 20 mcg/kg/min at 03/24/21 0800 Labs CBC    Component  Value Date/Time   WBC 6.3 03/24/2021 0621   RBC 4.10 (L) 03/24/2021 0621   HGB 11.9 (L) 03/24/2021 0621   HCT 37.2 (L) 03/24/2021 0621   PLT 175 03/24/2021 0621   MCV 90.7 03/24/2021 0621   MCH 29.0 03/24/2021 0621   MCHC 32.0 03/24/2021 0621   RDW 14.4 03/24/2021 0621   LYMPHSABS 2.6 03/23/2021 1107   MONOABS 0.9 03/23/2021 1107   EOSABS 0.1 03/23/2021 1107   BASOSABS 0.0 03/23/2021 1107    CMP     Component Value  Date/Time   NA 134 (L) 03/24/2021 0621   K 3.7 03/24/2021 0621   CL 103 03/24/2021 0621   CO2 25 03/24/2021 0621   GLUCOSE 108 (H) 03/24/2021 0621   BUN 9 03/24/2021 0621   CREATININE 0.86 03/24/2021 0621   CREATININE 0.91 08/31/2015 1135   CALCIUM 8.2 (L) 03/24/2021 0621   PROT 7.0 03/23/2021 1107   ALBUMIN 3.5 03/23/2021 1107   AST 48 (H) 03/23/2021 1107   ALT 32 03/23/2021 1107   ALKPHOS 69 03/23/2021 1107   BILITOT 0.6 03/23/2021 1107   GFRNONAA >60 03/24/2021 0621   GFRNONAA 88 08/31/2015 1135   GFRAA >60 02/03/2016 1726   GFRAA >89 08/31/2015 1135     Imaging I have reviewed images in epic and the results pertinent to this consultation are: MRI completed overnight-no acute infarct, no seizure etiology identified.  There is atrophy in the right greater than left hippocampus with dilatation of the right greater than left temporal horn of the lateral ventricles.  Dural based mass along the right parietal convexity without signal abnormality in the underlying right parietal lobe consistent with a meningioma.  Overnight EEG read-epileptogenicity from right frontal region, additionally diffuse severe encephalopathy nonspecific etiology but likely related to sedation. Study was off for a large part of night due to machine not being at bedside.   Assessment:  70 year old with past history of hypertension, hepatitis C, prostate cancer presented to the emergency room after he had an episode of unresponsiveness, leftward gaze and reported seizures followed by left-sided weakness. Unclear and questionable history of alcohol abuse. Noncontrasted head CT and CT angiography of the head and neck unremarkable for LVO or evolving infarct. Emergently intubated due to inability to protect his airway. MRI done overnight negative for acute stroke No seizures noted on the spot EEG done prior to hooking him up to continuous EEG Continuous EEG with right frontal spikes and continuous generalized  slowing indicative of epileptogenicity from right frontal region but no seizures were seen throughout recording.  Impression New onset seizures of unknown etiology  Recommendations: Continue Keppra Conitnue EEG Extubate when able to Management of medical comorbidities per primary team as you are. D/W Dr Tamala Julian on the unit.  -- Amie Portland, MD Neurologist [facility] Pager: [phone]

## 2021-03-24 NOTE — Procedures (Signed)
Extubation Procedure Note  Patient Details:   Name: Dennis Levine DOB: [birth year] MRN: [id number]   Airway Documentation:    Vent end date: 03/24/21 Vent end time: 0916   Evaluation  O2 sats: stable throughout Complications: No apparent complications Patient did tolerate procedure well. Bilateral Breath Sounds: Diminished   Yes  Pt extubated to 3L [place]. No stridor noted, Cuff leak present, Vitals stable, pt tolerated well.RN at bedside, MD aware, RT will continue to monitor.   Carren Rang 03/24/2021, 9:16 AM

## 2021-03-24 NOTE — Progress Notes (Addendum)
eLink Physician-Brief Progress Note Patient Name: MASIN SHATTO DOB: 1950-09-07 MRN: [id number]   Date of Service  03/24/2021  HPI/Events of Note  Blood sugar 55 mg / dl.  eICU Interventions  D 5 % NS started at 50 ml / hour x 24 hours. Normal saline gtt discontinued.        Kerry Kass Eyden Dobie 03/24/2021, 5:28 AM

## 2021-03-24 NOTE — Progress Notes (Signed)
Initial Nutrition Assessment  DOCUMENTATION CODES:   Severe malnutrition in context of chronic illness  INTERVENTION:  -Ensure Enlive po TID, each supplement provides 350 kcal and 20 grams of protein -MVI with minerals daily -Provided education on improving nutrition status  NUTRITION DIAGNOSIS:   Severe Malnutrition related to chronic illness (h/o cancer, hep C) as evidenced by severe fat depletion, severe muscle depletion.  GOAL:   Patient will meet greater than or equal to 90% of their needs  MONITOR:   PO intake, Supplement acceptance, Labs, Weight trends, I & O's  REASON FOR ASSESSMENT:   Consult Enteral/tube feeding initiation and management  ASSESSMENT:   Pt with PMH significant for prostate cancer, hepatitis C, and HTN found down at home and was admitted with new onset seizures, diffuse intracranial atherosclerosis, and acute hypoxic respiratory failure requiring intubation.  12/13 admitted with seizures after being found down at home; intubated 12/14 MRI with no infarct/acute intracranial process, but noted dural based mass consistent with meningioma along R parietal convexity; extubated  Discussed pt during ICU rounds and with RN.   Pt was extubated yesterday, still lethargic and unable to answer RD questions. Pt's son (who lives in [place]) at bedside to assist with history. Pt's son unable to provide many details regarding specifics of pt's diet and weight history, but shares that pt enjoys cooking but often does not cook as he is living alone now. Pt's coworkers have informed the son that pt eats a lot of frozen meals now. Son also reports that pt has been complaining of "getting tiny," but could not provide more detail regarding weight and/or weight history. Will attempt to obtain more detailed diet/weight history as pt becomes more alert. Discussed a myriad of options for improving pt's nutritional status with pt's son.   Weight history reviewed. No significant weight  changes observed.   UOP: 1074m x24 hours I/O: +17232msince admit  Medications: Scheduled Meds:  chlorhexidine  15 mL Mouth Rinse BID   Chlorhexidine Gluconate Cloth  6 each Topical Daily   enoxaparin (LOVENOX) injection  40 mg Subcutaneous Daily   feeding supplement  237 mL Oral TID BM   folic acid  1 mg Oral Daily   mouth rinse  15 mL Mouth Rinse q12n4p   [START ON 03/26/2021] multivitamin with minerals  1 tablet Oral Daily   OLANZapine zydis  10 mg Sublingual Daily   PHENObarbital  130 mg Intravenous TID   Followed by   [SDerrill MemoN 03/27/2021] PHENObarbital  65 mg Intravenous TID   [START ON 04/02/2021] thiamine injection  100 mg Intravenous Daily  Continuous Infusions:  dexmedetomidine (PRECEDEX) IV infusion 1.2 mcg/kg/hr (03/25/21 1000)   levETIRAcetam Stopped (03/25/21 0048)   thiamine injection 500 mg (03/25/21 1052)   Followed by   [START ON 03/27/2021] thiamine injection     Labs: Recent Labs  Lab 03/23/21 1107 03/23/21 1110 03/23/21 1236 03/23/21 1340 03/24/21 0455 03/24/21 0621 03/25/21 0232  NA 136 138   < >  --  137 134* 136  K 4.0 3.9   < >  --  3.7 3.7 3.7  CL 104 106  --   --   --  103 106  CO2 19*  --   --   --   --  25 23  BUN 7* 6*  --   --   --  9 9  CREATININE 0.90 0.60*  --   --   --  0.86 0.73  CALCIUM 8.8*  --   --   --   --  8.2* 8.7*  MG  --   --   --  1.8  --  1.7 1.7  PHOS  --   --   --  3.3  --  3.7  --   GLUCOSE 142* 138*  --   --   --  108* 105*   < > = values in this interval not displayed.  CBGs: 85-104 x24 hours    NUTRITION - FOCUSED PHYSICAL EXAM: Flowsheet Row Most Recent Value  Orbital Region Severe depletion  Upper Arm Region Moderate depletion  Thoracic and Lumbar Region Severe depletion  Buccal Region Severe depletion  Temple Region Severe depletion  Clavicle Bone Region Severe depletion  Clavicle and Acromion Bone Region Severe depletion  Scapular Bone Region Severe depletion  Dorsal Hand Severe depletion  Patellar  Region Severe depletion  Anterior Thigh Region Severe depletion  Posterior Calf Region Severe depletion  Edema (RD Assessment) None  Hair Reviewed  Eyes Unable to assess  Mouth Unable to assess  [couldn't get good look, but appears pt missing most teeth]  Skin Reviewed  Nails Reviewed         Diet Order:   Diet Order             Diet regular Room service appropriate? Yes with Assist; Fluid consistency: Thin  Diet effective now                   EDUCATION NEEDS:   Education needs have been addressed  Skin:  Skin Assessment: Reviewed RN Assessment  Last BM:  PTA  Height:   Ht Readings from Last 1 Encounters:  03/23/21 _0  (1.778 m)    Weight:   Wt Readings from Last 10 Encounters:  03/25/21 63.4 kg  07/09/20 61.2 kg  02/03/16 60.8 kg  02/01/16 60.8 kg  08/31/15 61.7 kg    BMI:  Body mass index is 20.06 kg/m.  Estimated Nutritional Needs:   Kcal:  1900-2100  Protein:  95-105 grams  Fluid:  >1.9L     Theone Stanley., MS, RD, LDN (she/her/hers) RD pager number and weekend/on-call pager number located in [place].

## 2021-03-24 NOTE — Progress Notes (Signed)
Maint complete. 

## 2021-03-24 NOTE — Progress Notes (Addendum)
NAME:  Dennis Levine, MRN:  [id number], DOB:  27-Oct-1950, LOS: 1 ADMISSION DATE:  03/23/2021, CONSULTATION DATE:  12/13 REFERRING MD:  Eulis Foster, CHIEF COMPLAINT:  seizures, intubated   History of Present Illness:  Dennis Levine is a 70 year old male never smoker with history of prostate cancer, Hepatitis C , and hypertension found down at home by family 12/13 after they heard him collapse in the bathroom. EMS was called , the patient seized during transport to [facility] , and Code Stroke was called. Patient went straight to CT imaging, and upon arrival to ED room was found hypoxic, foaming at the mouth with tachypnea. Immediate resuscitation was started including face mask and oxygen. Patient was intubated in the ED for concern of ingoing seizures and inability to protect airway. He was started on propofol and fentanyl for sedation.    Per wife patient had not been feeling well for several days, but complaints were non-specific. PCCM consulted.   Pertinent  Medical History  Prostate cancer Hepatitis C Hypertension  Significant Hospital Events: Including procedures, antibiotic start and stop dates in addition to other pertinent events   12/13 admitted with seizures after being found down at home. Intubated 12/14 MRI with no infarct/acute intracranial process, but noted dural based mass consistent with meningioma along R parietal convexity  Interim History / Subjective:  Sedated and unable to follow commands. NAD. Remains on ventilator.   Objective   Blood pressure 105/81, pulse 72, temperature 99.5 F (37.5 C), temperature source Bladder, resp. rate 15, height 5\' 10"  (1.778 m), weight 67.3 kg, SpO2 100 %.    Vent Mode: PRVC FiO2 (%):  [30 %-100 %] 40 % Set Rate:  [16 bmp] 16 bmp Vt Set:  [580 mL] 580 mL PEEP:  [5 cmH20] 5 cmH20 Plateau Pressure:  [15 cmH20-18 cmH20] 17 cmH20   Intake/Output Summary (Last 24 hours) at 03/24/2021 0717 Last data filed at 03/24/2021 0600 Gross per 24 hour   Intake 1654.63 ml  Output 810 ml  Net 844.63 ml   Filed Weights   03/23/21 1100 03/23/21 2000 03/24/21 0238  Weight: 60 kg 63.7 kg 67.3 kg    Examination: General: Thin appearing elderly male on vent. NAD HENT: [place], AT. ETT Lungs: CTAB, on vent Cardiovascular: RRR, no murmur Abdomen: Abd soft, nontender, +BS Extremities: No LE edema Neuro: Sedated, unable to follow commands GU: Foley in place  Resolved Hospital Problem list     Assessment & Plan:  New onset seizures Diffuse intracranial atherosclerosis MRI brain with no acute infarct or intracranial process- no seizure etiology identified. Dural based mass along R parietal convexity noted, consistent with meningioma.  - Neuro consulted - Seizure precautions - AEDs per neuro  - EEG  Acute hypoxic respiratory failure requiring intubation Intubated on 12/14 for failure to protect airway.  - SBTs as tolerated  - Wean propofol/fentanyl as able  Hypertension SBP ranging from 100-140.  - Consider Cleviprex if BP elevated - PRN hydralazine - Telemetry  Best Practice (right click and "Reselect all SmartList Selections" daily)   Diet/type: NPO DVT prophylaxis: SCD GI prophylaxis: N/A Lines: N/A Foley:  N/A Code Status:  full code Last date of multidisciplinary goals of care discussion [pending]  Labs   CBC: Recent Labs  Lab 03/23/21 1107 03/23/21 1110 03/23/21 1236 03/24/21 0455  WBC 7.0  --   --   --   NEUTROABS 3.3  --   --   --   HGB 14.5 16.7 13.9  12.6*  HCT 47.5 49.0 41.0 37.0*  MCV 93.5  --   --   --   PLT 210  --   --   --     Basic Metabolic Panel: Recent Labs  Lab 03/23/21 1107 03/23/21 1110 03/23/21 1236 03/23/21 1340 03/24/21 0455  NA 136 138 138  --  137  K 4.0 3.9 3.9  --  3.7  CL 104 106  --   --   --   CO2 19*  --   --   --   --   GLUCOSE 142* 138*  --   --   --   BUN 7* 6*  --   --   --   CREATININE 0.90 0.60*  --   --   --   CALCIUM 8.8*  --   --   --   --   MG  --   --   --   1.8  --   PHOS  --   --   --  3.3  --    GFR: Estimated Creatinine Clearance: 80.6 mL/min (A) (by C-G formula based on SCr of 0.6 mg/dL (L)). Recent Labs  Lab 03/23/21 1107 03/23/21 1320 03/23/21 1652  WBC 7.0  --   --   LATICACIDVEN  --  2.0* 1.3    Liver Function Tests: Recent Labs  Lab 03/23/21 1107  AST 48*  ALT 32  ALKPHOS 69  BILITOT 0.6  PROT 7.0  ALBUMIN 3.5   No results for input(s): LIPASE, AMYLASE in the last 168 hours. No results for input(s): AMMONIA in the last 168 hours.  ABG    Component Value Date/Time   PHART 7.357 03/24/2021 0455   PCO2ART 46.1 03/24/2021 0455   PO2ART 141 (H) 03/24/2021 0455   HCO3 25.7 03/24/2021 0455   TCO2 27 03/24/2021 0455   O2SAT 99.0 03/24/2021 0455     Coagulation Profile: Recent Labs  Lab 03/23/21 1107  INR 1.0    Cardiac Enzymes: Recent Labs  Lab 03/23/21 1340  CKTOTAL 166    HbA1C: Hgb A1c MFr Bld  Date/Time Value Ref Range Status  03/23/2021 01:40 PM 6.2 (H) 4.8 - 5.6 % Final    Comment:    (NOTE) Pre diabetes:          5.7%-6.4%  Diabetes:              >6.4%  Glycemic control for   <7.0% adults with diabetes     CBG: Recent Labs  Lab 03/24/21 0123 03/24/21 0323 03/24/21 0350 03/24/21 0516 03/24/21 0539  GLUCAP 80 55* 100* 69* 116*    Review of Systems:     Critical care time: 30 minutes

## 2021-03-24 NOTE — Progress Notes (Signed)
[place] Progress Note Patient Name: LESTON SCHUELLER DOB: Oct 13, 1950 MRN: [id number]   Date of Service  03/24/2021  HPI/Events of Note  Foley catheter was placed in the ED and patient needs an order to continue it. Patient needs a KUB to verify placement of NG tube.  eICU Interventions  Orders entered.        Kerry Kass Shameer Molstad 03/24/2021, 12:44 AM

## 2021-03-24 NOTE — Procedures (Addendum)
Patient Name: Dennis Levine  MRN: [id number]  Epilepsy Attending: Lora Havens  Referring Physician/Provider: Dr Amie [place] Duration: 03/23/2021 1325 to [place] 03/24/2021 0810 to 03/25/2021 0300   Patient history:  70 year old being brought in for evaluation for leftward gaze, seizures followed by left-sided weakness. EEG to evaluate for seizure   Level of alertness: lethargic   AEDs during EEG study: LEV, ativan, propofol   Technical aspects: This EEG study was done with scalp electrodes positioned according to the 10-20 International system of electrode placement. Electrical activity was acquired at a sampling rate of 500Hz  and reviewed with a high frequency filter of 70Hz  and a low frequency filter of 1Hz . EEG data were recorded continuously and digitally stored.    Description: EEG showed continuous generalized low amplitude 3-6 theta-delta slowing admixed with 15 to 18 Hz generalized beta activity predominantly in bilateral frontocentral regions. Frequent spikes were noted in right frontal region. Hyperventilation and photic stimulation were not performed.     Of note, study was technically difficult due to significant myogenic artifact.  Additionally, machine was unplugged at [place] on 04/09/2024 during patient transferred and not replugged until 0810 on 03/24/2021.    ABNORMALITY -Spike, right frontal region -Continuous slow, generalized   IMPRESSION: This study showed evidence of epileptogenicity arising from right frontal region. Additionally there is severe diffuse encephalopathy, nonspecific etiology but most likely due to sedation. No seizures were seen throughout the recording.   Kynli Chou [name]

## 2021-03-24 NOTE — Progress Notes (Signed)
Inpatient Diabetes Program Recommendations  AACE/ADA: New Consensus Statement on Inpatient Glycemic Control (2015)  Target Ranges:  Prepandial:   less than 140 mg/dL      Peak postprandial:   less than 180 mg/dL (1-2 hours)      Critically ill patients:  140 - 180 mg/dL   Lab Results  Component Value Date   GLUCAP 93 03/24/2021   HGBA1C 6.2 (H) 03/23/2021    Review of Glycemic Control  Latest Reference Range & Units 03/23/21 11:04 03/23/21 16:47 03/23/21 19:19 03/24/21 01:23 03/24/21 03:23 03/24/21 03:50 03/24/21 05:16 03/24/21 05:39  Glucose-Capillary 70 - 99 mg/dL 127 (H) Novolog 2 units 90 88 80 55 (L) 100 (H) 69 (L) 116 (H)  (H): Data is abnormally high (L): Data is abnormally low Inpatient Diabetes Program Recommendations:   Please consider: -Decrease Novolog correction to 0-6 units q 4 hrs.  Thank you, Nani Gasser. Evangelyne Loja, RN, MSN, CDE  Diabetes Coordinator Inpatient Glycemic Control Team Team Pager [phone] (8am-5pm) 03/24/2021 9:29 AM

## 2021-03-24 NOTE — Progress Notes (Signed)
RT went with pt to MRI. No complications and vitals are stable. Rt will continue to monitor as needed.

## 2021-03-25 LAB — CBC WITH DIFFERENTIAL/PLATELET
Abs Immature Granulocytes: 0.02 10*3/uL (ref 0.00–0.07)
Basophils Absolute: 0 10*3/uL (ref 0.0–0.1)
Basophils Relative: 0 %
Eosinophils Absolute: 0 10*3/uL (ref 0.0–0.5)
Eosinophils Relative: 1 %
HCT: 39.7 % (ref 39.0–52.0)
Hemoglobin: 13 g/dL (ref 13.0–17.0)
Immature Granulocytes: 0 %
Lymphocytes Relative: 16 %
Lymphs Abs: 1.3 10*3/uL (ref 0.7–4.0)
MCH: 28.8 pg (ref 26.0–34.0)
MCHC: 32.7 g/dL (ref 30.0–36.0)
MCV: 87.8 fL (ref 80.0–100.0)
Monocytes Absolute: 0.9 10*3/uL (ref 0.1–1.0)
Monocytes Relative: 11 %
Neutro Abs: 5.9 10*3/uL (ref 1.7–7.7)
Neutrophils Relative %: 72 %
Platelets: 176 10*3/uL (ref 150–400)
RBC: 4.52 MIL/uL (ref 4.22–5.81)
RDW: 13.7 % (ref 11.5–15.5)
WBC: 8.2 10*3/uL (ref 4.0–10.5)
nRBC: 0 % (ref 0.0–0.2)

## 2021-03-25 LAB — GLUCOSE, CAPILLARY
Glucose-Capillary: 103 mg/dL — ABNORMAL HIGH (ref 70–99)
Glucose-Capillary: 117 mg/dL — ABNORMAL HIGH (ref 70–99)
Glucose-Capillary: 223 mg/dL — ABNORMAL HIGH (ref 70–99)
Glucose-Capillary: 50 mg/dL — ABNORMAL LOW (ref 70–99)
Glucose-Capillary: 72 mg/dL (ref 70–99)
Glucose-Capillary: 80 mg/dL (ref 70–99)
Glucose-Capillary: 85 mg/dL (ref 70–99)
Glucose-Capillary: 86 mg/dL (ref 70–99)

## 2021-03-25 LAB — COMPREHENSIVE METABOLIC PANEL
ALT: 36 U/L (ref 0–44)
AST: 65 U/L — ABNORMAL HIGH (ref 15–41)
Albumin: 2.7 g/dL — ABNORMAL LOW (ref 3.5–5.0)
Alkaline Phosphatase: 54 U/L (ref 38–126)
Anion gap: 7 (ref 5–15)
BUN: 9 mg/dL (ref 8–23)
CO2: 23 mmol/L (ref 22–32)
Calcium: 8.7 mg/dL — ABNORMAL LOW (ref 8.9–10.3)
Chloride: 106 mmol/L (ref 98–111)
Creatinine, Ser: 0.73 mg/dL (ref 0.61–1.24)
GFR, Estimated: >60 mL/min (ref >60)
Glucose, Bld: 105 mg/dL — ABNORMAL HIGH (ref 70–99)
Potassium: 3.7 mmol/L (ref 3.5–5.1)
Sodium: 136 mmol/L (ref 135–145)
Total Bilirubin: 1.3 mg/dL — ABNORMAL HIGH (ref 0.3–1.2)
Total Protein: 6 g/dL — ABNORMAL LOW (ref 6.5–8.1)

## 2021-03-25 LAB — MAGNESIUM: Magnesium: 1.7 mg/dL (ref 1.7–2.4)

## 2021-03-25 MED ORDER — HYDRALAZINE HCL 20 MG/ML IJ SOLN
10.0000 mg | INTRAMUSCULAR | Status: DC | PRN
  Administered 2021-03-25: 10 mg via INTRAVENOUS
  Administered 2021-03-25: 20 mg via INTRAVENOUS
  Administered 2021-03-25 – 2021-03-26 (×3): 10 mg via INTRAVENOUS
  Administered 2021-03-26: 20 mg via INTRAVENOUS
  Administered 2021-03-26: 10 mg via INTRAVENOUS
  Administered 2021-03-27: 20 mg via INTRAVENOUS
  Filled 2021-03-25 (×8): qty 1

## 2021-03-25 MED ORDER — DEXTROSE 50 % IV SOLN: 25.0000 g | INTRAVENOUS | Status: AC

## 2021-03-25 MED ORDER — POTASSIUM CHLORIDE 10 MEQ/100ML IV SOLN
10.0000 meq | INTRAVENOUS | Status: AC
  Administered 2021-03-25 (×4): 10 meq via INTRAVENOUS
  Filled 2021-03-25 (×4): qty 100

## 2021-03-25 MED ORDER — FOLIC ACID 1 MG PO TABS
1.0000 mg | ORAL_TABLET | Freq: Every day | ORAL | Status: DC
  Administered 2021-03-26 – 2021-04-14 (×20): 1 mg via ORAL
  Filled 2021-03-25 (×20): qty 1

## 2021-03-25 MED ORDER — THIAMINE HCL 100 MG/ML IJ SOLN: 100.0000 mg | Freq: Every day | INTRAMUSCULAR | Status: DC

## 2021-03-25 MED ORDER — DEXTROSE 50 % IV SOLN
INTRAVENOUS | Status: AC
  Administered 2021-03-25: 50 mL
  Filled 2021-03-25: qty 50

## 2021-03-25 MED ORDER — ADULT MULTIVITAMIN W/MINERALS CH
1.0000 | ORAL_TABLET | Freq: Every day | ORAL | Status: DC
  Administered 2021-03-26 – 2021-04-14 (×20): 1 via ORAL
  Filled 2021-03-25 (×20): qty 1

## 2021-03-25 MED ORDER — OLANZAPINE 5 MG PO TBDP
10.0000 mg | ORAL_TABLET | Freq: Every day | ORAL | Status: DC
  Filled 2021-03-25: qty 2

## 2021-03-25 MED ORDER — LORAZEPAM 2 MG/ML IJ SOLN
INTRAMUSCULAR | Status: AC
  Administered 2021-03-25: 2 mg via INTRAVENOUS
  Filled 2021-03-25: qty 1

## 2021-03-25 MED ORDER — PHENOBARBITAL SODIUM 65 MG/ML IJ SOLN
65.0000 mg | Freq: Three times a day (TID) | INTRAMUSCULAR | Status: AC
  Administered 2021-03-27 – 2021-03-28 (×6): 65 mg via INTRAVENOUS
  Filled 2021-03-25 (×6): qty 1

## 2021-03-25 MED ORDER — LORAZEPAM 1 MG PO TABS
1.0000 mg | ORAL_TABLET | ORAL | Status: AC | PRN
  Administered 2021-03-26 – 2021-03-27 (×4): 2 mg via ORAL
  Administered 2021-03-27: 1 mg via ORAL
  Filled 2021-03-25: qty 2
  Filled 2021-03-25: qty 1
  Filled 2021-03-25: qty 2
  Filled 2021-03-25 (×2): qty 1
  Filled 2021-03-25 (×2): qty 2

## 2021-03-25 MED ORDER — PHENOBARBITAL SODIUM 130 MG/ML IJ SOLN
130.0000 mg | Freq: Three times a day (TID) | INTRAMUSCULAR | Status: AC
  Administered 2021-03-25 – 2021-03-26 (×5): 130 mg via INTRAVENOUS
  Filled 2021-03-25 (×5): qty 1

## 2021-03-25 MED ORDER — MAGNESIUM SULFATE 2 GM/50ML IV SOLN
2.0000 g | Freq: Once | INTRAVENOUS | Status: AC
  Administered 2021-03-25: 2 g via INTRAVENOUS
  Filled 2021-03-25: qty 50

## 2021-03-25 MED ORDER — THIAMINE HCL 100 MG/ML IJ SOLN
500.0000 mg | Freq: Three times a day (TID) | INTRAVENOUS | Status: AC
  Administered 2021-03-25 – 2021-03-27 (×6): 500 mg via INTRAVENOUS
  Filled 2021-03-25 (×8): qty 5

## 2021-03-25 MED ORDER — THIAMINE HCL 100 MG/ML IJ SOLN
250.0000 mg | Freq: Every day | INTRAVENOUS | Status: DC
  Administered 2021-03-27 – 2021-03-31 (×5): 250 mg via INTRAVENOUS
  Filled 2021-03-25 (×5): qty 2.5

## 2021-03-25 MED ORDER — ENSURE ENLIVE PO LIQD
237.0000 mL | Freq: Three times a day (TID) | ORAL | Status: DC
  Administered 2021-03-25 – 2021-04-14 (×52): 237 mL via ORAL
  Filled 2021-03-25: qty 237

## 2021-03-25 MED ORDER — OLANZAPINE 5 MG PO TBDP
10.0000 mg | ORAL_TABLET | Freq: Every day | ORAL | Status: DC
  Administered 2021-03-25 – 2021-03-27 (×3): 10 mg via SUBLINGUAL
  Filled 2021-03-25 (×5): qty 2

## 2021-03-25 MED ORDER — LORAZEPAM 2 MG/ML IJ SOLN
1.0000 mg | INTRAMUSCULAR | Status: AC | PRN
  Administered 2021-03-26: 1 mg via INTRAVENOUS
  Administered 2021-03-27: 2 mg via INTRAVENOUS
  Filled 2021-03-25 (×2): qty 1

## 2021-03-25 NOTE — Discharge Instructions (Addendum)
Follow with Primary MD /SNF physician  Get CBC, CMP, 2 view Chest X ray checked  by Primary MD next visit.    Activity: As tolerated with Full fall precautions use walker/cane & assistance as needed   Disposition Home    Diet: Dysphagia 3 with thin liquid.   On your next visit with your primary care physician please Get Medicines reviewed and adjusted.   Please request your Prim.MD to go over all Hospital Tests and Procedure/Radiological results at the follow up, please get all Hospital records sent to your Prim MD by signing hospital release before you go home.   If you experience worsening of your admission symptoms, develop shortness of breath, life threatening emergency, suicidal or homicidal thoughts you must seek medical attention immediately by calling 911 or calling your MD immediately  if symptoms less severe.  You Must read complete instructions/literature along with all the possible adverse reactions/side effects for all the Medicines you take and that have been prescribed to you. Take any new Medicines after you have completely understood and accpet all the possible adverse reactions/side effects.   Do not drive, operating heavy machinery, perform activities at heights, swimming or participation in water activities or provide baby sitting services if your were admitted for syncope or siezures until you have seen by Primary MD or a Neurologist and advised to do so again.  Do not drive when taking Pain medications.    Do not take more than prescribed Pain, Sleep and Anxiety Medications  Special Instructions: If you have smoked or chewed Tobacco  in the last 2 yrs please stop smoking, stop any regular Alcohol  and or any Recreational drug use.  Wear Seat belts while driving.   Please note  You were cared for by a hospitalist during your hospital stay. If you have any questions about your discharge medications or the care you received while you were in the hospital after  you are discharged, you can call the unit and asked to speak with the hospitalist on call if the hospitalist that took care of you is not available. Once you are discharged, your primary care physician will handle any further medical issues. Please note that NO REFILLS for any discharge medications will be authorized once you are discharged, as it is imperative that you return to your primary care physician (or establish a relationship with a primary care physician if you do not have one) for your aftercare needs so that they can reassess your need for medications and monitor your lab values.

## 2021-03-25 NOTE — Progress Notes (Signed)
[facility] ADULT ICU REPLACEMENT PROTOCOL   The patient does apply for the [facility] Adult ICU Electrolyte Replacment Protocol based on the criteria listed below:   1.Exclusion criteria: TCTS patients, ECMO patients, and Dialysis patients 2. Is GFR >/= 30 ml/min? Yes.    Patient's GFR today is >60 3. Is SCr </= 2? Yes.   Patient's SCr is 0.73 mg/dL 4. Did SCr increase >/= 0.5 in 24 hours? No. 5.Pt's weight >40kg  Yes.   6. Abnormal electrolyte(s): K+ 3.7, mag 1.7  7. Electrolytes replaced per protocol 8.  Call MD STAT for K+ </= 2.5, Phos </= 1, or Mag </= 1 Physician:  n/a  Dennis Levine 03/25/2021 4:13 AM

## 2021-03-25 NOTE — Progress Notes (Signed)
OT Cancellation Note  Patient Details Name: CHARELS STAMBAUGH MRN: [id number] DOB: 1951-01-25   Cancelled Treatment:    Reason Eval/Treat Not Completed: Patient not medically ready. (pt with continuous EEG, restless, agitated, and being actively restrained). Plan to reattempt.  Tyrone Schimke, OT Acute Rehabilitation Services Pager: [phone] Office: [phone]  03/25/2021, 10:00 AM

## 2021-03-25 NOTE — Progress Notes (Signed)
LTM EEG discontinued - no skin breakdown at unhook.   

## 2021-03-25 NOTE — Evaluation (Signed)
Clinical/Bedside Swallow Evaluation Patient Details  Name: Dennis Levine MRN: [id number] Date of Birth: 1950-09-17  Today's Date: 03/25/2021 Time: SLP Start Time (ACUTE ONLY): 0848 SLP Stop Time (ACUTE ONLY): 0903 SLP Time Calculation (min) (ACUTE ONLY): 15 min  Past Medical History:  Past Medical History:  Diagnosis Date   Cancer ([place])    prostate   Hepatitis    hepatitis c    Hypertension    Past Surgical History:  Past Surgical History:  Procedure Laterality Date   4th index finger straightened  1990's   finger surgery for injury  1970's right hand   PELVIC LYMPH NODE DISSECTION N/A 02/03/2016   Procedure: PELVIC LYMPH NODE DISSECTION;  Surgeon: Ardis Hughs, MD;  Location: [facility] ORS;  Service: Urology;  Laterality: N/A;   ROBOT ASSISTED LAPAROSCOPIC RADICAL PROSTATECTOMY N/A 02/03/2016   Procedure: XI ROBOTIC ASSISTED LAPAROSCOPIC RADICAL PROSTATECTOMY;  Surgeon: Ardis Hughs, MD;  Location: [facility] ORS;  Service: Urology;  Laterality: N/A;   HPI:  Dennis Levine. Dennis admitted with seizures and intubated. ETT 12/13-12/14. MRI brain negative. 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 PMH: history of prostate cancer, Hepatitis C , and hypertension.    Assessment / Plan / Recommendation  Clinical Impression  Dennis was seen for bedside swallow evaluation and he denied a history of dysphagia. He stated that he wears dentures, but does not use them during meals since they "just slush about my mouth when I'm trying to chew". His reliability as a historian is questioned due to his current mentation and the variability of some of his responses. Oral mechanism exam was limited due to Dennis's difficulty following commands; however, oral motor strength and ROM appeared grossly WFL. Dennis presented with four teeth; two maxillary and two mandibular, both of which were anterior. He  tolerated all solids and liquids without signs or symptoms of oropharyngeal dysphagia. Mastication was functional despite reduced dentition. A regular texture diet with thin liquids is recommended at this time and SLP will follow briefly to ensure tolerance. SLP Visit Diagnosis: Dysphagia, unspecified (R13.10)    Aspiration Risk  Mild aspiration risk    Diet Recommendation Regular;Thin liquid   Liquid Administration via: Cup;Straw Medication Administration: Whole meds with liquid Supervision: Patient able to self feed Compensations: Slow rate Postural Changes: Seated upright at 90 degrees    Other  Recommendations Oral Care Recommendations: Oral care BID    Recommendations for follow up therapy are one component of a multi-disciplinary discharge planning process, led by the attending physician.  Recommendations may be updated based on patient status, additional functional criteria and insurance authorization.  Follow up Recommendations No SLP follow up      Assistance Recommended at Discharge None  Functional Status Assessment    Frequency and Duration min 1 x/week  1 week       Prognosis Prognosis for Safe Diet Advancement: Good Barriers to Reach Goals: Cognitive deficits      Swallow Study   General Date of Onset: 03/24/21 HPI: Dennis Levine. Dennis admitted with seizures and intubated. ETT 12/13-12/14. MRI brain negative. 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 PMH: history of prostate cancer, Hepatitis C , and hypertension. Type of Study: Bedside Swallow Evaluation Previous Swallow  Assessment: none Diet Prior to this Study: NPO Temperature Spikes Noted: No Respiratory Status: Room air History of Recent Intubation: Yes Length of Intubations (days): 1 days Date extubated: 03/24/21 Behavior/Cognition: Alert;Cooperative;Pleasant mood Oral Cavity  Assessment: Within Functional Limits Oral Care Completed by SLP: No Oral Cavity - Dentition: Adequate natural dentition Vision: Functional for self-feeding Self-Feeding Abilities: Able to feed self Patient Positioning: Upright in bed;Postural control adequate for testing Baseline Vocal Quality: Low vocal intensity Volitional Cough: Strong Volitional Swallow: Able to elicit    Oral/Motor/Sensory Function Overall Oral Motor/Sensory Function: Within functional limits   Ice Chips Ice chips: Within functional limits Presentation: Spoon   Thin Liquid Thin Liquid: Within functional limits Presentation: Straw    Nectar Thick Nectar Thick Liquid: Not tested   Honey Thick Honey Thick Liquid: Not tested   Puree Puree: Within functional limits Presentation: Spoon   Solid     Solid: Within functional limits     [place] I. Hardin Negus, [place], [place] Office number [phone] Pager [place] 03/25/2021,9:08 AM

## 2021-03-25 NOTE — Progress Notes (Signed)
PT Cancellation Note  Patient Details Name: Dennis Levine MRN: [id number] DOB: [birth year]   Cancelled Treatment:    Reason Eval/Treat Not Completed: Other (comment) (pt with continuous EEG, restless, agitated, and being actively restrained)   Khyron Garno B Kassy Mcenroe 03/25/2021, 9:58 AM [place] Pager: [phone] Office: [phone]

## 2021-03-25 NOTE — Progress Notes (Signed)
NAME:  Dennis Levine, MRN:  [id number], DOB:  09-08-1950, LOS: 2 ADMISSION DATE:  03/23/2021, CONSULTATION DATE:  12/13 REFERRING MD:  Eulis Foster, CHIEF COMPLAINT:  seizures, intubated   History of Present Illness:  Dennis Levine is a 70 year old male never smoker with history of prostate cancer, Hepatitis C , and hypertension found down at home by family 12/13 after they heard him collapse in the bathroom. EMS was called , the patient seized during transport to [facility] , and Code Stroke was called. Patient went straight to CT imaging, and upon arrival to ED room was found hypoxic, foaming at the mouth with tachypnea. Immediate resuscitation was started including face mask and oxygen. Patient was intubated in the ED for concern of ingoing seizures and inability to protect airway. He was started on propofol and fentanyl for sedation.    Per wife patient had not been feeling well for several days, but complaints were non-specific. PCCM consulted.   Pertinent  Medical History  Prostate cancer Hepatitis C Hypertension  Significant Hospital Events: Including procedures, antibiotic start and stop dates in addition to other pertinent events   12/13 admitted with seizures after being found down at home. Intubated 12/14 MRI with no infarct/acute intracranial process, but noted dural based mass consistent with meningioma along R parietal convexity 12/14 Extubated   Interim History / Subjective:  Awake, off ventilator. His mental status waxes and wanes from a&o to self, to a&o x3. Very anxious appearing.   Objective   Blood pressure (!) 185/96, pulse 60, temperature (!) 95.9 F (35.5 C), temperature source Bladder, resp. rate (!) 28, height 5\' 10"  (1.778 m), weight 63.4 kg, SpO2 95 %.    Vent Mode: PRVC FiO2 (%):  [40 %] 40 % Set Rate:  [16 bmp] 16 bmp Vt Set:  [580 mL] 580 mL PEEP:  [5 cmH20] 5 cmH20 Plateau Pressure:  [17 cmH20] 17 cmH20   Intake/Output Summary (Last 24 hours) at 03/25/2021  0648 Last data filed at 03/25/2021 0500 Gross per 24 hour  Intake 1402.65 ml  Output 265 ml  Net 1137.65 ml   Filed Weights   03/23/21 2000 03/24/21 0238 03/25/21 0500  Weight: 63.7 kg 67.3 kg 63.4 kg    Examination: General: Thin, elderly appearing male. NAD. Anxious HENT: [place], AT. EOMI Lungs: CTAB. Nml resp effort on room air  Cardiovascular: Slightly bradycardic rate, regular rhythm. no murmur Abdomen: Soft, nontender, +BS Extremities: No LE edema Neuro: Awake, alert. Able to follow commands. Mental status waxing and waning GU: Foley  Resolved Hospital Problem list     Assessment & Plan:  New onset seizures Diffuse intracranial atherosclerosis MRI brain with no acute infarct or intracranial process- no seizure etiology identified. Dural based mass along R parietal convexity noted, consistent with meningioma. EEG indicative of epileptogenicity from R frontal region, but no active seizures noted during recording.  - Neuro consulted - Seizure precautions - Continue Keppra - PT/OT eval and treat  Delirium vs post ictal state Patient mental status waxes and wanes, confused at most times. - Check QT  - Consider haldol 5 q6h prn vs benzos if QT prolonged  Acute hypoxic respiratory failure requiring intubation Intubated on 12/13 for failure to protect airway, extubated on 12/14.  SpO2 95% on room air.   Hypertension SBP ranging from 145-180. - Consider Cleviprex if BP remains elevated - PRN hydralazine - Telemetry  Electrolyte abnormalities K 3.7, Mg 1.7.  - Kcl 10 mEq x 4 doses, Mg  sulfate 2g   Best Practice (right click and "Reselect all SmartList Selections" daily)   Diet/type: NPO pending SLP eval DVT prophylaxis: Lovenox GI prophylaxis: N/A Lines: N/A Foley:  N/A Code Status:  full code Last date of multidisciplinary goals of care discussion [pending]  Labs   CBC: Recent Labs  Lab 03/23/21 1107 03/23/21 1110 03/23/21 1236 03/24/21 0455  03/24/21 0621 03/25/21 0232  WBC 7.0  --   --   --  6.3 8.2  NEUTROABS 3.3  --   --   --   --  5.9  HGB 14.5 16.7 13.9 12.6* 11.9* 13.0  HCT 47.5 49.0 41.0 37.0* 37.2* 39.7  MCV 93.5  --   --   --  90.7 87.8  PLT 210  --   --   --  175 973    Basic Metabolic Panel: Recent Labs  Lab 03/23/21 1107 03/23/21 1110 03/23/21 1236 03/23/21 1340 03/24/21 0455 03/24/21 0621 03/25/21 0232  NA 136 138 138  --  137 134* 136  K 4.0 3.9 3.9  --  3.7 3.7 3.7  CL 104 106  --   --   --  103 106  CO2 19*  --   --   --   --  25 23  GLUCOSE 142* 138*  --   --   --  108* 105*  BUN 7* 6*  --   --   --  9 9  CREATININE 0.90 0.60*  --   --   --  0.86 0.73  CALCIUM 8.8*  --   --   --   --  8.2* 8.7*  MG  --   --   --  1.8  --  1.7 1.7  PHOS  --   --   --  3.3  --  3.7  --    GFR: Estimated Creatinine Clearance: 75.9 mL/min (by C-G formula based on SCr of 0.73 mg/dL). Recent Labs  Lab 03/23/21 1107 03/23/21 1320 03/23/21 1652 03/24/21 0621 03/25/21 0232  WBC 7.0  --   --  6.3 8.2  LATICACIDVEN  --  2.0* 1.3  --   --     Liver Function Tests: Recent Labs  Lab 03/23/21 1107 03/25/21 0232  AST 48* 65*  ALT 32 36  ALKPHOS 69 54  BILITOT 0.6 1.3*  PROT 7.0 6.0*  ALBUMIN 3.5 2.7*   No results for input(s): LIPASE, AMYLASE in the last 168 hours. No results for input(s): AMMONIA in the last 168 hours.  ABG    Component Value Date/Time   PHART 7.357 03/24/2021 0455   PCO2ART 46.1 03/24/2021 0455   PO2ART 141 (H) 03/24/2021 0455   HCO3 25.7 03/24/2021 0455   TCO2 27 03/24/2021 0455   O2SAT 99.0 03/24/2021 0455     Coagulation Profile: Recent Labs  Lab 03/23/21 1107  INR 1.0    Cardiac Enzymes: Recent Labs  Lab 03/23/21 1340  CKTOTAL 166    HbA1C: Hgb A1c MFr Bld  Date/Time Value Ref Range Status  03/23/2021 01:40 PM 6.2 (H) 4.8 - 5.6 % Final    Comment:    (NOTE) Pre diabetes:          5.7%-6.4%  Diabetes:              >6.4%  Glycemic control for    <7.0% adults with diabetes     CBG: Recent Labs  Lab 03/24/21 1147 03/24/21 1608 03/24/21 1935 03/24/21 2335 03/25/21 0322  GLUCAP 118* 100* 95 104* 103*    Review of Systems:     Critical care time: 30 minutes

## 2021-03-25 NOTE — Progress Notes (Signed)
[facility] Progress Note Patient Name: Dennis Levine DOB: 1950/08/26 MRN: [id number]   Date of Service  03/25/2021  HPI/Events of Note  BP 180/102, HR 50's to 60's.  eICU Interventions  PRN iv Hydralazine ordered.        Elly Haffey U Patsey Pitstick 03/25/2021, 1:19 AM

## 2021-03-25 NOTE — Progress Notes (Signed)
Extubated yesterday Neurology Progress Note   S:// Patient seen and examined.  Extubated yesterday. Tells me he drinks a sixpack of beer every day.   O:// Current vital signs: BP (!) 176/95    Pulse (!) 57    Temp (!) 97.5 F (36.4 C) (Axillary)    Resp (!) 24    Ht 5\' 10"  (1.778 m)    Wt 63.4 kg    SpO2 98%    BMI 20.06 kg/m  Vital signs in last 24 hours: Temp:  [95.5 F (35.3 C)-99 F (37.2 C)] 97.5 F (36.4 C) (12/15 0800) Pulse Rate:  [49-79] 57 (12/15 0700) Resp:  [10-36] 24 (12/15 0700) BP: (125-193)/(73-134) 176/95 (12/15 0939) SpO2:  [94 %-100 %] 98 % (12/15 0700) Weight:  [63.4 kg] 63.4 kg (12/15 0500) General: Sleeping in bed, opens eyes to voice. HEENT: Normocephalic/atraumatic CVS: Regular rhythm Respiratory: Breathing well saturating normally on room air Abdomen nondistended nontender Extremities warm well perfused Neurological exam Awake alert oriented to self and the fact that he is in a hospital Told me the month is December Could not tell me the name of the hospital Mildly dysarthric speech-question if that is because of his edentulousness No evidence of aphasia Poor attention concentration Cranial nerves II to XII intact Motor examination with no drift in any of the 4 extremities Sensation intact light touch No dysmetria   Medications  Current Facility-Administered Medications:    albuterol (PROVENTIL) (2.5 MG/3ML) 0.083% nebulizer solution 2.5 mg, 2.5 mg, Nebulization, Q2H PRN, Elie Confer, Sarah F, NP   chlorhexidine (PERIDEX) 0.12 % solution 15 mL, 15 mL, Mouth Rinse, BID, Candee Furbish, MD, 15 mL at 03/25/21 0919   Chlorhexidine Gluconate Cloth 2 % PADS 6 each, 6 each, Topical, Daily, Chand, Sudham, MD, 6 each at 03/25/21 0423   dexmedetomidine (PRECEDEX) 400 MCG/100ML (4 mcg/mL) infusion, 0.4-1.2 mcg/kg/hr, Intravenous, Titrated, Candee Furbish, MD, Last Rate: 20.2 mL/hr at 03/25/21 0800, 1.2 mcg/kg/hr at 03/25/21 0800   docusate sodium (COLACE)  capsule 100 mg, 100 mg, Oral, BID PRN, Magdalen Spatz, NP   enoxaparin (LOVENOX) injection 40 mg, 40 mg, Subcutaneous, Daily, Candee Furbish, MD, 40 mg at 03/25/21 0919   hydrALAZINE (APRESOLINE) injection 10-20 mg, 10-20 mg, Intravenous, Q4H PRN, Ogan, [place]wo U, MD, 20 mg at 03/25/21 0939   levETIRAcetam (KEPPRA) IVPB 500 mg/100 mL premix, 500 mg, Intravenous, Q12H, Amie Portland, MD, Stopped at 03/25/21 0048   MEDLINE mouth rinse, 15 mL, Mouth Rinse, q12n4p, Candee Furbish, MD   midazolam (VERSED) injection 2 mg, 2 mg, Intravenous, Q1H PRN, Lucile Shutters, Kerry Kass, MD   OLANZapine zydis (ZYPREXA) disintegrating tablet 10 mg, 10 mg, Sublingual, Daily, Candee Furbish, MD, 10 mg at 03/25/21 0926   ondansetron (ZOFRAN) injection 4 mg, 4 mg, Intravenous, Q6H PRN, Magdalen Spatz, NP   polyethylene glycol (MIRALAX / GLYCOLAX) packet 17 g, 17 g, Oral, Daily PRN, Magdalen Spatz, NP Labs CBC    Component Value Date/Time   WBC 8.2 03/25/2021 0232   RBC 4.52 03/25/2021 0232   HGB 13.0 03/25/2021 0232   HCT 39.7 03/25/2021 0232   PLT 176 03/25/2021 0232   MCV 87.8 03/25/2021 0232   MCH 28.8 03/25/2021 0232   MCHC 32.7 03/25/2021 0232   RDW 13.7 03/25/2021 0232   LYMPHSABS 1.3 03/25/2021 0232   MONOABS 0.9 03/25/2021 0232   EOSABS 0.0 03/25/2021 0232   BASOSABS 0.0 03/25/2021 0232    CMP     Component  Value Date/Time   NA 136 03/25/2021 0232   K 3.7 03/25/2021 0232   CL 106 03/25/2021 0232   CO2 23 03/25/2021 0232   GLUCOSE 105 (H) 03/25/2021 0232   BUN 9 03/25/2021 0232   CREATININE 0.73 03/25/2021 0232   CREATININE 0.91 08/31/2015 1135   CALCIUM 8.7 (L) 03/25/2021 0232   PROT 6.0 (L) 03/25/2021 0232   ALBUMIN 2.7 (L) 03/25/2021 0232   AST 65 (H) 03/25/2021 0232   ALT 36 03/25/2021 0232   ALKPHOS 54 03/25/2021 0232   BILITOT 1.3 (H) 03/25/2021 0232   GFRNONAA >60 03/25/2021 0232   GFRNONAA 88 08/31/2015 1135   GFRAA >60 02/03/2016 1726   GFRAA >89 08/31/2015 1135      Imaging I have reviewed images in epic and the results pertinent to this consultation are: MRI completed overnight-no acute infarct, no seizure etiology identified.  There is atrophy in the right greater than left hippocampus with dilatation of the right greater than left temporal horn of the lateral ventricles.  Dural based mass along the right parietal convexity without signal abnormality in the underlying right parietal lobe consistent with a meningioma.  Overnight EEG x2 nights-epileptogenicity from right frontal region, additionally diffuse severe encephalopathy nonspecific etiology but likely related to sedation.   Assessment:  70 year old with past history of hypertension, hepatitis C, prostate cancer presented to the emergency room after he had an episode of unresponsiveness, leftward gaze and reported seizures followed by left-sided weakness. Unclear and questionable history of alcohol abuse at the time of presentation but says he drinks 6 pack of beer every night.  Ethanol level was not collected on arrival. Noncontrasted head CT and CT angiography of the head and neck unremarkable for LVO or evolving infarct. Emergently intubated due to inability to protect his airway. MRI negative for acute stroke.  Dural based right parietal convexity meningioma seen. No seizures noted on the spot EEG done prior to hooking him up to continuous EEG Continuous EEG with right frontal spikes and continuous generalized slowing indicative of epileptogenicity from right frontal region but no seizures were seen throughout recording. After extubation, following commands, mildly confused still but getting better.   Impression New onset seizures-likely alcohol-related.  Given the focality on the EEG on the right side and a right meningioma, that could also be a potential etiology for seizures but there is no signal change in the brain parenchyma adjacent to the meningioma making that less  likely.  Recommendations: Continue Keppra 500 twice daily.  Change to p.o. when able to take p.o. meds. Discontinue LTM Treat with high-dose thiamine while inpatient and continue thiamine supplementation outpatient Watch for signs of withdrawal given heavy alcohol use.  Management per CCM Outpatient neurosurgical consultation for the meningioma Inpatient neurology will sign off and will be available as needed-please call with questions.  D/W Drs Tamala Julian and Raymondo Band - PCCM team -  via secure chat  -- Amie Portland, MD Neurologist [facility] Pager: [phone]

## 2021-03-25 NOTE — Procedures (Addendum)
Patient Name: Dennis Levine  MRN: [id number]  Epilepsy Attending: Lora Havens  Referring Physician/Provider: Dr Amie Portland Duration: 03/25/2021 0300 to 03/25/2021 1057   Patient history:  70 year old being brought in for evaluation for leftward gaze, seizures followed by left-sided weakness. EEG to evaluate for seizure   Level of alertness: awake, asleep   AEDs during EEG study: LEV   Technical aspects: This EEG study was done with scalp electrodes positioned according to the 10-20 International system of electrode placement. Electrical activity was acquired at a sampling rate of 500Hz  and reviewed with a high frequency filter of 70Hz  and a low frequency filter of 1Hz . EEG data were recorded continuously and digitally stored.    Description: The posterior dominant rhythm consists of 8 Hz activity of moderate voltage (25-35 uV) seen predominantly in posterior head regions, symmetric and reactive to eye opening and eye closing. Sleep was characterized by vertex waves, sleep spindles (12 to 14 Hz), maximal frontocentral region.  EEG showed continuous low amplitude 3-6 theta-delta slowing in right frontal region. Frequent spikes were noted in right frontal region. Hyperventilation and photic stimulation were not performed.     Parts of the EEG were difficult interpret due to significant electrode artifact.    ABNORMALITY -Spike, right frontal region -Continuous slow, right frontal region   IMPRESSION: This study showed evidence of epileptogenicity and cortical dysfunction arising from right frontal region likely due to underlying structural abnormality. No seizures were seen throughout the recording.     Lyann Hagstrom [name]

## 2021-03-26 DIAGNOSIS — R569 Unspecified convulsions: Secondary | ICD-10-CM | POA: Diagnosis not present

## 2021-03-26 DIAGNOSIS — E43 Unspecified severe protein-calorie malnutrition: Secondary | ICD-10-CM | POA: Insufficient documentation

## 2021-03-26 LAB — BASIC METABOLIC PANEL
Anion gap: 10 (ref 5–15)
BUN: 13 mg/dL (ref 8–23)
CO2: 21 mmol/L — ABNORMAL LOW (ref 22–32)
Calcium: 8.8 mg/dL — ABNORMAL LOW (ref 8.9–10.3)
Chloride: 104 mmol/L (ref 98–111)
Creatinine, Ser: 0.82 mg/dL (ref 0.61–1.24)
GFR, Estimated: >60 mL/min (ref >60)
Glucose, Bld: 103 mg/dL — ABNORMAL HIGH (ref 70–99)
Potassium: 3.7 mmol/L (ref 3.5–5.1)
Sodium: 135 mmol/L (ref 135–145)

## 2021-03-26 LAB — CBC
HCT: 41.2 % (ref 39.0–52.0)
Hemoglobin: 13.3 g/dL (ref 13.0–17.0)
MCH: 28.7 pg (ref 26.0–34.0)
MCHC: 32.3 g/dL (ref 30.0–36.0)
MCV: 88.8 fL (ref 80.0–100.0)
Platelets: 180 10*3/uL (ref 150–400)
RBC: 4.64 MIL/uL (ref 4.22–5.81)
RDW: 13.6 % (ref 11.5–15.5)
WBC: 6.9 10*3/uL (ref 4.0–10.5)
nRBC: 0 % (ref 0.0–0.2)

## 2021-03-26 LAB — GLUCOSE, CAPILLARY
Glucose-Capillary: 102 mg/dL — ABNORMAL HIGH (ref 70–99)
Glucose-Capillary: 80 mg/dL (ref 70–99)
Glucose-Capillary: 132 mg/dL — ABNORMAL HIGH (ref 70–99)
Glucose-Capillary: 118 mg/dL — ABNORMAL HIGH (ref 70–99)
Glucose-Capillary: 98 mg/dL (ref 70–99)
Glucose-Capillary: 111 mg/dL — ABNORMAL HIGH (ref 70–99)

## 2021-03-26 MED ORDER — AMLODIPINE BESYLATE 10 MG PO TABS
10.0000 mg | ORAL_TABLET | Freq: Every day | ORAL | Status: DC
  Administered 2021-03-26 – 2021-04-14 (×20): 10 mg via ORAL
  Filled 2021-03-26 (×20): qty 1

## 2021-03-26 MED ORDER — METOPROLOL TARTRATE 5 MG/5ML IV SOLN
5.0000 mg | Freq: Four times a day (QID) | INTRAVENOUS | Status: DC | PRN
  Administered 2021-03-26: 5 mg via INTRAVENOUS
  Filled 2021-03-26: qty 5

## 2021-03-26 MED ORDER — LEVETIRACETAM 500 MG PO TABS
500.0000 mg | ORAL_TABLET | Freq: Two times a day (BID) | ORAL | Status: DC
  Administered 2021-03-26 – 2021-04-06 (×23): 500 mg via ORAL
  Filled 2021-03-26 (×25): qty 1

## 2021-03-26 MED ORDER — SODIUM CHLORIDE 0.9 % IV SOLN: INTRAVENOUS | Status: DC | PRN

## 2021-03-26 MED ORDER — POTASSIUM CHLORIDE CRYS ER 20 MEQ PO TBCR
40.0000 meq | EXTENDED_RELEASE_TABLET | Freq: Once | ORAL | Status: AC
  Administered 2021-03-26: 40 meq via ORAL
  Filled 2021-03-26: qty 2

## 2021-03-26 NOTE — Progress Notes (Signed)
Patient had a low CBG of 50 at 2100 and D50 was administered via IV. Patient hypoglycemia resolved with the following reading of 223.

## 2021-03-26 NOTE — Progress Notes (Signed)
°   03/26/21 1325  Assess: MEWS Score  Temp 99.8 F (37.7 C)  BP (!) 170/90  Pulse Rate (!) 127  ECG Heart Rate (!) 131  Resp (!) 28  Level of Consciousness Alert  SpO2 97 %  O2 Device Room Air  Patient Activity (if Appropriate) In bed  Assess: MEWS Score  MEWS Temp 0  MEWS Systolic 0  MEWS Pulse 3  MEWS RR 2  MEWS LOC 0  MEWS Score 5  MEWS Score Color Red  Assess: if the MEWS score is Yellow or Red  Were vital signs taken at a resting state? Yes  Focused Assessment No change from prior assessment  Early Detection of Sepsis Score *See Row Information* Low  MEWS guidelines implemented *See Row Information* Yes  Treat  Pain Scale 0-10  Pain Score 0  Neuro symptoms relieved by Rest;Anti-anxiety medication  Take Vital Signs  Increase Vital Sign Frequency  Red: Q 1hr X 4 then Q 4hr X 4, if remains red, continue Q 4hrs  Escalate  MEWS: Escalate Red: discuss with charge nurse/RN and provider, consider discussing with RRT  Notify: Charge Nurse/RN  Name of Charge Nurse/RN Notified Traci  Date Charge Nurse/RN Notified 03/26/21  Time Charge Nurse/RN Notified 1333  Notify: Provider  Provider Name/Title Adhikari  Date Provider Notified 03/26/21  Time Provider Notified 1336  Notification Type Page  Notification Reason Other (Comment) (RED MEWS)  Provider response No new orders  Date of Provider Response 03/26/21  Time of Provider Response [id number]  Document  Patient Outcome Not stable and remains on department  Progress note created (see row info) Yes

## 2021-03-26 NOTE — Progress Notes (Signed)
PROGRESS NOTE    Dennis Levine  HUD:[id number] DOB: 06-25-1950 DOA: 03/23/2021 PCP: Pcp, No   Chief Complain: Seizure  Brief Narrative: Patient is a 70 year old male with history of prostate cancer, hepatitis C, hypertension, alcohol use who was found down at home by family on 12/13.  He was observed to be seizing during transfer to ER via EMS.  Code stroke was called.  On presentation he was hypoxic, tachypneic.  Patient was intubated in the ED for concern of ongoing seizure, airway protection, admitted under PCCM service.  Neurology was consulted.  Transferred to [facility] service on 12/16.  Currently hemodynamically stable.  Important events:  12/13 admitted with seizures after being found down at home. Intubated 12/14 MRI with no infarct/acute intracranial process, but noted dural based mass consistent with meningioma along R parietal convexity 12/14 Extubated   Assessment & Plan:   Principal Problem:   Seizures ([place])   New onset seizures: Presented with episode of unresponsiveness, left word gaze, seizures followed by left-sided weakness.  MRI of the brain did not show any acute infarct or intracranial process, no definitive etiology for seizure.  But showed dural based mass along the right bilateral convexity consistent with meningioma.  EEG showed epileptogenicity from the right frontal region but no active seizures.  Neurology was following.  Currently on Keppra.  He needs to follow-up with neurology as an outpatient.  Neurology thought that alcohol could be the etiology for seizure.  Meningioma: Described as above.    He needs to follow-up with neurosurgery as an outpatient.  No signal change in the brain parenchyma adjacent to the meningioma making it less likely the cause for seizure  Acute hypoxic respiratory failure required intubation: Intubated on 12/13 for airway protection.  Extubated on 12/14.  Currently on room air.  Hypertension: Takes amlodipine at home.Continue as  needed medications.  Monitor blood pressure.  Post ictal state/delirium: Hospital course remarkable for delirium.  Was given Precedex in the ICU.  Monitor mental status.  Confused at times.  But this morning he was alert and oriented.  On Zyprexa  Hepatitis C/prostate cancer: Management as outpatient.  Alcohol withdrawal/alcohol abuse: Drinks 6 packs of beer every day.  Counseled for cessation.  Continue thiamine and folic acid.  Currently on high-dose thiamine.  Likely in withdrawl. CIWA protocol,also on phenobarbital.  Debility/deconditioning: PT/OT recommending skilled nursing facility on discharge.    Nutrition Problem: Severe Malnutrition Etiology: chronic illness (h/o cancer, hep C)      DVT prophylaxis:Lovenox Code Status: Full Family Communication: Niece on phone on 12/16 Patient status:Inpatient  Dispo: The patient is from: Home              Anticipated d/c is to: SNF              Anticipated d/c date is: In next 2-3 days  Consultants: PCCM, neurology  Procedures: Intubation, EEG  Antimicrobials:  Anti-infectives (From admission, onward)    None       Subjective:  Patient seen and examined at the bedside this morning.  Hemodynamically stable during my evaluation, on sinus tachycardia.  Denies any complaints to me today.  He was restrained on the bed by abdominal restraint.  He knew the current day, month and year.   Objective: Vitals:   03/25/21 2333 03/26/21 0047 03/26/21 0336 03/26/21 0500  BP:  (!) 196/134    Pulse:      Resp:      Temp: 98.4 F (36.9 C)  99.5  F (37.5 C)   TempSrc: Oral  Axillary   SpO2:      Weight:    64 kg  Height:        Intake/Output Summary (Last 24 hours) at 03/26/2021 0714 Last data filed at 03/25/2021 2010 Gross per 24 hour  Intake 946.9 ml  Output --  Net 946.9 ml   Filed Weights   03/24/21 0238 03/25/21 0500 03/26/21 0500  Weight: 67.3 kg 63.4 kg 64 kg    Examination:  General exam: Overall comfortable,  not in distress HEENT: PERRL Respiratory system:  no wheezes or crackles  Cardiovascular system: sinus tachycardia Gastrointestinal system: Abdomen is nondistended, soft and nontender. Central nervous system: Alert and oriented Extremities: No edema, no clubbing ,no cyanosis Skin: No rashes, no ulcers,no icterus      Data Reviewed: I have personally reviewed following labs and imaging studies  CBC: Recent Labs  Lab 03/23/21 1107 03/23/21 1110 03/23/21 1236 03/24/21 0455 03/24/21 0621 03/25/21 0232 03/26/21 0150  WBC 7.0  --   --   --  6.3 8.2 6.9  NEUTROABS 3.3  --   --   --   --  5.9  --   HGB 14.5   < > 13.9 12.6* 11.9* 13.0 13.3  HCT 47.5   < > 41.0 37.0* 37.2* 39.7 41.2  MCV 93.5  --   --   --  90.7 87.8 88.8  PLT 210  --   --   --  175 176 180   < > = values in this interval not displayed.   Basic Metabolic Panel: Recent Labs  Lab 03/23/21 1107 03/23/21 1110 03/23/21 1236 03/23/21 1340 03/24/21 0455 03/24/21 0621 03/25/21 0232 03/26/21 0150  NA 136 138 138  --  137 134* 136 135  K 4.0 3.9 3.9  --  3.7 3.7 3.7 3.7  CL 104 106  --   --   --  103 106 104  CO2 19*  --   --   --   --  25 23 21*  GLUCOSE 142* 138*  --   --   --  108* 105* 103*  BUN 7* 6*  --   --   --  9 9 13   CREATININE 0.90 0.60*  --   --   --  0.86 0.73 0.82  CALCIUM 8.8*  --   --   --   --  8.2* 8.7* 8.8*  MG  --   --   --  1.8  --  1.7 1.7  --   PHOS  --   --   --  3.3  --  3.7  --   --    GFR: Estimated Creatinine Clearance: 74.8 mL/min (by C-G formula based on SCr of 0.82 mg/dL). Liver Function Tests: Recent Labs  Lab 03/23/21 1107 03/25/21 0232  AST 48* 65*  ALT 32 36  ALKPHOS 69 54  BILITOT 0.6 1.3*  PROT 7.0 6.0*  ALBUMIN 3.5 2.7*   No results for input(s): LIPASE, AMYLASE in the last 168 hours. No results for input(s): AMMONIA in the last 168 hours. Coagulation Profile: Recent Labs  Lab 03/23/21 1107  INR 1.0   Cardiac Enzymes: Recent Labs  Lab 03/23/21 1340   CKTOTAL 166   BNP (last 3 results) No results for input(s): PROBNP in the last 8760 hours. HbA1C: Recent Labs    03/23/21 1340  HGBA1C 6.2*   CBG: Recent Labs  Lab 03/25/21 1935 03/25/21 2100 03/25/21  2122 03/25/21 2332 03/26/21 0334  GLUCAP 72 50* 223* 80 102*   Lipid Profile: Recent Labs    03/24/21 0621  TRIG 43   Thyroid Function Tests: No results for input(s): TSH, T4TOTAL, FREET4, T3FREE, THYROIDAB in the last 72 hours. Anemia Panel: No results for input(s): VITAMINB12, FOLATE, FERRITIN, TIBC, IRON, RETICCTPCT in the last 72 hours. Sepsis Labs: Recent Labs  Lab 03/23/21 1320 03/23/21 1652  LATICACIDVEN 2.0* 1.3    Recent Results (from the past 240 hour(s))  Resp Panel by RT-PCR (Flu A&B, Covid) Nasopharyngeal Swab     Status: None   Collection Time: 03/23/21  4:46 PM   Specimen: Nasopharyngeal Swab; Nasopharyngeal(NP) swabs in vial transport medium  Result Value Ref Range Status   SARS Coronavirus 2 by RT PCR NEGATIVE NEGATIVE Final    Comment: (NOTE) SARS-CoV-2 target nucleic acids are NOT DETECTED.  The SARS-CoV-2 RNA is generally detectable in upper respiratory specimens during the acute phase of infection. The lowest concentration of SARS-CoV-2 viral copies this assay can detect is 138 copies/mL. A negative result does not preclude SARS-Cov-2 infection and should not be used as the sole basis for treatment or other patient management decisions. A negative result may occur with  improper specimen collection/handling, submission of specimen other than nasopharyngeal swab, presence of viral mutation(s) within the areas targeted by this assay, and inadequate number of viral copies(<138 copies/mL). A negative result must be combined with clinical observations, patient history, and epidemiological information. The expected result is Negative.  Fact Sheet for Patients:  EntrepreneurPulse.com.au  Fact Sheet for Healthcare Providers:   IncredibleEmployment.be  This test is no t yet approved or cleared by the [place] FDA and  has been authorized for detection and/or diagnosis of SARS-CoV-2 by FDA under an Emergency Use Authorization (EUA). This EUA will remain  in effect (meaning this test can be used) for the duration of the COVID-19 declaration under Section 564(b)(1) of the Act, 21 U.S.C.section 360bbb-3(b)(1), unless the authorization is terminated  or revoked sooner.       Influenza A by PCR NEGATIVE NEGATIVE Final   Influenza B by PCR NEGATIVE NEGATIVE Final    Comment: (NOTE) The Xpert Xpress SARS-CoV-2/FLU/RSV plus assay is intended as an aid in the diagnosis of influenza from Nasopharyngeal swab specimens and should not be used as a sole basis for treatment. Nasal washings and aspirates are unacceptable for Xpert Xpress SARS-CoV-2/FLU/RSV testing.  Fact Sheet for Patients: EntrepreneurPulse.com.au  Fact Sheet for Healthcare Providers: IncredibleEmployment.be  This test is not yet approved or cleared by the [place] FDA and has been authorized for detection and/or diagnosis of SARS-CoV-2 by FDA under an Emergency Use Authorization (EUA). This EUA will remain in effect (meaning this test can be used) for the duration of the COVID-19 declaration under Section 564(b)(1) of the Act, 21 U.S.C. section 360bbb-3(b)(1), unless the authorization is terminated or revoked.  Performed at [facility], [place] [address] [place], [place] [address]   MRSA Next Gen by PCR, Nasal     Status: None   Collection Time: 03/23/21  8:11 PM   Specimen: Nasal Mucosa; Nasal Swab  Result Value Ref Range Status   MRSA by PCR Next Gen NOT DETECTED NOT DETECTED Final    Comment: (NOTE) The GeneXpert MRSA Assay (FDA approved for NASAL specimens only), is one component of a comprehensive MRSA colonization surveillance program. It is not intended to diagnose  MRSA infection nor to guide or monitor treatment for MRSA  infections. Test performance is not FDA approved in patients less than 37 years old. Performed at [facility], [place] [address], [place], [place] [address]          Radiology Studies: Overnight EEG with video  Result Date: 03/24/2021 Lora Havens, MD     03/25/2021  9:33 AM Patient Name: Dennis Levine MRN: [id number] Epilepsy Attending: Lora Havens Referring Physician/Provider: Dr Amie [place] Duration: 03/23/2021 1325 to [place] 03/24/2021 0810 to 03/25/2021 0300  Patient history:  70 year old being brought in for evaluation for leftward gaze, seizures followed by left-sided weakness. EEG to evaluate for seizure  Level of alertness: lethargic  AEDs during EEG study: LEV, ativan, propofol  Technical aspects: This EEG study was done with scalp electrodes positioned according to the 10-20 International system of electrode placement. Electrical activity was acquired at a sampling rate of 500Hz  and reviewed with a high frequency filter of 70Hz  and a low frequency filter of 1Hz . EEG data were recorded continuously and digitally stored.  Description: EEG showed continuous generalized low amplitude 3-6 theta-delta slowing admixed with 15 to 18 Hz generalized beta activity predominantly in bilateral frontocentral regions. Frequent spikes were noted in right frontal region. Hyperventilation and photic stimulation were not performed.   Of note, study was technically difficult due to significant myogenic artifact.  Additionally, machine was unplugged at [place] on 04/09/2024 during patient transferred and not replugged until 0810 on 03/24/2021.  ABNORMALITY -Spike, right frontal region -Continuous slow, generalized  IMPRESSION: This study showed evidence of epileptogenicity arising from right frontal region. Additionally there is severe diffuse encephalopathy, nonspecific etiology but most likely due to sedation. No seizures were seen  throughout the recording.  Lora Havens   ECHOCARDIOGRAM COMPLETE  Result Date: 03/24/2021    ECHOCARDIOGRAM REPORT   Patient Name:   Dennis Levine Date of Exam: 03/24/2021 Medical Rec #:  [id number]       Height:       70.0 in Accession #:    [id number]      Weight:       148.4 lb Date of Birth:  [birth year]       BSA:          1.839 m Patient Age:    35 years        BP:           125/76 mmHg Patient Gender: M               HR:           79 bpm. Exam Location:  Inpatient Procedure: 2D Echo, Cardiac Doppler and Color Doppler Indications:    Dyspnea R06.00  History:        Patient has no prior history of Echocardiogram examinations.                 Risk Factors:Hypertension.  Sonographer:    Darlina Sicilian RDCS Referring Phys: [place]  1. Left ventricular ejection fraction, by estimation, is 65 to 70%. The left ventricle has normal function. The left ventricle has no regional wall motion abnormalities. Left ventricular diastolic parameters are consistent with Grade I diastolic dysfunction (impaired relaxation).  2. Right ventricular systolic function is normal. The right ventricular size is normal. There is normal pulmonary artery systolic pressure.  3. The mitral valve is grossly normal. No evidence of mitral valve regurgitation. No evidence of mitral stenosis.  4. The aortic valve was not well visualized. Aortic  valve regurgitation is not visualized. No aortic stenosis is present.  5. The inferior vena cava is dilated in size with >50% respiratory variability, suggesting right atrial pressure of 8 mmHg. Comparison(s): No prior Echocardiogram. FINDINGS  Left Ventricle: Left ventricular ejection fraction, by estimation, is 65 to 70%. The left ventricle has normal function. The left ventricle has no regional wall motion abnormalities. The left ventricular internal cavity size was normal in size. There is  no left ventricular hypertrophy. Left ventricular diastolic parameters are  consistent with Grade I diastolic dysfunction (impaired relaxation). Right Ventricle: The right ventricular size is normal. No increase in right ventricular wall thickness. Right ventricular systolic function is normal. There is normal pulmonary artery systolic pressure. The tricuspid regurgitant velocity is 2.26 m/s, and  with an assumed right atrial pressure of 8 mmHg, the estimated right ventricular systolic pressure is 35.5 mmHg. Left Atrium: Left atrial size was normal in size. Right Atrium: Right atrial size was normal in size. Pericardium: There is no evidence of pericardial effusion. Mitral Valve: The mitral valve is grossly normal. No evidence of mitral valve regurgitation. No evidence of mitral valve stenosis. Tricuspid Valve: The tricuspid valve is normal in structure. Tricuspid valve regurgitation is mild . No evidence of tricuspid stenosis. Aortic Valve: The aortic valve was not well visualized. Aortic valve regurgitation is not visualized. No aortic stenosis is present. Pulmonic Valve: The pulmonic valve was not well visualized. Pulmonic valve regurgitation is trivial. No evidence of pulmonic stenosis. Aorta: The aortic root and ascending aorta are structurally normal, with no evidence of dilitation. Venous: The inferior vena cava is dilated in size with greater than 50% respiratory variability, suggesting right atrial pressure of 8 mmHg. IAS/Shunts: The atrial septum is grossly normal.  LEFT VENTRICLE PLAX 2D LVIDd:         4.30 cm   Diastology LVIDs:         2.30 cm   LV e' medial:    4.86 cm/s LV PW:         1.00 cm   LV E/e' medial:  11.3 LV IVS:        1.00 cm   LV e' lateral:   6.04 cm/s LVOT diam:     1.90 cm   LV E/e' lateral: 9.1 LV SV:         45 LV SV Index:   25 LVOT Area:     2.84 cm  RIGHT VENTRICLE RV S prime:     11.30 cm/s TAPSE (M-mode): 2.3 cm LEFT ATRIUM           Index        RIGHT ATRIUM          Index LA diam:      1.90 cm 1.03 cm/m   RA Area:     8.96 cm LA Vol (A2C): 12.3 ml  6.69 ml/m   RA Volume:   15.00 ml 8.16 ml/m LA Vol (A4C): 19.7 ml 10.72 ml/m  AORTIC VALVE LVOT Vmax:   71.60 cm/s LVOT Vmean:  50.600 cm/s LVOT VTI:    0.159 m  AORTA Ao Root diam: 3.10 cm MITRAL VALVE               TRICUSPID VALVE MV Area (PHT): 3.60 cm    TR Peak grad:   20.4 mmHg MV Decel Time: 211 msec    TR Vmax:        226.00 cm/s MV E velocity: 54.85 cm/s MV A velocity: 70.05  cm/s  SHUNTS MV E/A ratio:  0.78        Systemic VTI:  0.16 m                            Systemic Diam: 1.90 cm Rudean Haskell MD Electronically signed by Rudean Haskell MD Signature Date/Time: 03/24/2021/3:14:44 PM    Final         Scheduled Meds:  Chlorhexidine Gluconate Cloth  6 each Topical Daily   enoxaparin (LOVENOX) injection  40 mg Subcutaneous Daily   feeding supplement  237 mL Oral TID BM   folic acid  1 mg Oral Daily   multivitamin with minerals  1 tablet Oral Daily   OLANZapine zydis  10 mg Sublingual Daily   PHENObarbital  130 mg Intravenous TID   Followed by   Derrill Memo ON 03/27/2021] PHENObarbital  65 mg Intravenous TID   [START ON 04/02/2021] thiamine injection  100 mg Intravenous Daily   Continuous Infusions:  sodium chloride 10 mL/hr at 03/26/21 0155   dexmedetomidine (PRECEDEX) IV infusion Stopped (03/25/21 1242)   levETIRAcetam 500 mg (03/26/21 0155)   thiamine injection 500 mg (03/26/21 0347)   Followed by   Derrill Memo ON 03/27/2021] thiamine injection       LOS: 3 days    Time spent: 35 mins.More than 50% of that time was spent in counseling and/or coordination of care.      Shelly Coss, MD [facility] P12/16/2022, 7:14 AM

## 2021-03-26 NOTE — Evaluation (Addendum)
Physical Therapy Evaluation Patient Details Name: Dennis Levine MRN: [id number] DOB: [birth year] Today's Date: 03/26/2021  History of Present Illness  70 yo admitted 12/13 after found unresponsive in bathroom with witnessed seizure and intubated 12/13-12/14. Head CT (-), MRI demonstrated Rt meningioma. PMhx: ETOH abuse, HTN, hep C  Clinical Impression  Pt pleasantly confused and not oriented to time or situation with decreased safety awareness. Pt limited by HR with rise from 130>160 with 10' of gait and maintained 130-140 at rest. Pt lives alone and reports he works "doing everything". Pt with decreased cognition, cardiopulmonary function, decreased strength balance and safety who will benefit from acute therapy to maximize mobility and independence. Pt without family support and would have to demonstrate independence for return home and currently recommend ST-SNF. Pt with noted bil UE edema and redness.  98% RA HR 115-160       Recommendations for follow up therapy are one component of a multi-disciplinary discharge planning process, led by the attending physician.  Recommendations may be updated based on patient status, additional functional criteria and insurance authorization.  Follow Up Recommendations Skilled nursing-short term rehab (<3 hours/day)    Assistance Recommended at Discharge Frequent or constant Supervision/Assistance  Functional Status Assessment Patient has had a recent decline in their functional status and demonstrates the ability to make significant improvements in function in a reasonable and predictable amount of time.  Equipment Recommendations  Rolling walker (2 wheels);BSC/3in1    Recommendations for Other Services       Precautions / Restrictions Precautions Precautions: Fall;Other (comment) Precaution Comments: watch HR Restrictions Weight Bearing Restrictions: No      Mobility  Bed Mobility Overal bed mobility: Needs Assistance Bed Mobility:  Supine to Sit     Supine to sit: Min guard;HOB elevated     General bed mobility comments: HOB 25 degrees with guarding for lines    Transfers Overall transfer level: Needs assistance   Transfers: Sit to/from Stand Sit to Stand: Min assist;+2 safety/equipment           General transfer comment: +2 assist for lines to stand    Ambulation/Gait Ambulation/Gait assistance: Min assist;+2 safety/equipment Gait Distance (Feet): 10 Feet Assistive device: 1 person hand held assist Gait Pattern/deviations: Step-through pattern;Decreased stride length   Gait velocity interpretation: 1.31 - 2.62 ft/sec, indicative of limited community ambulator   General Gait Details: +2 assist for lines with unsteady gait and HR 130>160 with limited gait limiting activity. pt seated in chair with HR 135-140. RN present and aware and agreeable for pt to be in chair  Stairs            Wheelchair Mobility    Modified Rankin (Stroke Patients Only)       Balance Overall balance assessment: Needs assistance Sitting-balance support: Feet supported Sitting balance-Leahy Scale: Fair Sitting balance - Comments: guarding for balance and lines   Standing balance support: No upper extremity supported;Single extremity supported Standing balance-Leahy Scale: Poor Standing balance comment: single UE support in standing, pt reaching for environmental support                             Pertinent Vitals/Pain Pain Assessment: No/denies pain    Home Living Family/patient expects to be discharged to:: Private residence Living Arrangements: Alone   Type of Home: Apartment Home Access: Stairs to enter Entrance Stairs-Rails: None Entrance Stairs-Number of Steps: 3   Home Layout: One level Home Equipment: None  Prior Function Prior Level of Function : Independent/Modified Independent                     Hand Dominance        Extremity/Trunk Assessment   Upper  Extremity Assessment Upper Extremity Assessment: Defer to OT evaluation    Lower Extremity Assessment Lower Extremity Assessment: Generalized weakness    Cervical / Trunk Assessment Cervical / Trunk Assessment: Normal  Communication   Communication: No difficulties  Cognition Arousal/Alertness: Awake/alert Behavior During Therapy: WFL for tasks assessed/performed Overall Cognitive Status: Impaired/Different from baseline Area of Impairment: Orientation;Memory;Safety/judgement;Awareness;Following commands;Problem solving                 Orientation Level: Disoriented to;Time;Situation   Memory: Decreased short-term memory Following Commands: Follows one step commands consistently Safety/Judgement: Decreased awareness of safety;Decreased awareness of deficits   Problem Solving: Slow processing General Comments: pt stating year "1999" and president as "wilson"        General Comments      Exercises     Assessment/Plan    PT Assessment Patient needs continued PT services  PT Problem List Decreased mobility;Decreased activity tolerance;Decreased balance;Decreased knowledge of use of DME;Decreased cognition;Cardiopulmonary status limiting activity;Decreased coordination;Decreased safety awareness       PT Treatment Interventions Gait training;DME instruction;Therapeutic exercise;Functional mobility training;Therapeutic activities;Patient/family education;Cognitive remediation;Balance training;Stair training    PT Goals (Current goals can be found in the Care Plan section)  Acute Rehab PT Goals Patient Stated Goal: return home, work PT Goal Formulation: With patient Time For Goal Achievement: 04/09/21 Potential to Achieve Goals: Fair    Frequency Min 3X/week   Barriers to discharge Decreased caregiver support      Co-evaluation PT/OT/SLP Co-Evaluation/Treatment: Yes Reason for Co-Treatment: For patient/therapist safety PT goals addressed during session:  Mobility/safety with mobility         AM-PAC PT "6 Clicks" Mobility  Outcome Measure Help needed turning from your back to your side while in a flat bed without using bedrails?: A Little Help needed moving from lying on your back to sitting on the side of a flat bed without using bedrails?: A Little Help needed moving to and from a bed to a chair (including a wheelchair)?: A Lot Help needed standing up from a chair using your arms (e.g., wheelchair or bedside chair)?: A Little Help needed to walk in hospital room?: A Lot Help needed climbing 3-5 steps with a railing? : Total 6 Click Score: 14    End of Session   Activity Tolerance: Treatment limited secondary to medical complications (Comment) Patient left: in chair;with call bell/phone within reach;with chair alarm set;with restraints reapplied;with nursing/sitter in room Nurse Communication: Mobility status PT Visit Diagnosis: Other abnormalities of gait and mobility (R26.89);Difficulty in walking, not elsewhere classified (R26.2);Muscle weakness (generalized) (M62.81);Unsteadiness on feet (R26.81)    Time: 1610-9604 PT Time Calculation (min) (ACUTE ONLY): 21 min   Charges:   PT Evaluation $PT Eval Moderate Complexity: [place], PT Acute Rehabilitation Services Pager: [phone] Office: [place] Cheikh Bramble 03/26/2021, 10:55 AM

## 2021-03-26 NOTE — Progress Notes (Signed)
Speech Language Pathology Treatment: Dysphagia  Patient Details Name: Dennis Levine MRN: [id number] DOB: [birth year] Today's Date: 03/26/2021 Time: 3875-6433 SLP Time Calculation (min) (ACUTE ONLY): 10 min  Assessment / Plan / Recommendation Clinical Impression  Pt was seen for dysphagia treatment. Pt's RN, Jacqulyn Bath, reported that the pt has been tolerating meds without difficulty. Pt tolerated liquids and dysphagia 3 solids without difficulty, but exhibited significant coughing with meats and harder potatoes. Pt denied any sensation of aspiration and some coughing was noted at baseline, so the possibility of coughing being unrelated is considered. However, due to the severity of coughing and it's consistent alignment with regular texture solids, his diet will be modified to dysphagia 3 and thin liquids and SLP will continue to follow pt.     HPI HPI: Pt is a 70 year old male who was found down at home by family 12/13 after they heard him collapse in the bathroom. Pt admitted with seizures and intubated. ETT 12/13-12/14. MRI brain negative. 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 PMH: history of prostate cancer, Hepatitis C , and hypertension.      SLP Plan  Continue with current plan of care      Recommendations for follow up therapy are one component of a multi-disciplinary discharge planning process, led by the attending physician.  Recommendations may be updated based on patient status, additional functional criteria and insurance authorization.    Recommendations  Diet recommendations: Thin liquid;Dysphagia 3 (mechanical soft) Liquids provided via: Cup;Straw Medication Administration: Whole meds with liquid Supervision: Patient able to self feed Compensations: Slow rate                Oral Care Recommendations: Oral care BID Follow Up Recommendations: No SLP follow up Assistance recommended at discharge: None SLP Visit Diagnosis: Dysphagia, unspecified  (R13.10) Plan: Continue with current plan of care        Caelan Branden I. Hardin Negus, [place], [place] Office number [phone] Pager [place]  03/26/2021, 12:36 PM

## 2021-03-26 NOTE — Evaluation (Signed)
Occupational Therapy Evaluation Patient Details Name: Dennis Levine MRN: [id number] DOB: 1950/05/26 Today's Date: 03/26/2021   History of Present Illness 70 yo admitted 12/13 after found unresponsive in bathroom with witnessed seizure and intubated 12/13-12/14. Head CT (-), MRI demonstrated Rt meningioma. PMhx: ETOH abuse, HTN, hep C   Clinical Impression   Patient admitted for the diagnosis above.  PTA he lives alone in an apartment with 3 STE.  He uses pubic transportation, and needed no assist with mobility, ADL, or IADL.  Deficits impacting independence are listed below.  Currently he is needing up to Mod A for lower body ADL from and sit/stand level, and up to Min A of one for basic mobility.  Currently the patient would need 24 hour assist to return home.  Unfortunately, he does not have any friends/family that can assist him.  OT will follow in the acute setting, but SNF for short term rehab is recommended prior to returning home.       Recommendations for follow up therapy are one component of a multi-disciplinary discharge planning process, led by the attending physician.  Recommendations may be updated based on patient status, additional functional criteria and insurance authorization.   Follow Up Recommendations  Skilled nursing-short term rehab (<3 hours/day)    Assistance Recommended at Discharge Frequent or constant Supervision/Assistance  Functional Status Assessment  Patient has had a recent decline in their functional status and demonstrates the ability to make significant improvements in function in a reasonable and predictable amount of time.  Equipment Recommendations  Tub/shower seat    Recommendations for Other Services       Precautions / Restrictions Precautions Precautions: Fall;Other (comment) Precaution Comments: watch HR Restrictions Weight Bearing Restrictions: No      Mobility Bed Mobility Overal bed mobility: Needs Assistance Bed Mobility: Supine  to Sit     Supine to sit: Min guard;HOB elevated        Transfers Overall transfer level: Needs assistance   Transfers: Sit to/from Stand Sit to Stand: Min assist;+2 safety/equipment           General transfer comment: +2 assist for lines to stand      Balance Overall balance assessment: Needs assistance Sitting-balance support: Feet supported Sitting balance-Leahy Scale: Fair Sitting balance - Comments: guarding for balance and lines   Standing balance support: No upper extremity supported;Single extremity supported Standing balance-Leahy Scale: Poor Standing balance comment: single UE support in standing, pt reaching for environmental support                           ADL either performed or assessed with clinical judgement   ADL Overall ADL's : Needs assistance/impaired Eating/Feeding: Set up;Sitting   Grooming: Wash/dry hands;Wash/dry face;Set up;Sitting   Upper Body Bathing: Set up;Cueing for sequencing;Sitting   Lower Body Bathing: Minimal assistance;Sit to/from stand   Upper Body Dressing : Minimal assistance;Sitting   Lower Body Dressing: Moderate assistance;Sit to/from stand   Toilet Transfer: Minimal assistance           Functional mobility during ADLs: Minimal assistance;Cueing for safety       Vision Baseline Vision/History: 1 Wears glasses Patient Visual Report: No change from baseline       Perception Perception Perception: Not tested   Praxis Praxis Praxis: Not tested    Pertinent Vitals/Pain Pain Assessment: No/denies pain     Hand Dominance Right   Extremity/Trunk Assessment Upper Extremity Assessment Upper Extremity Assessment: LUE  deficits/detail LUE Deficits / Details: chronic L shoulder dysfunction limiting FF LUE Sensation: WNL LUE Coordination: WNL   Lower Extremity Assessment Lower Extremity Assessment: Defer to PT evaluation   Cervical / Trunk Assessment Cervical / Trunk Assessment: Normal    Communication Communication Communication: No difficulties   Cognition Arousal/Alertness: Awake/alert Behavior During Therapy: WFL for tasks assessed/performed Overall Cognitive Status: Impaired/Different from baseline Area of Impairment: Orientation;Memory;Safety/judgement;Awareness;Following commands;Problem solving                 Orientation Level: Disoriented to;Time;Situation   Memory: Decreased short-term memory Following Commands: Follows one step commands consistently Safety/Judgement: Decreased awareness of safety;Decreased awareness of deficits   Problem Solving: Slow processing      General Comments       Exercises     Shoulder Instructions      Home Living Family/patient expects to be discharged to:: Private residence Living Arrangements: Alone   Type of Home: Apartment Home Access: Stairs to enter Entrance Stairs-Number of Steps: 3 Entrance Stairs-Rails: None Home Layout: One level     Bathroom Shower/Tub: Occupational psychologist: Standard     Home Equipment: None          Prior Functioning/Environment Prior Level of Function : Independent/Modified Independent                        OT Problem List: Decreased activity tolerance;Decreased coordination;Impaired balance (sitting and/or standing);Decreased safety awareness;Decreased cognition;Decreased knowledge of use of DME or AE;Increased edema      OT Treatment/Interventions: Self-care/ADL training;Therapeutic exercise;Balance training;Therapeutic activities;Cognitive remediation/compensation;DME and/or AE instruction;Patient/family education    OT Goals(Current goals can be found in the care plan section) Acute Rehab OT Goals Patient Stated Goal: wanting to return home OT Goal Formulation: With patient Time For Goal Achievement: 04/09/21 Potential to Achieve Goals: Good ADL Goals Pt Will Perform Grooming: with supervision;standing Pt Will Perform Lower Body  Bathing: with supervision;sit to/from stand Pt Will Perform Lower Body Dressing: with supervision;sit to/from stand Pt Will Transfer to Toilet: ambulating;with supervision Pt Will Perform Toileting - Clothing Manipulation and hygiene: with supervision;sit to/from stand  OT Frequency: Min 2X/week   Barriers to D/C: Decreased caregiver support          Co-evaluation PT/OT/SLP Co-Evaluation/Treatment: Yes Reason for Co-Treatment: Complexity of the patient's impairments (multi-system involvement);Necessary to address cognition/behavior during functional activity;To address functional/ADL transfers PT goals addressed during session: Mobility/safety with mobility OT goals addressed during session: ADL's and self-care      AM-PAC OT "6 Clicks" Daily Activity     Outcome Measure Help from another person eating meals?: A Little Help from another person taking care of personal grooming?: A Little Help from another person toileting, which includes using toliet, bedpan, or urinal?: A Lot Help from another person bathing (including washing, rinsing, drying)?: A Lot Help from another person to put on and taking off regular upper body clothing?: A Lot Help from another person to put on and taking off regular lower body clothing?: A Lot 6 Click Score: 14   End of Session Equipment Utilized During Treatment: Gait belt  Activity Tolerance: Patient tolerated treatment well Patient left: in chair;with call bell/phone within reach;with chair alarm set;with restraints reapplied  OT Visit Diagnosis: Unsteadiness on feet (R26.81);Other symptoms and signs involving cognitive function                Time: 0945-1006 OT Time Calculation (min): 21 min Charges:  OT General Charges $  OT Visit: 1 Visit OT Evaluation $OT Eval Moderate Complexity: 1 Mod  03/26/2021  RP, OTR/L  Acute Rehabilitation Services  Office:  [phone]   Metta Clines 03/26/2021, 11:10 AM

## 2021-03-27 ENCOUNTER — Inpatient Hospital Stay (HOSPITAL_COMMUNITY): Payer: Medicare Other

## 2021-03-27 DIAGNOSIS — R569 Unspecified convulsions: Secondary | ICD-10-CM | POA: Diagnosis not present

## 2021-03-27 LAB — BASIC METABOLIC PANEL
Anion gap: 7 (ref 5–15)
BUN: 5 mg/dL — ABNORMAL LOW (ref 8–23)
CO2: 26 mmol/L (ref 22–32)
Calcium: 8.4 mg/dL — ABNORMAL LOW (ref 8.9–10.3)
Chloride: 102 mmol/L (ref 98–111)
Creatinine, Ser: 0.9 mg/dL (ref 0.61–1.24)
GFR, Estimated: >60 mL/min (ref >60)
Glucose, Bld: 115 mg/dL — ABNORMAL HIGH (ref 70–99)
Potassium: 3.7 mmol/L (ref 3.5–5.1)
Sodium: 135 mmol/L (ref 135–145)

## 2021-03-27 LAB — CBC
HCT: 41.8 % (ref 39.0–52.0)
Hemoglobin: 13.6 g/dL (ref 13.0–17.0)
MCH: 28.3 pg (ref 26.0–34.0)
MCHC: 32.5 g/dL (ref 30.0–36.0)
MCV: 87.1 fL (ref 80.0–100.0)
Platelets: 196 10*3/uL (ref 150–400)
RBC: 4.8 MIL/uL (ref 4.22–5.81)
RDW: 13.7 % (ref 11.5–15.5)
WBC: 7.1 10*3/uL (ref 4.0–10.5)
nRBC: 0 % (ref 0.0–0.2)

## 2021-03-27 LAB — GLUCOSE, CAPILLARY
Glucose-Capillary: 113 mg/dL — ABNORMAL HIGH (ref 70–99)
Glucose-Capillary: 119 mg/dL — ABNORMAL HIGH (ref 70–99)
Glucose-Capillary: 99 mg/dL (ref 70–99)
Glucose-Capillary: 108 mg/dL — ABNORMAL HIGH (ref 70–99)
Glucose-Capillary: 115 mg/dL — ABNORMAL HIGH (ref 70–99)

## 2021-03-27 MED ORDER — ACETAMINOPHEN 650 MG RE SUPP: 650.0000 mg | Freq: Four times a day (QID) | RECTAL | Status: DC | PRN

## 2021-03-27 MED ORDER — METOPROLOL TARTRATE 25 MG PO TABS
25.0000 mg | ORAL_TABLET | Freq: Two times a day (BID) | ORAL | Status: DC
  Administered 2021-03-27 – 2021-04-14 (×37): 25 mg via ORAL
  Filled 2021-03-27 (×37): qty 1

## 2021-03-27 MED ORDER — ACETAMINOPHEN 325 MG PO TABS
650.0000 mg | ORAL_TABLET | Freq: Four times a day (QID) | ORAL | Status: DC | PRN
  Administered 2021-03-27 – 2021-04-08 (×11): 650 mg via ORAL
  Filled 2021-03-27 (×11): qty 2

## 2021-03-27 MED ORDER — FUROSEMIDE 10 MG/ML IJ SOLN
20.0000 mg | Freq: Once | INTRAMUSCULAR | Status: AC
  Administered 2021-03-27: 20 mg via INTRAVENOUS
  Filled 2021-03-27: qty 4

## 2021-03-27 NOTE — NC FL2 (Signed)
[place] LEVEL OF CARE SCREENING TOOL     IDENTIFICATION  Patient Name: Dennis Levine Birthdate: 1950/07/29 Sex: male Admission Date (Current Location): 03/23/2021  [facility] and [place] Number:  Herbalist and Address:  [facility], [place] [address], [place], [place] [address]      Provider Number: [id number]  Attending Physician Name and Address:  Shelly Coss, MD  Relative Name and Phone Number:  Jemel, Ono)   [phone]    Current Level of Care: Hospital Recommended Level of Care: [place] Prior Approval Number:    Date Approved/Denied:   PASRR Number: [id number]  Discharge Plan: SNF    Current Diagnoses: Patient Active Problem List   Diagnosis Date Noted   Protein-calorie malnutrition, severe 03/26/2021   Seizures ([place]) 03/23/2021   Prostate cancer ([place]) 02/03/2016    Orientation RESPIRATION BLADDER Height & Weight     Self  Normal Incontinent, External catheter Weight: 143 lb 11.8 oz (65.2 kg) Height:  5\' 10"  (177.8 cm)  BEHAVIORAL SYMPTOMS/MOOD NEUROLOGICAL BOWEL NUTRITION STATUS      Incontinent Diet (See DC Summary)  AMBULATORY STATUS COMMUNICATION OF NEEDS Skin   Limited Assist Verbally Normal                       Personal Care Assistance Level of Assistance  Bathing, Feeding, Dressing Bathing Assistance: Limited assistance Feeding assistance: Independent Dressing Assistance: Limited assistance     Functional Limitations Info  Sight, Hearing, Speech Sight Info: Adequate Hearing Info: Adequate Speech Info: Adequate    SPECIAL CARE FACTORS FREQUENCY  PT (By licensed PT), OT (By licensed OT)     PT Frequency: 5x a week OT Frequency: 5x a week            Contractures Contractures Info: Not present    Additional Factors Info  Code Status, Allergies Code Status Info: Full Allergies Info: NKA           Current Medications (03/27/2021):  This is the  current hospital active medication list Current Facility-Administered Medications  Medication Dose Route Frequency Provider Last Rate Last Admin   0.9 %  sodium chloride infusion   Intravenous PRN Margaretha Seeds, MD   Paused at 03/26/21 0155   albuterol (PROVENTIL) (2.5 MG/3ML) 0.083% nebulizer solution 2.5 mg  2.5 mg Nebulization Q2H PRN Magdalen Spatz, NP   2.5 mg at 03/27/21 0030   amLODipine (NORVASC) tablet 10 mg  10 mg Oral Daily Shelly Coss, MD   10 mg at 03/27/21 0845   docusate sodium (COLACE) capsule 100 mg  100 mg Oral BID PRN Magdalen Spatz, NP       enoxaparin (LOVENOX) injection 40 mg  40 mg Subcutaneous Daily Candee Furbish, MD   40 mg at 03/27/21 0846   feeding supplement (ENSURE ENLIVE / ENSURE PLUS) liquid 237 mL  237 mL Oral TID BM Candee Furbish, MD   237 mL at 97/58/83 2549   folic acid (FOLVITE) tablet 1 mg  1 mg Oral Daily Aslam, Sadia, MD   1 mg at 03/27/21 0846   hydrALAZINE (APRESOLINE) injection 10-20 mg  10-20 mg Intravenous Q4H PRN Frederik Pear, MD   20 mg at 03/27/21 0020   levETIRAcetam (KEPPRA) tablet 500 mg  500 mg Oral BID Shelly Coss, MD   500 mg at 03/27/21 0846   LORazepam (ATIVAN) tablet 1-4 mg  1-4 mg Oral Q1H PRN  Harvie Heck, MD   1 mg at 03/27/21 1331   Or   LORazepam (ATIVAN) injection 1-4 mg  1-4 mg Intravenous Q1H PRN Aslam, Loralyn Freshwater, MD   2 mg at 03/27/21 0500   metoprolol tartrate (LOPRESSOR) injection 5 mg  5 mg Intravenous Q6H PRN Shelly Coss, MD   5 mg at 03/26/21 1820   metoprolol tartrate (LOPRESSOR) tablet 25 mg  25 mg Oral BID Shelly Coss, MD   25 mg at 03/27/21 0902   multivitamin with minerals tablet 1 tablet  1 tablet Oral Daily Aslam, Loralyn Freshwater, MD   1 tablet at 03/27/21 0847   OLANZapine zydis (ZYPREXA) disintegrating tablet 10 mg  10 mg Sublingual Daily Candee Furbish, MD   10 mg at 03/27/21 0847   ondansetron (ZOFRAN) injection 4 mg  4 mg Intravenous Q6H PRN Magdalen Spatz, NP       PHENObarbital (LUMINAL) injection  65 mg  65 mg Intravenous TID Candee Furbish, MD   65 mg at 03/27/21 0902   polyethylene glycol (MIRALAX / GLYCOLAX) packet 17 g  17 g Oral Daily PRN Magdalen Spatz, NP       thiamine (B-1) 250 mg in sodium chloride 0.9 % 50 mL IVPB  250 mg Intravenous Daily Amie Portland, MD 100 mL/hr at 03/27/21 1334 250 mg at 03/27/21 1334   Followed by   Derrill Memo ON 04/02/2021] thiamine (B-1) injection 100 mg  100 mg Intravenous Daily Amie Portland, MD         Discharge Medications: Please see discharge summary for a list of discharge medications.  Relevant Imaging Results:  Relevant Lab Results:   Additional Information SSS: [id number]; Moderna COVID-19 Vaccine 06/03/2019  Reece Agar, LCSWA

## 2021-03-27 NOTE — Progress Notes (Addendum)
Notified on call provider of chest xray results. No new orders received at this time.   Provider followed up on pt with RN and ordered IV Lasix, as pt is still congested.

## 2021-03-27 NOTE — TOC Initial Note (Signed)
Transition of Care [facility]) - Initial/Assessment Note    Patient Details  Name: Dennis Levine MRN: [id number] Date of Birth: [birth year]  Transition of Care Dennis Levine) CM/SW Contact:    Dennis Levine Phone Number: 03/27/2021, 3:30 PM  Clinical Narrative:                 CSW received SNF consult. CSW met with pt son via phone, pt is currently only oriented to person and son lives in [place], [place]. Pt has a niece listed on chart who lives local and checks in on him daily. CSW introduced self and explained role at the hospital. Pt reports that PTA the pt lived at home alone. PT reports pt walked 60f in last session with a click score of 14 and requires minA.  CSW reviewed PT/OT recommendations for SNF. Pt son reports needing pt to go t a SND bc he does not live in state and can only come to visit. Pt gave CSW permission to fax out to facilities in the area. Pt has no preference of facility at this time. CSW gave pt medicare.gov rating list to review via link. Pt son would like accepted facilities to be sent via text or emailed at [email or web address]. Pt has covid vaccinations and booster.  CSW will continue to follow.    Expected Discharge Plan: Skilled Nursing Facility Barriers to Discharge: Continued Medical Work up   Patient Goals and CMS Choice Patient states their goals for this hospitalization and ongoing recovery are:: Rehab CMS Medicare.gov Compare Post Acute Care list provided to:: Patient Choice offered to / list presented to : Patient  Expected Discharge Plan and Services Expected Discharge Plan: [place]house Referral: Clinical Social Work   Post Acute Care [place]: [place] for the past 2 months: [address]                                     Prior Living Arrangements/Services Living arrangements for the past 2 months: Single Family Home Lives with:: Self Patient language and need for  interpreter reviewed:: Yes Do you feel safe going back to the place where you live?: Yes      Need for Family Participation in Patient Care: Yes (Comment) Care giver support system in place?: Yes (comment)   Criminal Activity/Legal Involvement Pertinent to Current Situation/Hospitalization: No - Comment as needed  Activities of Daily Living      Permission Sought/Granted Permission sought to share information with : Facility CSport and exercise psychologist Family Supports Permission granted to share information with : Yes, Verbal Permission Granted  Share Information with NAME: Dennis Levine(Son)   [phone] Permission granted to share info w AGENCY: SNF  Permission granted to share info w Relationship: VUzziel, Levine(Son)   [phone] Permission granted to share info w Contact Information: VKeene, Levine   [phone] Emotional Assessment Appearance:: Appears stated age Attitude/Demeanor/Rapport: Unable to Assess Affect (typically observed): Unable to Assess Orientation: : Oriented to Self Alcohol / Substance Use: Not Applicable Psych Involvement: No (comment)  Admission diagnosis:  Seizure ([place] [R56.9] Seizures ([place] [R56.9] Prolonged Q-T interval on ECG [R94.31] Altered mental status, unspecified altered mental status type [R41.82] Patient Active Problem List   Diagnosis Date Noted   Protein-calorie malnutrition, severe 03/26/2021   Seizures ([place] 03/23/2021   Prostate cancer ([place] 02/03/2016   PCP:  Pcp, No Pharmacy:   [facility]  [place], [place] [address] Phone: [phone] Fax: [phone]  [facility] ([place], [place] [facility] [address] [place] [address] [place] [address] Phone: [phone] Fax: [phone]     Social Determinants of Health (SDOH) Interventions    Readmission Risk Interventions No flowsheet data  found.

## 2021-03-27 NOTE — Progress Notes (Signed)
HOSPITAL MEDICINE OVERNIGHT EVENT NOTE    Nursing reports that patient has been exhibiting gradually increasing shortness of breath with oxygen requirement, now requiring supplemental oxygen via nasal cannula.  Chest x-ray obtained.  Increasing vascular congestion noted concerning for pulmonary edema.  Recent echocardiogram does reveal evidence of diastolic congestive heart failure.  Will attempt trial dose of 40 mg of intravenous Lasix, monitor for response.  Vernelle Emerald  MD [facility]

## 2021-03-27 NOTE — Progress Notes (Addendum)
PROGRESS NOTE    Dennis Levine  WUJ:[id number] DOB: 03/20/1951 DOA: 03/23/2021 PCP: Pcp, No   Chief Complain: Seizure  Brief Narrative: Patient is a 70 year old male with history of prostate cancer, hepatitis C, hypertension, alcohol use who was found down at home by family on 12/13.  He was observed to be seizing during transfer to ER via EMS.  Code stroke was called.  On presentation he was hypoxic, tachypneic.  Patient was intubated in the ED for concern of ongoing seizure, airway protection, admitted under PCCM service.  Neurology was consulted.  Transferred to [facility] service on 12/16.  Hospital course remarkable for persistent confusion/agitation most likely secondary to alcohol withdrawal.  Important events:  12/13 admitted with seizures after being found down at home. Intubated 12/14 MRI with no infarct/acute intracranial process, but noted dural based mass consistent with meningioma along R parietal convexity 12/14 Extubated   Assessment & Plan:   Principal Problem:   Seizures ([place]) Active Problems:   Protein-calorie malnutrition, severe   New onset seizures: Presented with episode of unresponsiveness, left word gaze, seizures followed by left-sided weakness.  MRI of the brain did not show any acute infarct or intracranial process, no definitive etiology for seizure.  But showed dural based mass along the right bilateral convexity consistent with meningioma.  EEG showed epileptogenicity from the right frontal region but no active seizures.  Neurology was following.  Currently on Keppra.  He needs to follow-up with neurology as an outpatient.  Neurology thought that alcohol could be the etiology for seizure.  Meningioma: Described as above.    He needs to follow-up with neurosurgery as an outpatient.  No signal change in the brain parenchyma adjacent to the meningioma making it less likely the cause for seizure  Acute hypoxic respiratory failure required intubation: Intubated on  12/13 for airway protection.  Extubated on 12/14.  Chest x-ray done on 12/17 showed mild vascular congestion with no focal infiltrate.  Currently on 1 to 2 L of oxygen per minute  Diastolic congestive heart failure: Currently euvolemic.  Echo on 03/24/21 showed EF of 65 to 95%, grade 1 diastolic function.  Hypertension: Takes amlodipine at home.added metoprolol for persistent hypertension, tachycardia.  Monitor blood pressure.  Post ictal state/delirium: Hospital course remarkable for delirium.  Was given Precedex in the ICU.  Monitor mental status.  Confused at times.   On Zyprexa  Hepatitis C/prostate cancer: Management as outpatient.  Alcohol withdrawal/alcohol abuse: Drinks 6 packs of beer every day.  Counseled for cessation.  Continue thiamine and folic acid.  Currently on high-dose thiamine.  Likely in withdrawl. CIWA protocol,also on phenobarbital.  Debility/deconditioning: PT/OT recommending skilled nursing facility on discharge.    Nutrition Problem: Severe Malnutrition Etiology: chronic illness (h/o cancer, hep C)      DVT prophylaxis:Lovenox Code Status: Full Family Communication: Niece on phone on 12/16.Discussed with son on phone on 03/27/21  Patient status:Inpatient  Dispo: The patient is from: Home              Anticipated d/c is to: SNF              Anticipated d/c date is: In next 2-3 days.  Needs to see improvement in the mental status and hemodynamically stability before discharge  Consultants: PCCM, neurology  Procedures: Intubation, EEG  Antimicrobials:  Anti-infectives (From admission, onward)    None       Subjective:  Patient seen and examined at the bedside this morning.  On mild sinus  tachycardia, hypertensive.  Still agitated and was on restraints.  Alert and awake and follows commands.  Not in apparent distress.  Was on 2 L of oxygen per minute   Objective: Vitals:   03/27/21 0103 03/27/21 0239 03/27/21 0342 03/27/21 0539  BP:  (!)  164/81 (!) 159/87 (!) 152/97  Pulse:  (!) 109 (!) 108 85  Resp:   (!) 28 (!) 25  Temp:   99.1 F (37.3 C)   TempSrc:   Axillary   SpO2:  96% 98% 96%  Weight: 65.2 kg     Height:        Intake/Output Summary (Last 24 hours) at 03/27/2021 4315 Last data filed at 03/27/2021 0600 Gross per 24 hour  Intake 442.13 ml  Output 1900 ml  Net -1457.87 ml   Filed Weights   03/25/21 0500 03/26/21 0500 03/27/21 0103  Weight: 63.4 kg 64 kg 65.2 kg    Examination:   General exam: Very deconditioned, chronically ill looking HEENT: PERRL Respiratory system: Diminished air sounds bilaterally Cardiovascular system: Sinus tachycardia Gastrointestinal system: Abdomen is nondistended, soft and nontender. Central nervous system: Alert and awake, told correct year and month, obeys commands Extremities: No edema, no clubbing ,no cyanosis Skin: No rashes, no ulcers,no icterus    Data Reviewed: I have personally reviewed following labs and imaging studies  CBC: Recent Labs  Lab 03/23/21 1107 03/23/21 1110 03/24/21 0455 03/24/21 0621 03/25/21 0232 03/26/21 0150 03/27/21 0224  WBC 7.0  --   --  6.3 8.2 6.9 7.1  NEUTROABS 3.3  --   --   --  5.9  --   --   HGB 14.5   < > 12.6* 11.9* 13.0 13.3 13.6  HCT 47.5   < > 37.0* 37.2* 39.7 41.2 41.8  MCV 93.5  --   --  90.7 87.8 88.8 87.1  PLT 210  --   --  175 176 180 196   < > = values in this interval not displayed.   Basic Metabolic Panel: Recent Labs  Lab 03/23/21 1107 03/23/21 1110 03/23/21 1236 03/23/21 1340 03/24/21 0455 03/24/21 0621 03/25/21 0232 03/26/21 0150 03/27/21 0224  NA 136 138   < >  --  137 134* 136 135 135  K 4.0 3.9   < >  --  3.7 3.7 3.7 3.7 3.7  CL 104 106  --   --   --  103 106 104 102  CO2 19*  --   --   --   --  25 23 21* 26  GLUCOSE 142* 138*  --   --   --  108* 105* 103* 115*  BUN 7* 6*  --   --   --  9 9 13  5*  CREATININE 0.90 0.60*  --   --   --  0.86 0.73 0.82 0.90  CALCIUM 8.8*  --   --   --   --  8.2*  8.7* 8.8* 8.4*  MG  --   --   --  1.8  --  1.7 1.7  --   --   PHOS  --   --   --  3.3  --  3.7  --   --   --    < > = values in this interval not displayed.   GFR: Estimated Creatinine Clearance: 69.4 mL/min (by C-G formula based on SCr of 0.9 mg/dL). Liver Function Tests: Recent Labs  Lab 03/23/21 1107 03/25/21 0232  AST 48* 65*  ALT 32 36  ALKPHOS 69 54  BILITOT 0.6 1.3*  PROT 7.0 6.0*  ALBUMIN 3.5 2.7*   No results for input(s): LIPASE, AMYLASE in the last 168 hours. No results for input(s): AMMONIA in the last 168 hours. Coagulation Profile: Recent Labs  Lab 03/23/21 1107  INR 1.0   Cardiac Enzymes: Recent Labs  Lab 03/23/21 1340  CKTOTAL 166   BNP (last 3 results) No results for input(s): PROBNP in the last 8760 hours. HbA1C: No results for input(s): HGBA1C in the last 72 hours.  CBG: Recent Labs  Lab 03/26/21 1139 03/26/21 1642 03/26/21 2010 03/26/21 2317 03/27/21 0306  GLUCAP 132* 118* 98 111* 113*   Lipid Profile: No results for input(s): CHOL, HDL, LDLCALC, TRIG, CHOLHDL, LDLDIRECT in the last 72 hours.  Thyroid Function Tests: No results for input(s): TSH, T4TOTAL, FREET4, T3FREE, THYROIDAB in the last 72 hours. Anemia Panel: No results for input(s): VITAMINB12, FOLATE, FERRITIN, TIBC, IRON, RETICCTPCT in the last 72 hours. Sepsis Labs: Recent Labs  Lab 03/23/21 1320 03/23/21 1652  LATICACIDVEN 2.0* 1.3    Recent Results (from the past 240 hour(s))  Resp Panel by RT-PCR (Flu A&B, Covid) Nasopharyngeal Swab     Status: None   Collection Time: 03/23/21  4:46 PM   Specimen: Nasopharyngeal Swab; Nasopharyngeal(NP) swabs in vial transport medium  Result Value Ref Range Status   SARS Coronavirus 2 by RT PCR NEGATIVE NEGATIVE Final    Comment: (NOTE) SARS-CoV-2 target nucleic acids are NOT DETECTED.  The SARS-CoV-2 RNA is generally detectable in upper respiratory specimens during the acute phase of infection. The lowest concentration of  SARS-CoV-2 viral copies this assay can detect is 138 copies/mL. A negative result does not preclude SARS-Cov-2 infection and should not be used as the sole basis for treatment or other patient management decisions. A negative result may occur with  improper specimen collection/handling, submission of specimen other than nasopharyngeal swab, presence of viral mutation(s) within the areas targeted by this assay, and inadequate number of viral copies(<138 copies/mL). A negative result must be combined with clinical observations, patient history, and epidemiological information. The expected result is Negative.  Fact Sheet for Patients:  EntrepreneurPulse.com.au  Fact Sheet for Healthcare Providers:  IncredibleEmployment.be  This test is no t yet approved or cleared by the [place] FDA and  has been authorized for detection and/or diagnosis of SARS-CoV-2 by FDA under an Emergency Use Authorization (EUA). This EUA will remain  in effect (meaning this test can be used) for the duration of the COVID-19 declaration under Section 564(b)(1) of the Act, 21 U.S.C.section 360bbb-3(b)(1), unless the authorization is terminated  or revoked sooner.       Influenza A by PCR NEGATIVE NEGATIVE Final   Influenza B by PCR NEGATIVE NEGATIVE Final    Comment: (NOTE) The Xpert Xpress SARS-CoV-2/FLU/RSV plus assay is intended as an aid in the diagnosis of influenza from Nasopharyngeal swab specimens and should not be used as a sole basis for treatment. Nasal washings and aspirates are unacceptable for Xpert Xpress SARS-CoV-2/FLU/RSV testing.  Fact Sheet for Patients: EntrepreneurPulse.com.au  Fact Sheet for Healthcare Providers: IncredibleEmployment.be  This test is not yet approved or cleared by the [place] FDA and has been authorized for detection and/or diagnosis of SARS-CoV-2 by FDA under an Emergency Use  Authorization (EUA). This EUA will remain in effect (meaning this test can be used) for the duration of the COVID-19 declaration under Section 564(b)(1) of the Act, 21 U.S.C. section 360bbb-3(b)(1), unless  the authorization is terminated or revoked.  Performed at [facility], [place] [address] [place], [place] [address]   MRSA Next Gen by PCR, Nasal     Status: None   Collection Time: 03/23/21  8:11 PM   Specimen: Nasal Mucosa; Nasal Swab  Result Value Ref Range Status   MRSA by PCR Next Gen NOT DETECTED NOT DETECTED Final    Comment: (NOTE) The GeneXpert MRSA Assay (FDA approved for NASAL specimens only), is one component of a comprehensive MRSA colonization surveillance program. It is not intended to diagnose MRSA infection nor to guide or monitor treatment for MRSA infections. Test performance is not FDA approved in patients less than 67 years old. Performed at [facility], [place] [address], [place], [place] [address]          Radiology Studies: DG Chest 1 View  Result Date: 03/27/2021 CLINICAL DATA:  Shortness of breath EXAM: PORTABLE CHEST 1 VIEW COMPARISON:  03/24/2021 FINDINGS: Gastric catheter and endotracheal tube have been removed in the interval. Lungs are well aerated bilaterally. Mild central vascular prominence is seen without focal infiltrate or edema. No acute bony abnormality is noted. IMPRESSION: Mild vascular congestion without focal infiltrate. Electronically Signed   By: Inez Catalina M.D.   On: 03/27/2021 01:55        Scheduled Meds:  amLODipine  10 mg Oral Daily   enoxaparin (LOVENOX) injection  40 mg Subcutaneous Daily   feeding supplement  237 mL Oral TID BM   folic acid  1 mg Oral Daily   levETIRAcetam  500 mg Oral BID   metoprolol tartrate  25 mg Oral BID   multivitamin with minerals  1 tablet Oral Daily   OLANZapine zydis  10 mg Sublingual Daily   PHENObarbital  130 mg Intravenous TID   Followed by   PHENObarbital  65 mg  Intravenous TID   [START ON 04/02/2021] thiamine injection  100 mg Intravenous Daily   Continuous Infusions:  sodium chloride Stopped (03/26/21 0155)   thiamine injection       LOS: 4 days    Time spent: 35 mins.More than 50% of that time was spent in counseling and/or coordination of care.      Shelly Coss, MD [facility] P12/17/2022, 7:12 AM

## 2021-03-27 NOTE — Progress Notes (Signed)
Patient's SOB improved after Lasix. Urine output 800cc post Lasix dose, for a total of 1900cc urine output from 1900-0700. Patient with less tachypnea, unlabored breathing. Breath sounds continue to be coarse/congested to upper lobes, but improved from previous assessment.

## 2021-03-27 NOTE — Progress Notes (Signed)
Patient noted to be increasingly tachypenic, labored, and congested. RN orally suctioned patient multiple times and encouraged patient to cough. Upper lungs sound increasingly coarse from prior to assessment, lower diminished. RN placed patient on 2L [place] after desaturation of 82% on RA. Patient noted to have moderate secretions when suctioning. MD paged to make aware and see if MD would like any additional orders. RT called to provide breathing treatment to encourage lung expansion.

## 2021-03-28 DIAGNOSIS — R569 Unspecified convulsions: Secondary | ICD-10-CM | POA: Diagnosis not present

## 2021-03-28 LAB — GLUCOSE, CAPILLARY
Glucose-Capillary: 101 mg/dL — ABNORMAL HIGH (ref 70–99)
Glucose-Capillary: 107 mg/dL — ABNORMAL HIGH (ref 70–99)
Glucose-Capillary: 167 mg/dL — ABNORMAL HIGH (ref 70–99)
Glucose-Capillary: 85 mg/dL (ref 70–99)
Glucose-Capillary: 90 mg/dL (ref 70–99)

## 2021-03-28 LAB — CBC
HCT: 43.7 % (ref 39.0–52.0)
Hemoglobin: 14.3 g/dL (ref 13.0–17.0)
MCH: 28.2 pg (ref 26.0–34.0)
MCHC: 32.7 g/dL (ref 30.0–36.0)
MCV: 86.2 fL (ref 80.0–100.0)
Platelets: 207 10*3/uL (ref 150–400)
RBC: 5.07 MIL/uL (ref 4.22–5.81)
RDW: 13.6 % (ref 11.5–15.5)
WBC: 5.8 10*3/uL (ref 4.0–10.5)
nRBC: 0 % (ref 0.0–0.2)

## 2021-03-28 LAB — BASIC METABOLIC PANEL
Anion gap: 9 (ref 5–15)
BUN: 13 mg/dL (ref 8–23)
CO2: 24 mmol/L (ref 22–32)
Calcium: 9 mg/dL (ref 8.9–10.3)
Chloride: 101 mmol/L (ref 98–111)
Creatinine, Ser: 0.79 mg/dL (ref 0.61–1.24)
GFR, Estimated: >60 mL/min (ref >60)
Glucose, Bld: 108 mg/dL — ABNORMAL HIGH (ref 70–99)
Potassium: 4.1 mmol/L (ref 3.5–5.1)
Sodium: 134 mmol/L — ABNORMAL LOW (ref 135–145)

## 2021-03-28 NOTE — Progress Notes (Addendum)
PROGRESS NOTE    Dennis Levine  OJJ:[id number] DOB: 1951-01-07 DOA: 03/23/2021 PCP: Pcp, No   Chief Complain: Seizure  Brief Narrative: Patient is a 70 year old male with history of prostate cancer, hepatitis C, hypertension, alcohol use who was found down at home by family on 12/13.  He was observed to be seizing during transfer to ER via EMS.  Code stroke was called.  On presentation he was hypoxic, tachypneic.  Patient was intubated in the ED for concern of ongoing seizure, airway protection, admitted under PCCM service.  Neurology was consulted.  Transferred to [facility] service on 12/16.  Hospital course remarkable for persistent confusion/agitation most likely secondary to alcohol withdrawal.  PT/OT recommended skilled nursing facility on discharge.  Important events:  12/13 admitted with seizures after being found down at home. Intubated 12/14 MRI with no infarct/acute intracranial process, but noted dural based mass consistent with meningioma along R parietal convexity 12/14 Extubated   Assessment & Plan:   Principal Problem:   Seizures ([place]) Active Problems:   Protein-calorie malnutrition, severe   New onset seizures: Presented with episode of unresponsiveness, left word gaze, seizures followed by left-sided weakness.  MRI of the brain did not show any acute infarct or intracranial process, no definitive etiology for seizure.  But showed dural based mass along the right bilateral convexity consistent with meningioma.  EEG showed epileptogenicity from the right frontal region but no active seizures.  Neurology was following.  Currently on Keppra.  He needs to follow-up with neurology as an outpatient.  Neurology thought that alcohol could be the etiology for seizure.  Meningioma: Described as above.    He needs to follow-up with neurosurgery as an outpatient.  No signal change in the brain parenchyma adjacent to the meningioma making it less likely the cause for seizure  Acute  hypoxic respiratory failure required intubation: Intubated on 12/13 for airway protection.  Extubated on 12/14.  Chest x-ray done on 12/17 showed mild vascular congestion with no focal infiltrate.  Currently on room air  Diastolic congestive heart failure: Currently euvolemic.  Echo on 03/24/21 showed EF of 65 to 93%, grade 1 diastolic function.  Hypertension: Takes amlodipine at home.added metoprolol for persistent hypertension, tachycardia.  Monitor blood pressure.  Post ictal state/delirium: Hospital course remarkable for delirium.  Was given Precedex in the ICU.  Monitor mental status.  Confused at times.  He was on Zyprexa which will be stopped due to persistent sleepiness/drowsiness  Hepatitis C/prostate cancer: Management as outpatient.  Alcohol withdrawal/alcohol abuse: Drinks 6 packs of beer every day.  Counseled for cessation.  Continue thiamine and folic acid.  Currently on high-dose thiamine.  Likely in withdrawl. CIWA protocol,also on phenobarbital,will stop today.  Debility/deconditioning: PT/OT recommending skilled nursing facility on discharge.    Nutrition Problem: Severe Malnutrition Etiology: chronic illness (h/o cancer, hep C)      DVT prophylaxis:Lovenox Code Status: Full Family Communication: Niece on phone on 12/16.Discussed with son on phone on 03/27/21  Patient status:Inpatient  Dispo: The patient is from: Home              Anticipated d/c is to: SNF              Anticipated d/c date is: In next 2-3 days.  Needs to see improvement in the mental status and hemodynamically stability before discharge  Consultants: PCCM, neurology  Procedures: Intubation, EEG  Antimicrobials:  Anti-infectives (From admission, onward)    None       Subjective:  Patient seen and examined the bedside this morning.  Hemodynamically stable.  Was sleeping when I arrived to the room.  Woke up on calling his name, still appears little confused, drowsy.  We discussed about  removing the restraints.  He knew that he is in the hospital and told the correct month  Objective: Vitals:   03/28/21 0100 03/28/21 0354 03/28/21 0400 03/28/21 0600  BP: 129/81  (!) 164/93 136/86  Pulse: 67  98 92  Resp: 19  19   Temp:   97.8 F (36.6 C)   TempSrc:   Axillary   SpO2: 93%  98% 96%  Weight:  63.1 kg    Height:        Intake/Output Summary (Last 24 hours) at 03/28/2021 0621 Last data filed at 03/28/2021 0000 Gross per 24 hour  Intake 20 ml  Output 1025 ml  Net -1005 ml   Filed Weights   03/26/21 0500 03/27/21 0103 03/28/21 0354  Weight: 64 kg 65.2 kg 63.1 kg    Examination:  General exam: Very deconditioned, chronically ill looking, weak HEENT: PERRL Respiratory system:  no wheezes or crackles  Cardiovascular system: S1 & S2 heard, RRR.  Gastrointestinal system: Abdomen is nondistended, soft and nontender. Central nervous system: Alert and awake.  Oriented to place, tells correct month Extremities: No edema, no clubbing ,no cyanosis Skin: No rashes, no ulcers,no icterus    Data Reviewed: I have personally reviewed following labs and imaging studies  CBC: Recent Labs  Lab 03/23/21 1107 03/23/21 1110 03/24/21 0455 03/24/21 0621 03/25/21 0232 03/26/21 0150 03/27/21 0224  WBC 7.0  --   --  6.3 8.2 6.9 7.1  NEUTROABS 3.3  --   --   --  5.9  --   --   HGB 14.5   < > 12.6* 11.9* 13.0 13.3 13.6  HCT 47.5   < > 37.0* 37.2* 39.7 41.2 41.8  MCV 93.5  --   --  90.7 87.8 88.8 87.1  PLT 210  --   --  175 176 180 196   < > = values in this interval not displayed.   Basic Metabolic Panel: Recent Labs  Lab 03/23/21 1107 03/23/21 1110 03/23/21 1236 03/23/21 1340 03/24/21 0455 03/24/21 0621 03/25/21 0232 03/26/21 0150 03/27/21 0224  NA 136 138   < >  --  137 134* 136 135 135  K 4.0 3.9   < >  --  3.7 3.7 3.7 3.7 3.7  CL 104 106  --   --   --  103 106 104 102  CO2 19*  --   --   --   --  25 23 21* 26  GLUCOSE 142* 138*  --   --   --  108* 105*  103* 115*  BUN 7* 6*  --   --   --  9 9 13  5*  CREATININE 0.90 0.60*  --   --   --  0.86 0.73 0.82 0.90  CALCIUM 8.8*  --   --   --   --  8.2* 8.7* 8.8* 8.4*  MG  --   --   --  1.8  --  1.7 1.7  --   --   PHOS  --   --   --  3.3  --  3.7  --   --   --    < > = values in this interval not displayed.   GFR: Estimated Creatinine Clearance: 67.2 mL/min (by C-G  formula based on SCr of 0.9 mg/dL). Liver Function Tests: Recent Labs  Lab 03/23/21 1107 03/25/21 0232  AST 48* 65*  ALT 32 36  ALKPHOS 69 54  BILITOT 0.6 1.3*  PROT 7.0 6.0*  ALBUMIN 3.5 2.7*   No results for input(s): LIPASE, AMYLASE in the last 168 hours. No results for input(s): AMMONIA in the last 168 hours. Coagulation Profile: Recent Labs  Lab 03/23/21 1107  INR 1.0   Cardiac Enzymes: Recent Labs  Lab 03/23/21 1340  CKTOTAL 166   BNP (last 3 results) No results for input(s): PROBNP in the last 8760 hours. HbA1C: No results for input(s): HGBA1C in the last 72 hours.  CBG: Recent Labs  Lab 03/27/21 0758 03/27/21 1141 03/27/21 1552 03/27/21 1955 03/28/21 0337  GLUCAP 119* 99 108* 115* 85   Lipid Profile: No results for input(s): CHOL, HDL, LDLCALC, TRIG, CHOLHDL, LDLDIRECT in the last 72 hours.  Thyroid Function Tests: No results for input(s): TSH, T4TOTAL, FREET4, T3FREE, THYROIDAB in the last 72 hours. Anemia Panel: No results for input(s): VITAMINB12, FOLATE, FERRITIN, TIBC, IRON, RETICCTPCT in the last 72 hours. Sepsis Labs: Recent Labs  Lab 03/23/21 1320 03/23/21 1652  LATICACIDVEN 2.0* 1.3    Recent Results (from the past 240 hour(s))  Resp Panel by RT-PCR (Flu A&B, Covid) Nasopharyngeal Swab     Status: None   Collection Time: 03/23/21  4:46 PM   Specimen: Nasopharyngeal Swab; Nasopharyngeal(NP) swabs in vial transport medium  Result Value Ref Range Status   SARS Coronavirus 2 by RT PCR NEGATIVE NEGATIVE Final    Comment: (NOTE) SARS-CoV-2 target nucleic acids are NOT  DETECTED.  The SARS-CoV-2 RNA is generally detectable in upper respiratory specimens during the acute phase of infection. The lowest concentration of SARS-CoV-2 viral copies this assay can detect is 138 copies/mL. A negative result does not preclude SARS-Cov-2 infection and should not be used as the sole basis for treatment or other patient management decisions. A negative result may occur with  improper specimen collection/handling, submission of specimen other than nasopharyngeal swab, presence of viral mutation(s) within the areas targeted by this assay, and inadequate number of viral copies(<138 copies/mL). A negative result must be combined with clinical observations, patient history, and epidemiological information. The expected result is Negative.  Fact Sheet for Patients:  EntrepreneurPulse.com.au  Fact Sheet for Healthcare Providers:  IncredibleEmployment.be  This test is no t yet approved or cleared by the [place] FDA and  has been authorized for detection and/or diagnosis of SARS-CoV-2 by FDA under an Emergency Use Authorization (EUA). This EUA will remain  in effect (meaning this test can be used) for the duration of the COVID-19 declaration under Section 564(b)(1) of the Act, 21 U.S.C.section 360bbb-3(b)(1), unless the authorization is terminated  or revoked sooner.       Influenza A by PCR NEGATIVE NEGATIVE Final   Influenza B by PCR NEGATIVE NEGATIVE Final    Comment: (NOTE) The Xpert Xpress SARS-CoV-2/FLU/RSV plus assay is intended as an aid in the diagnosis of influenza from Nasopharyngeal swab specimens and should not be used as a sole basis for treatment. Nasal washings and aspirates are unacceptable for Xpert Xpress SARS-CoV-2/FLU/RSV testing.  Fact Sheet for Patients: EntrepreneurPulse.com.au  Fact Sheet for Healthcare Providers: IncredibleEmployment.be  This test is not yet  approved or cleared by the [place] FDA and has been authorized for detection and/or diagnosis of SARS-CoV-2 by FDA under an Emergency Use Authorization (EUA). This EUA will remain in effect (meaning  this test can be used) for the duration of the COVID-19 declaration under Section 564(b)(1) of the Act, 21 U.S.C. section 360bbb-3(b)(1), unless the authorization is terminated or revoked.  Performed at [facility], [place] [address], [place], [place] [address]   MRSA Next Gen by PCR, Nasal     Status: None   Collection Time: 03/23/21  8:11 PM   Specimen: Nasal Mucosa; Nasal Swab  Result Value Ref Range Status   MRSA by PCR Next Gen NOT DETECTED NOT DETECTED Final    Comment: (NOTE) The GeneXpert MRSA Assay (FDA approved for NASAL specimens only), is one component of a comprehensive MRSA colonization surveillance program. It is not intended to diagnose MRSA infection nor to guide or monitor treatment for MRSA infections. Test performance is not FDA approved in patients less than 46 years old. Performed at [facility], [place] [address] [place], [place] [address]          Radiology Studies: DG Chest 1 View  Result Date: 03/27/2021 CLINICAL DATA:  Shortness of breath EXAM: PORTABLE CHEST 1 VIEW COMPARISON:  03/24/2021 FINDINGS: Gastric catheter and endotracheal tube have been removed in the interval. Lungs are well aerated bilaterally. Mild central vascular prominence is seen without focal infiltrate or edema. No acute bony abnormality is noted. IMPRESSION: Mild vascular congestion without focal infiltrate. Electronically Signed   By: Inez Catalina M.D.   On: 03/27/2021 01:55        Scheduled Meds:  amLODipine  10 mg Oral Daily   enoxaparin (LOVENOX) injection  40 mg Subcutaneous Daily   feeding supplement  237 mL Oral TID BM   folic acid  1 mg Oral Daily   levETIRAcetam  500 mg Oral BID   metoprolol tartrate  25 mg Oral BID   multivitamin with minerals  1  tablet Oral Daily   OLANZapine zydis  10 mg Sublingual Daily   PHENObarbital  65 mg Intravenous TID   [START ON 04/02/2021] thiamine injection  100 mg Intravenous Daily   Continuous Infusions:  sodium chloride Stopped (03/26/21 0155)   thiamine injection 250 mg (03/27/21 1334)     LOS: 5 days    Time spent: 35 mins.More than 50% of that time was spent in counseling and/or coordination of care.      Shelly Coss, MD [facility] P12/18/2022, 6:21 AM

## 2021-03-29 DIAGNOSIS — R569 Unspecified convulsions: Secondary | ICD-10-CM | POA: Diagnosis not present

## 2021-03-29 LAB — GLUCOSE, CAPILLARY
Glucose-Capillary: 83 mg/dL (ref 70–99)
Glucose-Capillary: 86 mg/dL (ref 70–99)
Glucose-Capillary: 121 mg/dL — ABNORMAL HIGH (ref 70–99)
Glucose-Capillary: 118 mg/dL — ABNORMAL HIGH (ref 70–99)
Glucose-Capillary: 137 mg/dL — ABNORMAL HIGH (ref 70–99)
Glucose-Capillary: 127 mg/dL — ABNORMAL HIGH (ref 70–99)

## 2021-03-29 LAB — BASIC METABOLIC PANEL
Anion gap: 7 (ref 5–15)
BUN: 18 mg/dL (ref 8–23)
CO2: 24 mmol/L (ref 22–32)
Calcium: 8.4 mg/dL — ABNORMAL LOW (ref 8.9–10.3)
Chloride: 102 mmol/L (ref 98–111)
Creatinine, Ser: 0.79 mg/dL (ref 0.61–1.24)
GFR, Estimated: >60 mL/min (ref >60)
Glucose, Bld: 89 mg/dL (ref 70–99)
Potassium: 4 mmol/L (ref 3.5–5.1)
Sodium: 133 mmol/L — ABNORMAL LOW (ref 135–145)

## 2021-03-29 LAB — CBC
HCT: 41.9 % (ref 39.0–52.0)
Hemoglobin: 13.6 g/dL (ref 13.0–17.0)
MCH: 28.4 pg (ref 26.0–34.0)
MCHC: 32.5 g/dL (ref 30.0–36.0)
MCV: 87.5 fL (ref 80.0–100.0)
Platelets: 199 10*3/uL (ref 150–400)
RBC: 4.79 MIL/uL (ref 4.22–5.81)
RDW: 13.6 % (ref 11.5–15.5)
WBC: 5.3 10*3/uL (ref 4.0–10.5)
nRBC: 0 % (ref 0.0–0.2)

## 2021-03-29 NOTE — TOC Progression Note (Addendum)
Transition of Care [facility]) - Progression Note    Patient Details  Name: Dennis Levine MRN: [id number] Date of Birth: 1950/12/29  Transition of [facility]) CM/SW [place], [place] Phone Number: 03/29/2021, 1:36 PM  Clinical Narrative:   CSW spoke with son, Legrand Como, to provide bed offers. CSW sent email to Legrand Como to include bed offers. Legrand Como said he had the name of a facility in mind but couldn't think of it, had to look in his phone and would text CSW the name. CSW to follow.  UPDATE: CSW contacted by Legrand Como that the SNF he'd like is [facility]. Referral is still pending. CSW reached out to [facility] to ask them to review, awaiting response. CSW to follow.    Expected Discharge Plan: [place] Barriers to Discharge: Continued Medical Work up  Expected Discharge Plan and Services Expected Discharge Plan: [place] In-house Referral: Clinical Social Work   Post Acute Care Choice: [place] Living arrangements for the past 2 months: Single Family Home                                       Social Determinants of Health (SDOH) Interventions    Readmission Risk Interventions No flowsheet data found.

## 2021-03-29 NOTE — Progress Notes (Signed)
Speech Language Pathology Treatment: Dysphagia  Patient Details Name: Dennis Levine MRN: [id number] DOB: 04-15-1950 Today's Date: 03/29/2021 Time: 4436-0165 SLP Time Calculation (min) (ACUTE ONLY): 11 min  Assessment / Plan / Recommendation Clinical Impression  Pt was seen just after finishing his breakfast meal, with his tray cleared and still working on chewing some of the solid food, but taking his time in doing so and clearing his mouth appropriately. Pt consumed additional trials of solids and thin liquids offered by SLP with oral preparation that seemed functional in light of limited dentition and no overt coughing noted either at baseline or throughout intake. Pt describes his baseline diet, which seems to be most consistent with mechanical soft solids, and he says his preference would be to not have anything more solid than what he is currently getting. Will leave him on Dys 3 diet and thin liquids. SLP to sign off acutely but please reorder with any changes.    HPI HPI: Pt is a 70 year old male who was found down at home by family 12/13 after they heard him collapse in the bathroom. Pt admitted with seizures and intubated. ETT 12/13-12/14. MRI brain negative. 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 PMH: history of prostate cancer, Hepatitis C , and hypertension.      SLP Plan  All goals met      Recommendations for follow up therapy are one component of a multi-disciplinary discharge planning process, led by the attending physician.  Recommendations may be updated based on patient status, additional functional criteria and insurance authorization.    Recommendations  Diet recommendations: Dysphagia 3 (mechanical soft);Thin liquid Liquids provided via: Cup;Straw Medication Administration: Whole meds with liquid Supervision: Patient able to self feed Compensations: Slow rate Postural Changes and/or Swallow Maneuvers: Seated upright 90 degrees                 Oral Care Recommendations: Oral care BID Follow Up Recommendations: No SLP follow up Assistance recommended at discharge: None SLP Visit Diagnosis: Dysphagia, unspecified (R13.10) Plan: All goals met           Osie Bond., M.A. [facility] Pager [phone] Office [phone]  03/29/2021, 11:31 AM

## 2021-03-29 NOTE — Progress Notes (Signed)
PROGRESS NOTE    Dennis BENEKE  ACZ:[id number] DOB: [birth year] DOA: 03/23/2021 PCP: Pcp, No   Chief Complain: Seizure  Brief Narrative: Patient is a 70 year old male with history of prostate cancer, hepatitis C, hypertension, alcohol use who was found down at home by family on 12/13.  He was observed to be seizing during transfer to ER via EMS.  Code stroke was called.  On presentation he was hypoxic, tachypneic.  Patient was intubated in the ED for concern of ongoing seizure, airway protection, admitted under PCCM service.  Neurology was consulted.  Transferred to [facility] service on 12/16.  Hospital course remarkable for persistent confusion/agitation most likely secondary to alcohol withdrawal.  Respiratory status, mental status has improved and currently at baseline.  PT/OT recommended skilled nursing facility on discharge.  Medically stable for discharge to skilled nursing facility as soon as bed is available.  Assessment & Plan:   Principal Problem:   Seizures ([place]) Active Problems:   Protein-calorie malnutrition, severe   New onset seizures: Presented with episode of unresponsiveness, left word gaze, seizures followed by left-sided weakness.  MRI of the brain did not show any acute infarct or intracranial process, no definitive etiology for seizure.  But showed dural based mass along the right bilateral convexity consistent with meningioma.  EEG showed epileptogenicity from the right frontal region but no active seizures.  Neurology was following.  Currently on Keppra.  He needs to follow-up with neurology as an outpatient.  Neurology thought that alcohol could be the etiology for seizure.  Currently alert and oriented  Meningioma: Described as above.    He needs to follow-up with neurosurgery as an outpatient.  No signal change in the brain parenchyma adjacent to the meningioma making it less likely the cause for seizure  Acute hypoxic respiratory failure required intubation: Intubated  on 12/13 for airway protection.  Extubated on 12/14.  Chest x-ray done on 12/17 showed mild vascular congestion with no focal infiltrate.  Currently on room air  Diastolic congestive heart failure: Currently euvolemic.  Echo on 03/24/21 showed EF of 65 to 10%, grade 1 diastolic function.  Hypertension: Takes amlodipine at home.added metoprolol for persistent hypertension, tachycardia.  Monitor blood pressure.Now stable  Post ictal state/delirium: Hospital course remarkable for delirium.  Was given Precedex in the ICU.  He was on Zyprexa which will be stopped due to persistent sleepiness/drowsiness.  Mental status has improved today.  Hepatitis C/prostate cancer: Management as outpatient.  Alcohol withdrawal/alcohol abuse: Drinks 6 packs of beer every day.  Counseled for cessation.  Continue thiamine and folic acid.  Given high-dose thiamine.  He was on CIWA protocol,also on phenobarbital, stopped  Debility/deconditioning: PT/OT recommending skilled nursing facility on discharge.    Nutrition Problem: Severe Malnutrition Etiology: chronic illness (h/o cancer, hep C)      DVT prophylaxis:Lovenox Code Status: Full Family Communication: Niece on phone on 12/16.Discussed with son on phone on 03/27/21  Patient status:Inpatient  Dispo: The patient is from: Home              Anticipated d/c is to: SNF              Anticipated d/c date is:as soon as bed is available  Consultants: PCCM, neurology  Procedures: Intubation, EEG  Antimicrobials:  Anti-infectives (From admission, onward)    None       Subjective:  Patient seen and examined at the bedside this morning.  Hemodynamically stable.  Very comfortable today.  Alert and oriented.  Eating  his breakfast.  Denies any complaints  Objective: Vitals:   03/28/21 2004 03/28/21 2321 03/29/21 0404 03/29/21 0711  BP: 140/81 119/84 (!) 150/78   Pulse: 90 75 72   Resp: 20 20 20    Temp: 99 F (37.2 C) 98 F (36.7 C) 98.5 F (36.9  C)   TempSrc: Oral Oral Oral   SpO2: 95% 96% 95%   Weight:    62.8 kg  Height:        Intake/Output Summary (Last 24 hours) at 03/29/2021 0734 Last data filed at 03/29/2021 0404 Gross per 24 hour  Intake 887 ml  Output 900 ml  Net -13 ml   Filed Weights   03/27/21 0103 03/28/21 0354 03/29/21 0711  Weight: 65.2 kg 63.1 kg 62.8 kg    Examination:  General exam: Overall comfortable, not in distress, deconditioned, weak HEENT: PERRL Respiratory system:  no wheezes or crackles  Cardiovascular system: S1 & S2 heard, RRR.  Gastrointestinal system: Abdomen is nondistended, soft and nontender. Central nervous system: Alert and oriented Extremities: No edema, no clubbing ,no cyanosis Skin: No rashes, no ulcers,no icterus    Data Reviewed: I have personally reviewed following labs and imaging studies  CBC: Recent Labs  Lab 03/23/21 1107 03/23/21 1110 03/25/21 0232 03/26/21 0150 03/27/21 0224 03/28/21 0703 03/29/21 0347  WBC 7.0   < > 8.2 6.9 7.1 5.8 5.3  NEUTROABS 3.3  --  5.9  --   --   --   --   HGB 14.5   < > 13.0 13.3 13.6 14.3 13.6  HCT 47.5   < > 39.7 41.2 41.8 43.7 41.9  MCV 93.5   < > 87.8 88.8 87.1 86.2 87.5  PLT 210   < > 176 180 196 207 199   < > = values in this interval not displayed.   Basic Metabolic Panel: Recent Labs  Lab 03/23/21 1340 03/24/21 0455 03/24/21 0621 03/25/21 0232 03/26/21 0150 03/27/21 0224 03/28/21 0703 03/29/21 0347  NA  --    < > 134* 136 135 135 134* 133*  K  --    < > 3.7 3.7 3.7 3.7 4.1 4.0  CL  --   --  103 106 104 102 101 102  CO2  --   --  25 23 21* 26 24 24   GLUCOSE  --   --  108* 105* 103* 115* 108* 89  BUN  --   --  9 9 13  5* 13 18  CREATININE  --   --  0.86 0.73 0.82 0.90 0.79 0.79  CALCIUM  --   --  8.2* 8.7* 8.8* 8.4* 9.0 8.4*  MG 1.8  --  1.7 1.7  --   --   --   --   PHOS 3.3  --  3.7  --   --   --   --   --    < > = values in this interval not displayed.   GFR: Estimated Creatinine Clearance: 75.2 mL/min  (by C-G formula based on SCr of 0.79 mg/dL). Liver Function Tests: Recent Labs  Lab 03/23/21 1107 03/25/21 0232  AST 48* 65*  ALT 32 36  ALKPHOS 69 54  BILITOT 0.6 1.3*  PROT 7.0 6.0*  ALBUMIN 3.5 2.7*   No results for input(s): LIPASE, AMYLASE in the last 168 hours. No results for input(s): AMMONIA in the last 168 hours. Coagulation Profile: Recent Labs  Lab 03/23/21 1107  INR 1.0   Cardiac Enzymes: Recent Labs  Lab 03/23/21 1340  CKTOTAL 166   BNP (last 3 results) No results for input(s): PROBNP in the last 8760 hours. HbA1C: No results for input(s): HGBA1C in the last 72 hours.  CBG: Recent Labs  Lab 03/28/21 0727 03/28/21 1556 03/28/21 1942 03/28/21 2346 03/29/21 0324  GLUCAP 107* 101* 167* 90 83   Lipid Profile: No results for input(s): CHOL, HDL, LDLCALC, TRIG, CHOLHDL, LDLDIRECT in the last 72 hours.  Thyroid Function Tests: No results for input(s): TSH, T4TOTAL, FREET4, T3FREE, THYROIDAB in the last 72 hours. Anemia Panel: No results for input(s): VITAMINB12, FOLATE, FERRITIN, TIBC, IRON, RETICCTPCT in the last 72 hours. Sepsis Labs: Recent Labs  Lab 03/23/21 1320 03/23/21 1652  LATICACIDVEN 2.0* 1.3    Recent Results (from the past 240 hour(s))  Resp Panel by RT-PCR (Flu A&B, Covid) Nasopharyngeal Swab     Status: None   Collection Time: 03/23/21  4:46 PM   Specimen: Nasopharyngeal Swab; Nasopharyngeal(NP) swabs in vial transport medium  Result Value Ref Range Status   SARS Coronavirus 2 by RT PCR NEGATIVE NEGATIVE Final    Comment: (NOTE) SARS-CoV-2 target nucleic acids are NOT DETECTED.  The SARS-CoV-2 RNA is generally detectable in upper respiratory specimens during the acute phase of infection. The lowest concentration of SARS-CoV-2 viral copies this assay can detect is 138 copies/mL. A negative result does not preclude SARS-Cov-2 infection and should not be used as the sole basis for treatment or other patient management decisions.  A negative result may occur with  improper specimen collection/handling, submission of specimen other than nasopharyngeal swab, presence of viral mutation(s) within the areas targeted by this assay, and inadequate number of viral copies(<138 copies/mL). A negative result must be combined with clinical observations, patient history, and epidemiological information. The expected result is Negative.  Fact Sheet for Patients:  EntrepreneurPulse.com.au  Fact Sheet for Healthcare Providers:  IncredibleEmployment.be  This test is no t yet approved or cleared by the [place] FDA and  has been authorized for detection and/or diagnosis of SARS-CoV-2 by FDA under an Emergency Use Authorization (EUA). This EUA will remain  in effect (meaning this test can be used) for the duration of the COVID-19 declaration under Section 564(b)(1) of the Act, 21 U.S.C.section 360bbb-3(b)(1), unless the authorization is terminated  or revoked sooner.       Influenza A by PCR NEGATIVE NEGATIVE Final   Influenza B by PCR NEGATIVE NEGATIVE Final    Comment: (NOTE) The Xpert Xpress SARS-CoV-2/FLU/RSV plus assay is intended as an aid in the diagnosis of influenza from Nasopharyngeal swab specimens and should not be used as a sole basis for treatment. Nasal washings and aspirates are unacceptable for Xpert Xpress SARS-CoV-2/FLU/RSV testing.  Fact Sheet for Patients: EntrepreneurPulse.com.au  Fact Sheet for Healthcare Providers: IncredibleEmployment.be  This test is not yet approved or cleared by the [place] FDA and has been authorized for detection and/or diagnosis of SARS-CoV-2 by FDA under an Emergency Use Authorization (EUA). This EUA will remain in effect (meaning this test can be used) for the duration of the COVID-19 declaration under Section 564(b)(1) of the Act, 21 U.S.C. section 360bbb-3(b)(1), unless the authorization  is terminated or revoked.  Performed at [facility], [place] [address], [place], [place] [address]   MRSA Next Gen by PCR, Nasal     Status: None   Collection Time: 03/23/21  8:11 PM   Specimen: Nasal Mucosa; Nasal Swab  Result Value Ref Range Status   MRSA by PCR  Next Gen NOT DETECTED NOT DETECTED Final    Comment: (NOTE) The GeneXpert MRSA Assay (FDA approved for NASAL specimens only), is one component of a comprehensive MRSA colonization surveillance program. It is not intended to diagnose MRSA infection nor to guide or monitor treatment for MRSA infections. Test performance is not FDA approved in patients less than 47 years old. Performed at [facility], [place] [address] [place], [place] [address]          Radiology Studies: No results found.      Scheduled Meds:  amLODipine  10 mg Oral Daily   enoxaparin (LOVENOX) injection  40 mg Subcutaneous Daily   feeding supplement  237 mL Oral TID BM   folic acid  1 mg Oral Daily   levETIRAcetam  500 mg Oral BID   metoprolol tartrate  25 mg Oral BID   multivitamin with minerals  1 tablet Oral Daily   [START ON 04/02/2021] thiamine injection  100 mg Intravenous Daily   Continuous Infusions:  sodium chloride Stopped (03/26/21 0155)   thiamine injection 250 mg (03/28/21 0920)     LOS: 6 days    Time spent: 35 mins.More than 50% of that time was spent in counseling and/or coordination of care.      Shelly Coss, MD [facility] P12/19/2022, 7:34 AM

## 2021-03-29 NOTE — Plan of Care (Signed)
  Problem: Education: Goal: Knowledge of General Education information will improve Description Including pain rating scale, medication(s)/side effects and non-pharmacologic comfort measures Outcome: Progressing   Problem: Health Behavior/Discharge Planning: Goal: Ability to manage health-related needs will improve Outcome: Progressing   

## 2021-03-29 NOTE — Care Management Important Message (Signed)
Important Message  Patient Details  Name: Dennis Levine MRN: [id number] Date of Birth: 1950/05/24   Medicare Important Message Given:  Yes     Orbie Pyo 03/29/2021, 3:04 PM

## 2021-03-30 DIAGNOSIS — R569 Unspecified convulsions: Secondary | ICD-10-CM | POA: Diagnosis not present

## 2021-03-30 LAB — GLUCOSE, CAPILLARY
Glucose-Capillary: 107 mg/dL — ABNORMAL HIGH (ref 70–99)
Glucose-Capillary: 122 mg/dL — ABNORMAL HIGH (ref 70–99)
Glucose-Capillary: 204 mg/dL — ABNORMAL HIGH (ref 70–99)
Glucose-Capillary: 96 mg/dL (ref 70–99)
Glucose-Capillary: 98 mg/dL (ref 70–99)
Glucose-Capillary: 99 mg/dL (ref 70–99)

## 2021-03-30 LAB — RESP PANEL BY RT-PCR (FLU A&B, COVID) ARPGX2
Influenza A by PCR: NEGATIVE
Influenza B by PCR: NEGATIVE
SARS Coronavirus 2 by RT PCR: POSITIVE — AB

## 2021-03-30 MED ORDER — POLYETHYLENE GLYCOL 3350 17 G PO PACK: 17.0000 g | PACK | Freq: Every day | ORAL | 0 refills | Status: AC | PRN

## 2021-03-30 MED ORDER — METOPROLOL TARTRATE 25 MG PO TABS: 25.0000 mg | ORAL_TABLET | Freq: Two times a day (BID) | ORAL | Status: DC

## 2021-03-30 MED ORDER — FOLIC ACID 1 MG PO TABS: 1.0000 mg | ORAL_TABLET | Freq: Every day | ORAL | Status: DC

## 2021-03-30 MED ORDER — LEVETIRACETAM 500 MG PO TABS: 500.0000 mg | ORAL_TABLET | Freq: Two times a day (BID) | ORAL | Status: DC

## 2021-03-30 NOTE — Progress Notes (Signed)
Physical Therapy Treatment Patient Details Name: Dennis Levine MRN: [id number] DOB: 1951/02/24 Today's Date: 03/30/2021   History of Present Illness 70 yo admitted 12/13 after found unresponsive in bathroom with witnessed seizure and intubated 12/13-12/14. Head CT (-), MRI demonstrated Rt meningioma. PMhx: ETOH abuse, HTN, hep C    PT Comments    Pt received in supine, pleasantly agreeable to therapy session and with good effort and tolerance for hallway gait progression using RW. Pt needing up to [place] for gait trial due to posterior LOB x2 but otherwise minA. Pt with noted short term memory deficit and possible visual deficit with difficulty recalling familiar objects/decor on tree and difficulty walking in straight line in hallway. Pt continues to benefit from PT services to progress toward functional mobility goals.   Recommendations for follow up therapy are one component of a multi-disciplinary discharge planning process, led by the attending physician.  Recommendations may be updated based on patient status, additional functional criteria and insurance authorization.  Follow Up Recommendations  Skilled nursing-short term rehab (<3 hours/day)     Assistance Recommended at Discharge Frequent or constant Supervision/Assistance  Equipment Recommendations  Rolling walker (2 wheels);BSC/3in1    Recommendations for Other Services       Precautions / Restrictions Precautions Precautions: Fall Precaution Comments: watch HR Restrictions Weight Bearing Restrictions: No     Mobility  Bed Mobility Overal bed mobility: Needs Assistance Bed Mobility: Supine to Sit     Supine to sit: Min guard;HOB elevated     General bed mobility comments: increased time to perform, no physical assist needed, cues for safety    Transfers Overall transfer level: Needs assistance Equipment used: Rolling walker (2 wheels) Transfers: Sit to/from Stand Sit to Stand: Min assist    General  transfer comment: min assist for balance and safe walker use, pt pulling up on RW needs vcs for hand placement each transfer (poor carryover of instruction within session)    Ambulation/Gait Ambulation/Gait assistance: Min assist;Mod assist Gait Distance (Feet): 70 Feet (x2 with prolonged standing break) Assistive device: Rolling walker (2 wheels) Gait Pattern/deviations: Step-through pattern;Decreased stride length;Drifts right/left;Leaning posteriorly       General Gait Details: pt reliant on RW and able to progress gait distance with RW support but consistently drifting to L side of hallway and needing modA x2 episodes due to posterior LOB; pt denies dizziness      Balance Overall balance assessment: Needs assistance Sitting-balance support: Feet supported Sitting balance-Leahy Scale: Fair Sitting balance - Comments: able to maintain sitting balance on EOB   Standing balance support: No upper extremity supported;Single extremity supported Standing balance-Leahy Scale: Poor Standing balance comment: consistent minA and at times modA for support with RW use for dynamic tasks       Cognition Arousal/Alertness: Awake/alert Behavior During Therapy: WFL for tasks assessed/performed Overall Cognitive Status: Impaired/Different from baseline Area of Impairment: Orientation;Memory;Safety/judgement;Awareness;Following commands;Problem solving  Memory: Decreased short-term memory Following Commands: Follows one step commands consistently Safety/Judgement: Decreased awareness of safety;Decreased awareness of deficits Awareness: Emergent Problem Solving: Slow processing;Requires verbal cues General Comments: pt given cognitive task to look for his room number and location of room, needed multiple hints for wayfinding and had difficult identifying names of ornaments (icicle, snowflakes, etc) on christmas tree while taking a standing break in front of tree. Possible visual deficits vs  cognitive deficit?           General Comments General comments (skin integrity, edema, etc.): VSS per chart review, no acute  s/sx distress      Pertinent Vitals/Pain Pain Assessment: No/denies pain     PT Goals (current goals can now be found in the care plan section) Acute Rehab PT Goals Patient Stated Goal: return home, work PT Goal Formulation: With patient Time For Goal Achievement: 04/09/21 Progress towards PT goals: Progressing toward goals    Frequency    Min 3X/week      PT Plan Current plan remains appropriate       AM-PAC PT "6 Clicks" Mobility   Outcome Measure  Help needed turning from your back to your side while in a flat bed without using bedrails?: A Little Help needed moving from lying on your back to sitting on the side of a flat bed without using bedrails?: A Little Help needed moving to and from a bed to a chair (including a wheelchair)?: A Lot (mod cues) Help needed standing up from a chair using your arms (e.g., wheelchair or bedside chair)?: A Little Help needed to walk in hospital room?: A Lot Help needed climbing 3-5 steps with a railing? : Total 6 Click Score: 14    End of Session Equipment Utilized During Treatment: Gait belt Activity Tolerance: Patient tolerated treatment well Patient left: in chair;with call bell/phone within reach;with chair alarm set;Other (comment) (heels floated; RN/NT notified pt needs new condom cath placed and asking about glue for his dentures) Nurse Communication: Mobility status PT Visit Diagnosis: Other abnormalities of gait and mobility (R26.89);Difficulty in walking, not elsewhere classified (R26.2);Muscle weakness (generalized) (M62.81);Unsteadiness on feet (R26.81)     Time: 8675-4492 PT Time Calculation (min) (ACUTE ONLY): 16 min  Charges:  $Gait Training: 8-22 mins                     Jullianna Gabor P., PTA Acute Rehabilitation Services Pager: [phone] Office: [place] 03/30/2021, 3:14 PM

## 2021-03-30 NOTE — Progress Notes (Signed)
Occupational Therapy Treatment Patient Details Name: Dennis Levine MRN: [id number] DOB: 08-30-1950 Today's Date: 03/30/2021   History of present illness 70 yo admitted 12/13 after found unresponsive in bathroom with witnessed seizure and intubated 12/13-12/14. Head CT (-), MRI demonstrated Rt meningioma. PMhx: ETOH abuse, HTN, hep C   OT comments  Patient making good progress with OT treatment today with min guard to get to EOB and min assist to ambulate to sink for self care. Patient stood at sink for grooming and bathing tasks with min assist for balance for safety and verbal cues for sequencing due to perseveration with washing face. Patient performed dressing seated. Acute OT to continue to follow.    Recommendations for follow up therapy are one component of a multi-disciplinary discharge planning process, led by the attending physician.  Recommendations may be updated based on patient status, additional functional criteria and insurance authorization.    Follow Up Recommendations  Skilled nursing-short term rehab (<3 hours/day)    Assistance Recommended at Discharge Frequent or constant Supervision/Assistance  Equipment Recommendations  Tub/shower seat    Recommendations for Other Services      Precautions / Restrictions Precautions Precautions: Fall Precaution Comments: watch HR       Mobility Bed Mobility Overal bed mobility: Needs Assistance Bed Mobility: Supine to Sit     Supine to sit: Min guard;HOB elevated     General bed mobility comments: verbal cues for rail use    Transfers Overall transfer level: Needs assistance Equipment used: Rolling walker (2 wheels) Transfers: Sit to/from Stand Sit to Stand: Min assist           General transfer comment: min assist for balance and safe walker use     Balance Overall balance assessment: Needs assistance Sitting-balance support: Feet supported Sitting balance-Leahy Scale: Fair Sitting balance - Comments:  able to maintain sitting balance on EOB   Standing balance support: No upper extremity supported;Single extremity supported Standing balance-Leahy Scale: Poor Standing balance comment: stood at sink for grooming and bathing with unsteady balance                           ADL either performed or assessed with clinical judgement   ADL Overall ADL's : Needs assistance/impaired     Grooming: Wash/dry hands;Wash/dry face;Oral care;Minimal assistance;Standing Grooming Details (indicate cue type and reason): performed standing at sink with assistance for balance and verbal cues for sequencing Upper Body Bathing: Minimal assistance;Cueing for sequencing;Standing Upper Body Bathing Details (indicate cue type and reason): performed standing at sink Lower Body Bathing: Minimal assistance;Sit to/from stand Lower Body Bathing Details (indicate cue type and reason): performed stanidng at sink Upper Body Dressing : Minimal assistance;Sitting Upper Body Dressing Details (indicate cue type and reason): donned gown                 Functional mobility during ADLs: Minimal assistance;Cueing for safety General ADL Comments: required cues for sequencing due to perseveration on washing face    Extremity/Trunk Assessment Upper Extremity Assessment LUE Deficits / Details: chronic L shoulder dysfunction limiting FF LUE Sensation: WNL LUE Coordination: WNL            Vision       Perception     Praxis      Cognition Arousal/Alertness: Awake/alert Behavior During Therapy: WFL for tasks assessed/performed Overall Cognitive Status: Impaired/Different from baseline Area of Impairment: Orientation;Memory;Safety/judgement;Awareness;Following commands;Problem solving  Orientation Level: Disoriented to;Time;Situation   Memory: Decreased short-term memory Following Commands: Follows one step commands consistently Safety/Judgement: Decreased awareness of  safety;Decreased awareness of deficits   Problem Solving: Slow processing General Comments: believed it was Saturday, knew month and year and place          Exercises     Shoulder Instructions       General Comments      Pertinent Vitals/ Pain       Pain Assessment: No/denies pain  Home Living                                          Prior Functioning/Environment              Frequency  Min 2X/week        Progress Toward Goals  OT Goals(current goals can now be found in the care plan section)  Progress towards OT goals: Progressing toward goals  Acute Rehab OT Goals Patient Stated Goal: get better OT Goal Formulation: With patient Time For Goal Achievement: 04/09/21 Potential to Achieve Goals: Good ADL Goals Pt Will Perform Grooming: with supervision;standing Pt Will Perform Lower Body Bathing: with supervision;sit to/from stand Pt Will Perform Lower Body Dressing: with supervision;sit to/from stand Pt Will Transfer to Toilet: ambulating;with supervision Pt Will Perform Toileting - Clothing Manipulation and hygiene: with supervision;sit to/from stand  Plan Discharge plan remains appropriate    Co-evaluation                 AM-PAC OT "6 Clicks" Daily Activity     Outcome Measure   Help from another person eating meals?: A Little Help from another person taking care of personal grooming?: A Little Help from another person toileting, which includes using toliet, bedpan, or urinal?: A Lot Help from another person bathing (including washing, rinsing, drying)?: A Lot Help from another person to put on and taking off regular upper body clothing?: A Little Help from another person to put on and taking off regular lower body clothing?: A Lot 6 Click Score: 15    End of Session Equipment Utilized During Treatment: Gait belt;Rolling walker (2 wheels)  OT Visit Diagnosis: Unsteadiness on feet (R26.81);Other symptoms and signs  involving cognitive function   Activity Tolerance Patient tolerated treatment well   Patient Left in chair;with call bell/phone within reach;with chair alarm set   Nurse Communication Mobility status        Time: 6314-9702 OT Time Calculation (min): 35 min  Charges: OT General Charges $OT Visit: 1 Visit OT Treatments $Self Care/Home Management : 23-37 mins  Lodema Hong, [place]  Pager [phone] Office [place] 03/30/2021, 8:57 AM

## 2021-03-30 NOTE — TOC Progression Note (Signed)
Transition of Care [facility]) - Progression Note    Patient Details  Name: Dennis Levine MRN: [id number] Date of Birth: 1950-12-13  Transition of Care [facility]) CM/SW [place], [place] Phone Number: 03/30/2021, 3:49 PM  Clinical Narrative:   CSW spoke with [facility], confirmed bed offer and availability. CSW sent request to initiate insurance authorization. CSW to follow.    Expected Discharge Plan: [place] Barriers to Discharge: Continued Medical Work up  Expected Discharge Plan and Services Expected Discharge Plan: [place] In-house Referral: Clinical Social Work   Post Acute Care Choice: [place] Living arrangements for the past 2 months: Single Family Home                                       Social Determinants of Health (SDOH) Interventions    Readmission Risk Interventions No flowsheet data found.

## 2021-03-30 NOTE — Discharge Summary (Signed)
Physician Discharge Summary  Dennis Levine BVQ:[id number] DOB: 1950-11-25 DOA: 03/23/2021  PCP: Merryl Hacker, No  Admit date: 03/23/2021 Discharge date: 03/30/2021  Admitted From: Home Disposition:  SNF  Discharge Condition:Stable CODE STATUS:FULL Diet recommendation:  Dysphagia 3  Brief/Interim Summary:  Patient is a 70 year old male with history of prostate cancer, hepatitis C, hypertension, alcohol use who was found down at home by family on 12/13.  He was observed to be seizing during transfer to ER via EMS.  Code stroke was called.  On presentation he was hypoxic, tachypneic.  Patient was intubated in the ED for concern of ongoing seizure, airway protection, admitted under PCCM service.  Neurology was consulted.  Transferred to [facility] service on 12/16.  Hospital course remarkable for persistent confusion/agitation most likely secondary to alcohol withdrawal.  Respiratory status, mental status has improved and currently at baseline.  PT/OT recommended skilled nursing facility on discharge.  Medically stable for discharge to skilled nursing facility as soon as bed is available.  Following problems were addressed during his hospitalization:    New onset seizures: Presented with episode of unresponsiveness, left word gaze, seizures followed by left-sided weakness.  MRI of the brain did not show any acute infarct or intracranial process, no definitive etiology for seizure.  But showed dural based mass along the right bilateral convexity consistent with meningioma.  EEG showed epileptogenicity from the right frontal region but no active seizures.  Neurology was following.  Currently on Keppra.    Neurology thought that alcohol could be the etiology for seizure.  Currently alert and oriented   Meningioma: Described as above.    He needs to follow-up with neurosurgery as an outpatient.  No signal change in the brain parenchyma adjacent to the meningioma making it less likely the cause for seizure   Acute  hypoxic respiratory failure required intubation: Intubated on 12/13 for airway protection.  Extubated on 12/14.  Chest x-ray done on 12/17 showed mild vascular congestion with no focal infiltrate.  Currently on room air   Diastolic congestive heart failure: Currently euvolemic.  Echo on 03/24/21 showed EF of 65 to 80%, grade 1 diastolic function.   Hypertension: Takes amlodipine at home.added metoprolol for persistent hypertension, tachycardia.  BP now stable   Post ictal state/delirium: Hospital course remarkable for delirium.  Was given Precedex in the ICU.  He was on Zyprexa which was stopped due to persistent sleepiness/drowsiness. .   Hepatitis C/prostate cancer: Management as outpatient.   Alcohol withdrawal/alcohol abuse: Drinks 6 packs of beer every day.  Counseled for cessation.  Continue thiamine and folic acid.  Given high-dose thiamine.  He was on CIWA protocol,also on phenobarbital, stopped   Debility/deconditioning: PT/OT recommending skilled nursing facility on discharge.       Discharge Diagnoses:  Principal Problem:   Seizures ([place]) Active Problems:   Protein-calorie malnutrition, severe    Discharge Instructions  Discharge Instructions     Diet general   Complete by: As directed    Dysphagia 3 diet   Discharge instructions   Complete by: As directed    1)Please take prescribed medication as instructed 2)Quit alcohol and smoking 3)Follow up with neurosurgery as an outpatient for the evaluation of your meningioma.  Name and number of the provider has been attached.  Call for appointment   Increase activity slowly   Complete by: As directed       Allergies as of 03/30/2021   No Known Allergies      Medication List  STOP taking these medications    docusate sodium 100 MG capsule Commonly known as: COLACE   oxyCODONE 5 MG immediate release tablet Commonly known as: Oxy IR/ROXICODONE   sulfamethoxazole-trimethoprim 800-160 MG tablet Commonly  known as: BACTRIM DS       TAKE these medications    amLODipine 10 MG tablet Commonly known as: NORVASC Take 1 tablet (10 mg total) by mouth daily.   folic acid 1 MG tablet Commonly known as: FOLVITE Take 1 tablet (1 mg total) by mouth daily. Start taking on: March 31, 2021   levETIRAcetam 500 MG tablet Commonly known as: KEPPRA Take 1 tablet (500 mg total) by mouth 2 (two) times daily.   metoprolol tartrate 25 MG tablet Commonly known as: LOPRESSOR Take 1 tablet (25 mg total) by mouth 2 (two) times daily.   polyethylene glycol 17 g packet Commonly known as: MIRALAX / GLYCOLAX Take 17 g by mouth daily as needed for moderate constipation.        Follow-up Information     Consuella Lose, MD. Schedule an appointment as soon as possible for a visit in 1 week(s).   Specialty: Neurosurgery Contact information: [address] [place] [address] [phone]                No Known Allergies  Consultations: PCCM ,neurology   Procedures/Studies: DG Chest 1 View  Result Date: 03/27/2021 CLINICAL DATA:  Shortness of breath EXAM: PORTABLE CHEST 1 VIEW COMPARISON:  03/24/2021 FINDINGS: Gastric catheter and endotracheal tube have been removed in the interval. Lungs are well aerated bilaterally. Mild central vascular prominence is seen without focal infiltrate or edema. No acute bony abnormality is noted. IMPRESSION: Mild vascular congestion without focal infiltrate. Electronically Signed   By: Inez Catalina M.D.   On: 03/27/2021 01:55   DG Abd 1 View  Result Date: 03/24/2021 CLINICAL DATA:  NG placement.  Seizure. EXAM: ABDOMEN - 1 VIEW; PORTABLE CHEST - 1 VIEW COMPARISON:  Chest radiograph dated 03/23/2021. FINDINGS: Endotracheal tube with tip approximately 5 cm above the carina. Enteric tube with tip in the distal stomach. Probable small left pleural effusion. There is left lung base atelectasis or infiltrate. The right lung is clear. No  pneumothorax. Top-normal cardiac size. No acute osseous pathology. IMPRESSION: 1. Endotracheal tube above the carina. Enteric tube with tip in the distal stomach. 2. Left lung base atelectasis or infiltrate. Electronically Signed   By: Anner Crete M.D.   On: 03/24/2021 01:54   MR BRAIN W WO CONTRAST  Result Date: 03/24/2021 CLINICAL DATA:  Seizure, new onset, no history of trauma EXAM: MRI HEAD WITHOUT AND WITH CONTRAST TECHNIQUE: Multiplanar, multiecho pulse sequences of the brain and surrounding structures were obtained without and with intravenous contrast. CONTRAST:  29mL GADAVIST GADOBUTROL 1 MMOL/ML IV SOLN COMPARISON:  No prior MRI, correlation is made with CT head and CTA head neck 03/23/2021 FINDINGS: Brain: No restricted diffusion to suggest acute or subacute infarction. No acute hemorrhage, intraparenchymal mass, mass effect, or midline shift. No abnormal parenchymal enhancement. Redemonstrated extra-axial, dural-based mass along the right parietal convexity, measuring up to 1.6 x 2.2 x 0.9 cm (AP x TR x CC) (series 25, image 10 and series 24, image 8). No signal abnormality in the underlying right parietal lobe to suggest significant mass effect. No hydrocephalus or extra-axial collection. Scattered T2 hyperintense signal in the periventricular white matter, likely the sequela of mild chronic small vessel ischemic disease. Lacunar infarcts in the left basal  ganglia. General cerebral volume loss, which is somewhat advanced for age. No signal abnormality in the bilateral hippocampi, which demonstrate atrophy, right greater than left, with widening of the temporal horns. No heterotopia or evidence of cortical dysgenesis. Vascular: Normal flow voids. Please see CTA head neck for more detailed vascular findings. Skull and upper cervical spine: Normal marrow signal. Sinuses/Orbits: Mucosal thickening in the ethmoid air cells. The orbits are unremarkable. Other: Trace fluid in the right mastoid air  cells. IMPRESSION: 1. No infarct or other acute intracranial process. 2. No seizure etiology identified. There is atrophy in the right greater than left hippocampus, with dilatation of the right greater than left temporal horn. 3. Dural based mass along the right parietal convexity, without signal abnormality in the underlying right parietal lobe, consistent with a meningioma. Electronically Signed   By: Merilyn Baba M.D.   On: 03/24/2021 01:26   DG Chest Port 1 View  Result Date: 03/24/2021 CLINICAL DATA:  NG placement.  Seizure. EXAM: ABDOMEN - 1 VIEW; PORTABLE CHEST - 1 VIEW COMPARISON:  Chest radiograph dated 03/23/2021. FINDINGS: Endotracheal tube with tip approximately 5 cm above the carina. Enteric tube with tip in the distal stomach. Probable small left pleural effusion. There is left lung base atelectasis or infiltrate. The right lung is clear. No pneumothorax. Top-normal cardiac size. No acute osseous pathology. IMPRESSION: 1. Endotracheal tube above the carina. Enteric tube with tip in the distal stomach. 2. Left lung base atelectasis or infiltrate. Electronically Signed   By: Anner Crete M.D.   On: 03/24/2021 01:54   DG Chest Port 1 View  Result Date: 03/23/2021 CLINICAL DATA:  Intubation EXAM: PORTABLE CHEST 1 VIEW COMPARISON:  10/07/2020 FINDINGS: Endotracheal tube 5 cm above the carina. Normal heart size and vascularity. Bibasilar atelectasis worse on the left. Negative for edema, effusion or pneumothorax. Aorta is atherosclerotic. IMPRESSION: Endotracheal tube 5 cm above the carina. Lower lung volumes with bibasilar atelectasis, worse on the left. Electronically Signed   By: Jerilynn Mages.  Shick M.D.   On: 03/23/2021 12:22   EEG adult  Result Date: 03/23/2021 Lora Havens, MD     03/23/2021  2:24 PM Patient Name: Dennis Levine MRN: [id number] Epilepsy Attending: Lora Havens Referring Physician/Provider: Dr Amie [place] Date: 03/23/2021 Duration: 24.29 mins Patient history:   70 year old being brought in for evaluation for leftward gaze, seizures followed by left-sided weakness. EEG to evaluate for seizure Level of alertness: Asleep AEDs during EEG study: LEV, ativan, propofol Technical aspects: This EEG study was done with scalp electrodes positioned according to the 10-20 International system of electrode placement. Electrical activity was acquired at a sampling rate of 500Hz  and reviewed with a high frequency filter of 70Hz  and a low frequency filter of 1Hz . EEG data were recorded continuously and digitally stored. Description: EEG showed continuous generalized low amplitude 3-6 theta-delta slowing admixed with 15 to 18 Hz generalized beta activity predominantly in bilateral frontocentral regions. Frequent spikes were noted in right frontal region. Hyperventilation and photic stimulation were not performed.   ABNORMALITY -Spike, right frontal region -Continuous slow, generalized IMPRESSION: This study showed evidence of epileptogenicity arising from right frontal region. Additionally there is severe diffuse encephalopathy, nonspecific etiology but most likely due to sedation. No seizures were seen throughout the recording. Dr Rory Percy was notified. Priyanka [name]   Overnight EEG with video  Result Date: 03/24/2021 Lora Havens, MD     03/25/2021  9:33 AM Patient Name: Dennis Levine MRN: [id number] Epilepsy  Attending: Lora Havens Referring Physician/Provider: Dr Amie Portland Duration: 03/23/2021 1325 to [place] 03/24/2021 0810 to 03/25/2021 0300  Patient history:  70 year old being brought in for evaluation for leftward gaze, seizures followed by left-sided weakness. EEG to evaluate for seizure  Level of alertness: lethargic  AEDs during EEG study: LEV, ativan, propofol  Technical aspects: This EEG study was done with scalp electrodes positioned according to the 10-20 International system of electrode placement. Electrical activity was acquired at a sampling rate of 500Hz   and reviewed with a high frequency filter of 70Hz  and a low frequency filter of 1Hz . EEG data were recorded continuously and digitally stored.  Description: EEG showed continuous generalized low amplitude 3-6 theta-delta slowing admixed with 15 to 18 Hz generalized beta activity predominantly in bilateral frontocentral regions. Frequent spikes were noted in right frontal region. Hyperventilation and photic stimulation were not performed.   Of note, study was technically difficult due to significant myogenic artifact.  Additionally, machine was unplugged at [place] on 04/09/2024 during patient transferred and not replugged until 0810 on 03/24/2021.  ABNORMALITY -Spike, right frontal region -Continuous slow, generalized  IMPRESSION: This study showed evidence of epileptogenicity arising from right frontal region. Additionally there is severe diffuse encephalopathy, nonspecific etiology but most likely due to sedation. No seizures were seen throughout the recording.  Lora Havens   ECHOCARDIOGRAM COMPLETE  Result Date: 03/24/2021    ECHOCARDIOGRAM REPORT   Patient Name:   Dennis Levine Date of Exam: 03/24/2021 Medical Rec #:  [id number]       Height:       70.0 in Accession #:    [id number]      Weight:       148.4 lb Date of Birth:  1950-04-24       BSA:          1.839 m Patient Age:    42 years        BP:           125/76 mmHg Patient Gender: M               HR:           79 bpm. Exam Location:  Inpatient Procedure: 2D Echo, Cardiac Doppler and Color Doppler Indications:    Dyspnea R06.00  History:        Patient has no prior history of Echocardiogram examinations.                 Risk Factors:Hypertension.  Sonographer:    Darlina Sicilian RDCS Referring Phys: [place]  1. Left ventricular ejection fraction, by estimation, is 65 to 70%. The left ventricle has normal function. The left ventricle has no regional wall motion abnormalities. Left ventricular diastolic parameters are consistent  with Grade I diastolic dysfunction (impaired relaxation).  2. Right ventricular systolic function is normal. The right ventricular size is normal. There is normal pulmonary artery systolic pressure.  3. The mitral valve is grossly normal. No evidence of mitral valve regurgitation. No evidence of mitral stenosis.  4. The aortic valve was not well visualized. Aortic valve regurgitation is not visualized. No aortic stenosis is present.  5. The inferior vena cava is dilated in size with >50% respiratory variability, suggesting right atrial pressure of 8 mmHg. Comparison(s): No prior Echocardiogram. FINDINGS  Left Ventricle: Left ventricular ejection fraction, by estimation, is 65 to 70%. The left ventricle has normal function. The left ventricle has no regional wall motion abnormalities. The  left ventricular internal cavity size was normal in size. There is  no left ventricular hypertrophy. Left ventricular diastolic parameters are consistent with Grade I diastolic dysfunction (impaired relaxation). Right Ventricle: The right ventricular size is normal. No increase in right ventricular wall thickness. Right ventricular systolic function is normal. There is normal pulmonary artery systolic pressure. The tricuspid regurgitant velocity is 2.26 m/s, and  with an assumed right atrial pressure of 8 mmHg, the estimated right ventricular systolic pressure is 38.1 mmHg. Left Atrium: Left atrial size was normal in size. Right Atrium: Right atrial size was normal in size. Pericardium: There is no evidence of pericardial effusion. Mitral Valve: The mitral valve is grossly normal. No evidence of mitral valve regurgitation. No evidence of mitral valve stenosis. Tricuspid Valve: The tricuspid valve is normal in structure. Tricuspid valve regurgitation is mild . No evidence of tricuspid stenosis. Aortic Valve: The aortic valve was not well visualized. Aortic valve regurgitation is not visualized. No aortic stenosis is present. Pulmonic  Valve: The pulmonic valve was not well visualized. Pulmonic valve regurgitation is trivial. No evidence of pulmonic stenosis. Aorta: The aortic root and ascending aorta are structurally normal, with no evidence of dilitation. Venous: The inferior vena cava is dilated in size with greater than 50% respiratory variability, suggesting right atrial pressure of 8 mmHg. IAS/Shunts: The atrial septum is grossly normal.  LEFT VENTRICLE PLAX 2D LVIDd:         4.30 cm   Diastology LVIDs:         2.30 cm   LV e' medial:    4.86 cm/s LV PW:         1.00 cm   LV E/e' medial:  11.3 LV IVS:        1.00 cm   LV e' lateral:   6.04 cm/s LVOT diam:     1.90 cm   LV E/e' lateral: 9.1 LV SV:         45 LV SV Index:   25 LVOT Area:     2.84 cm  RIGHT VENTRICLE RV S prime:     11.30 cm/s TAPSE (M-mode): 2.3 cm LEFT ATRIUM           Index        RIGHT ATRIUM          Index LA diam:      1.90 cm 1.03 cm/m   RA Area:     8.96 cm LA Vol (A2C): 12.3 ml 6.69 ml/m   RA Volume:   15.00 ml 8.16 ml/m LA Vol (A4C): 19.7 ml 10.72 ml/m  AORTIC VALVE LVOT Vmax:   71.60 cm/s LVOT Vmean:  50.600 cm/s LVOT VTI:    0.159 m  AORTA Ao Root diam: 3.10 cm MITRAL VALVE               TRICUSPID VALVE MV Area (PHT): 3.60 cm    TR Peak grad:   20.4 mmHg MV Decel Time: 211 msec    TR Vmax:        226.00 cm/s MV E velocity: 54.85 cm/s MV A velocity: 70.05 cm/s  SHUNTS MV E/A ratio:  0.78        Systemic VTI:  0.16 m                            Systemic Diam: 1.90 cm Rudean Haskell MD Electronically signed by Rudean Haskell MD Signature Date/Time: 03/24/2021/3:14:44 PM  Final    CT HEAD CODE STROKE WO CONTRAST  Result Date: 03/23/2021 CLINICAL DATA:  Code stroke.  Old deficit, acute, stroke suspected EXAM: CT HEAD WITHOUT CONTRAST TECHNIQUE: Contiguous axial images were obtained from the base of the skull through the vertex without intravenous contrast. COMPARISON:  10/07/2020 FINDINGS: Brain: Generalized atrophy. No sign of acute infarction,  mass lesion, hemorrhage, hydrocephalus or extra-axial collection. Mild chronic small-vessel ischemic change of the white matter. Old lacunar infarction left caudate. Vascular: There is atherosclerotic calcification of the major vessels at the base of the brain. Skull: Negative Sinuses/Orbits: Clear/normal Other: Right forehead scalp lipoma. ASPECTS [facility] Stroke Program Early CT Score) - Ganglionic level infarction (caudate, lentiform nuclei, internal capsule, insula, M1-M3 cortex): 7 - Supraganglionic infarction (M4-M6 cortex): 3 Total score (0-10 with 10 being normal): 10 IMPRESSION: 1. No acute CT finding. Atrophy. Chronic small-vessel ischemic changes of the white matter. Old left caudate lacunar infarction. 2. ASPECTS is 10 3. These results were communicated to Dr. Rory Percy at 11:19 am on 03/23/2021 by text page via the [facility] messaging system. Electronically Signed   By: Nelson Chimes M.D.   On: 03/23/2021 11:20   CT ANGIO HEAD NECK W WO CM (CODE STROKE)  Result Date: 03/23/2021 CLINICAL DATA:  Stroke. Follow-up. Neurological deficit, acute, stroke suspected. EXAM: CT ANGIOGRAPHY HEAD AND NECK TECHNIQUE: Multidetector CT imaging of the head and neck was performed using the standard protocol during bolus administration of intravenous contrast. Multiplanar CT image reconstructions and MIPs were obtained to evaluate the vascular anatomy. Carotid stenosis measurements (when applicable) are obtained utilizing NASCET criteria, using the distal internal carotid diameter as the denominator. CONTRAST:  93mL OMNIPAQUE IOHEXOL 350 MG/ML SOLN COMPARISON:  Head CT earlier same day FINDINGS: CTA NECK FINDINGS Aortic arch: Aortic arch is normal. Branching pattern is normal without origin stenosis. Right carotid system: Common carotid artery is widely patent to the bifurcation. There is soft and calcified plaque at the carotid bifurcation and ICA bulb. There is no stenosis. Cervical ICA is patent to the skull base. Left  carotid system: Common carotid artery is patent to the bifurcation with scattered soft plaque but no stenosis. Carotid bifurcation and ICA bulb show calcified more than soft plaque. No stenosis. Cervical ICA is patent beyond that. Vertebral arteries: Calcified plaque at both vertebral artery origins. The right vertebral artery is dominant. No stenosis at the right vertebral artery origin. Severe stenosis of the non dominant left vertebral artery origin. Both vessels patent beyond that through the cervical region to the foramen magnum. Skeleton: Scoliotic curvature convex to the right. Mid cervical spondylosis. Other neck: No mass or lymphadenopathy. Nasal intubation with the tip in the oropharynx. Upper chest: Lung apices are clear. Review of the MIP images confirms the above findings CTA HEAD FINDINGS Anterior circulation: Both internal carotid arteries are widely patent through the skull base. There is atherosclerotic calcification throughout both carotid siphon regions with stenosis estimated at 50% on both sides. The anterior and middle cerebral vessels are patent on both sides. There is moderate stenosis of the left middle cerebral artery inferior division M2 branch. Distal branch vessels do show some atherosclerotic irregularity. No evidence of large vessel occlusion or correctable proximal stenosis. Posterior circulation: Both vertebral arteries are patent through the foramen magnum. The left terminates in PICA. Dominant right vertebral artery supplies the basilar. Superior cerebellar and posterior cerebral arteries show flow. There is atherosclerotic irregularity in the more distal PCA branches. Venous sinuses: Patent and normal. Anatomic variants: None  significant. 15 mm enhancing meningioma in the right posterior parietal region without significant mass-effect upon the brain. Review of the MIP images confirms the above findings IMPRESSION: No large vessel occlusion. Atherosclerotic change at both carotid  bifurcations without stenosis. Severe stenosis of the non dominant left vertebral artery origin. Left vertebral artery terminates in PICA. Right vertebral artery widely patent. Atherosclerotic disease in both carotid siphon regions with stenosis estimated at 50% on both sides. Moderate stenosis of the left middle cerebral artery inferior division M2 branch. Atherosclerotic narrowing and irregularity within the more distal branch vessels of the anterior, middle and posterior cerebral artery territories. 15 mm right parietal meningioma without significant mass-effect upon the brain. Electronically Signed   By: Nelson Chimes M.D.   On: 03/23/2021 11:40      Subjective: Patient seen and examined at the bedside this morning.  Hemodynamically stable for discharge when bed is available at SNF  Discharge Exam: Vitals:   03/30/21 0816 03/30/21 0954  BP: (!) 119/95 119/73  Pulse: 83 90  Resp: 19   Temp: 99.3 F (37.4 C)   SpO2: 99%    Vitals:   03/29/21 2334 03/30/21 0325 03/30/21 0816 03/30/21 0954  BP: (!) 105/57 132/74 (!) 119/95 119/73  Pulse: 83 71 83 90  Resp: 20 20 19    Temp: 98.4 F (36.9 C) 97.9 F (36.6 C) 99.3 F (37.4 C)   TempSrc: Oral Oral Oral   SpO2: 100% 99% 99%   Weight:  62.3 kg    Height:        General: Pt is alert, awake, not in acute distress Cardiovascular: RRR, S1/S2 +, no rubs, no gallops Respiratory: CTA bilaterally, no wheezing, no rhonchi Abdominal: Soft, NT, ND, bowel sounds + Extremities: no edema, no cyanosis    The results of significant diagnostics from this hospitalization (including imaging, microbiology, ancillary and laboratory) are listed below for reference.     Microbiology: Recent Results (from the past 240 hour(s))  Resp Panel by RT-PCR (Flu A&B, Covid) Nasopharyngeal Swab     Status: None   Collection Time: 03/23/21  4:46 PM   Specimen: Nasopharyngeal Swab; Nasopharyngeal(NP) swabs in vial transport medium  Result Value Ref Range Status    SARS Coronavirus 2 by RT PCR NEGATIVE NEGATIVE Final    Comment: (NOTE) SARS-CoV-2 target nucleic acids are NOT DETECTED.  The SARS-CoV-2 RNA is generally detectable in upper respiratory specimens during the acute phase of infection. The lowest concentration of SARS-CoV-2 viral copies this assay can detect is 138 copies/mL. A negative result does not preclude SARS-Cov-2 infection and should not be used as the sole basis for treatment or other patient management decisions. A negative result may occur with  improper specimen collection/handling, submission of specimen other than nasopharyngeal swab, presence of viral mutation(s) within the areas targeted by this assay, and inadequate number of viral copies(<138 copies/mL). A negative result must be combined with clinical observations, patient history, and epidemiological information. The expected result is Negative.  Fact Sheet for Patients:  EntrepreneurPulse.com.au  Fact Sheet for Healthcare Providers:  IncredibleEmployment.be  This test is no t yet approved or cleared by the [place] FDA and  has been authorized for detection and/or diagnosis of SARS-CoV-2 by FDA under an Emergency Use Authorization (EUA). This EUA will remain  in effect (meaning this test can be used) for the duration of the COVID-19 declaration under Section 564(b)(1) of the Act, 21 U.S.C.section 360bbb-3(b)(1), unless the authorization is terminated  or revoked sooner.  Influenza A by PCR NEGATIVE NEGATIVE Final   Influenza B by PCR NEGATIVE NEGATIVE Final    Comment: (NOTE) The Xpert Xpress SARS-CoV-2/FLU/RSV plus assay is intended as an aid in the diagnosis of influenza from Nasopharyngeal swab specimens and should not be used as a sole basis for treatment. Nasal washings and aspirates are unacceptable for Xpert Xpress SARS-CoV-2/FLU/RSV testing.  Fact Sheet for  Patients: EntrepreneurPulse.com.au  Fact Sheet for Healthcare Providers: IncredibleEmployment.be  This test is not yet approved or cleared by the [place] FDA and has been authorized for detection and/or diagnosis of SARS-CoV-2 by FDA under an Emergency Use Authorization (EUA). This EUA will remain in effect (meaning this test can be used) for the duration of the COVID-19 declaration under Section 564(b)(1) of the Act, 21 U.S.C. section 360bbb-3(b)(1), unless the authorization is terminated or revoked.  Performed at [facility], [place] [address], [place], [place] [address]   MRSA Next Gen by PCR, Nasal     Status: None   Collection Time: 03/23/21  8:11 PM   Specimen: Nasal Mucosa; Nasal Swab  Result Value Ref Range Status   MRSA by PCR Next Gen NOT DETECTED NOT DETECTED Final    Comment: (NOTE) The GeneXpert MRSA Assay (FDA approved for NASAL specimens only), is one component of a comprehensive MRSA colonization surveillance program. It is not intended to diagnose MRSA infection nor to guide or monitor treatment for MRSA infections. Test performance is not FDA approved in patients less than 54 years old. Performed at [facility], [place] [address], [place], [place] [address]      Labs: BNP (last 3 results) No results for input(s): BNP in the last 8760 hours. Basic Metabolic Panel: Recent Labs  Lab 03/23/21 1340 03/24/21 0455 03/24/21 0621 03/25/21 0232 03/26/21 0150 03/27/21 0224 03/28/21 0703 03/29/21 0347  NA  --    < > 134* 136 135 135 134* 133*  K  --    < > 3.7 3.7 3.7 3.7 4.1 4.0  CL  --   --  103 106 104 102 101 102  CO2  --   --  25 23 21* 26 24 24   GLUCOSE  --   --  108* 105* 103* 115* 108* 89  BUN  --   --  9 9 13  5* 13 18  CREATININE  --   --  0.86 0.73 0.82 0.90 0.79 0.79  CALCIUM  --   --  8.2* 8.7* 8.8* 8.4* 9.0 8.4*  MG 1.8  --  1.7 1.7  --   --   --   --   PHOS 3.3  --  3.7  --   --   --   --   --     < > = values in this interval not displayed.   Liver Function Tests: Recent Labs  Lab 03/23/21 1107 03/25/21 0232  AST 48* 65*  ALT 32 36  ALKPHOS 69 54  BILITOT 0.6 1.3*  PROT 7.0 6.0*  ALBUMIN 3.5 2.7*   No results for input(s): LIPASE, AMYLASE in the last 168 hours. No results for input(s): AMMONIA in the last 168 hours. CBC: Recent Labs  Lab 03/23/21 1107 03/23/21 1110 03/25/21 0232 03/26/21 0150 03/27/21 0224 03/28/21 0703 03/29/21 0347  WBC 7.0   < > 8.2 6.9 7.1 5.8 5.3  NEUTROABS 3.3  --  5.9  --   --   --   --   HGB 14.5   < >  13.0 13.3 13.6 14.3 13.6  HCT 47.5   < > 39.7 41.2 41.8 43.7 41.9  MCV 93.5   < > 87.8 88.8 87.1 86.2 87.5  PLT 210   < > 176 180 196 207 199   < > = values in this interval not displayed.   Cardiac Enzymes: Recent Labs  Lab 03/23/21 1340  CKTOTAL 166   BNP: Invalid input(s): POCBNP CBG: Recent Labs  Lab 03/29/21 1548 03/29/21 1950 03/29/21 2308 03/30/21 0400 03/30/21 0822  GLUCAP 118* 137* 127* 99 98   D-Dimer No results for input(s): DDIMER in the last 72 hours. Hgb A1c No results for input(s): HGBA1C in the last 72 hours. Lipid Profile No results for input(s): CHOL, HDL, LDLCALC, TRIG, CHOLHDL, LDLDIRECT in the last 72 hours. Thyroid function studies No results for input(s): TSH, T4TOTAL, T3FREE, THYROIDAB in the last 72 hours.  Invalid input(s): FREET3 Anemia work up No results for input(s): VITAMINB12, FOLATE, FERRITIN, TIBC, IRON, RETICCTPCT in the last 72 hours. Urinalysis    Component Value Date/Time   COLORURINE YELLOW 10/07/2020 1518   APPEARANCEUR CLEAR 10/07/2020 1518   LABSPEC 1.012 10/07/2020 1518   PHURINE 7.0 10/07/2020 1518   GLUCOSEU NEGATIVE 10/07/2020 1518   HGBUR NEGATIVE 10/07/2020 1518   BILIRUBINUR NEGATIVE 10/07/2020 1518   KETONESUR 20 (A) 10/07/2020 1518   PROTEINUR NEGATIVE 10/07/2020 1518   NITRITE NEGATIVE 10/07/2020 1518   LEUKOCYTESUR NEGATIVE 10/07/2020 1518   Sepsis  Labs Invalid input(s): PROCALCITONIN,  WBC,  LACTICIDVEN Microbiology Recent Results (from the past 240 hour(s))  Resp Panel by RT-PCR (Flu A&B, Covid) Nasopharyngeal Swab     Status: None   Collection Time: 03/23/21  4:46 PM   Specimen: Nasopharyngeal Swab; Nasopharyngeal(NP) swabs in vial transport medium  Result Value Ref Range Status   SARS Coronavirus 2 by RT PCR NEGATIVE NEGATIVE Final    Comment: (NOTE) SARS-CoV-2 target nucleic acids are NOT DETECTED.  The SARS-CoV-2 RNA is generally detectable in upper respiratory specimens during the acute phase of infection. The lowest concentration of SARS-CoV-2 viral copies this assay can detect is 138 copies/mL. A negative result does not preclude SARS-Cov-2 infection and should not be used as the sole basis for treatment or other patient management decisions. A negative result may occur with  improper specimen collection/handling, submission of specimen other than nasopharyngeal swab, presence of viral mutation(s) within the areas targeted by this assay, and inadequate number of viral copies(<138 copies/mL). A negative result must be combined with clinical observations, patient history, and epidemiological information. The expected result is Negative.  Fact Sheet for Patients:  EntrepreneurPulse.com.au  Fact Sheet for Healthcare Providers:  IncredibleEmployment.be  This test is no t yet approved or cleared by the [place] FDA and  has been authorized for detection and/or diagnosis of SARS-CoV-2 by FDA under an Emergency Use Authorization (EUA). This EUA will remain  in effect (meaning this test can be used) for the duration of the COVID-19 declaration under Section 564(b)(1) of the Act, 21 U.S.C.section 360bbb-3(b)(1), unless the authorization is terminated  or revoked sooner.       Influenza A by PCR NEGATIVE NEGATIVE Final   Influenza B by PCR NEGATIVE NEGATIVE Final    Comment:  (NOTE) The Xpert Xpress SARS-CoV-2/FLU/RSV plus assay is intended as an aid in the diagnosis of influenza from Nasopharyngeal swab specimens and should not be used as a sole basis for treatment. Nasal washings and aspirates are unacceptable for Xpert Xpress SARS-CoV-2/FLU/RSV testing.  Fact Sheet  for Patients: EntrepreneurPulse.com.au  Fact Sheet for Healthcare Providers: IncredibleEmployment.be  This test is not yet approved or cleared by the [place] FDA and has been authorized for detection and/or diagnosis of SARS-CoV-2 by FDA under an Emergency Use Authorization (EUA). This EUA will remain in effect (meaning this test can be used) for the duration of the COVID-19 declaration under Section 564(b)(1) of the Act, 21 U.S.C. section 360bbb-3(b)(1), unless the authorization is terminated or revoked.  Performed at [facility], [place] [address], [place], [place] [address]   MRSA Next Gen by PCR, Nasal     Status: None   Collection Time: 03/23/21  8:11 PM   Specimen: Nasal Mucosa; Nasal Swab  Result Value Ref Range Status   MRSA by PCR Next Gen NOT DETECTED NOT DETECTED Final    Comment: (NOTE) The GeneXpert MRSA Assay (FDA approved for NASAL specimens only), is one component of a comprehensive MRSA colonization surveillance program. It is not intended to diagnose MRSA infection nor to guide or monitor treatment for MRSA infections. Test performance is not FDA approved in patients less than 20 years old. Performed at [facility], [place] [address], [place], [place] [address]     Please note: You were cared for by a hospitalist during your hospital stay. Once you are discharged, your primary care physician will handle any further medical issues. Please note that NO REFILLS for any discharge medications will be authorized once you are discharged, as it is imperative that you return to your primary care physician (or establish a  relationship with a primary care physician if you do not have one) for your post hospital discharge needs so that they can reassess your need for medications and monitor your lab values.    Time coordinating discharge: 40 minutes  SIGNED:   Shelly Coss, MD  [facility] 03/30/2021, 9:57 AM Pager [phone]  If 7PM-7AM, please contact night-coverage [email or web address] Password [email or web address]

## 2021-03-31 ENCOUNTER — Inpatient Hospital Stay (HOSPITAL_COMMUNITY): Payer: Medicare Other

## 2021-03-31 DIAGNOSIS — R569 Unspecified convulsions: Secondary | ICD-10-CM | POA: Diagnosis not present

## 2021-03-31 LAB — GLUCOSE, CAPILLARY
Glucose-Capillary: 115 mg/dL — ABNORMAL HIGH (ref 70–99)
Glucose-Capillary: 118 mg/dL — ABNORMAL HIGH (ref 70–99)
Glucose-Capillary: 123 mg/dL — ABNORMAL HIGH (ref 70–99)
Glucose-Capillary: 142 mg/dL — ABNORMAL HIGH (ref 70–99)
Glucose-Capillary: 154 mg/dL — ABNORMAL HIGH (ref 70–99)
Glucose-Capillary: 167 mg/dL — ABNORMAL HIGH (ref 70–99)
Glucose-Capillary: 62 mg/dL — ABNORMAL LOW (ref 70–99)

## 2021-03-31 LAB — RESP PANEL BY RT-PCR (FLU A&B, COVID) ARPGX2
Influenza A by PCR: NEGATIVE
Influenza B by PCR: NEGATIVE
SARS Coronavirus 2 by RT PCR: POSITIVE — AB

## 2021-03-31 MED ORDER — THIAMINE HCL 100 MG PO TABS
100.0000 mg | ORAL_TABLET | Freq: Every day | ORAL | Status: DC
  Administered 2021-04-01 – 2021-04-14 (×14): 100 mg via ORAL
  Filled 2021-03-31 (×14): qty 1

## 2021-03-31 NOTE — Progress Notes (Addendum)
Patient COVID testing resulted as 'Positive'. Placed on Airborne and Contact precautions.

## 2021-03-31 NOTE — Progress Notes (Signed)
PROGRESS NOTE    Dennis Levine  AVW:[id number] DOB: Dec 30, 1950 DOA: 03/23/2021 PCP: Pcp, No   Chief Complain: Seizure  Brief Narrative:  Patient is a 70 year old male with history of prostate cancer, hepatitis C, hypertension, alcohol use who was found down at home by family on 12/13.  He was observed to be seizing during transfer to ER via EMS.  Code stroke was called.  On presentation he was hypoxic, tachypneic.  Patient was intubated in the ED for concern of ongoing seizure, airway protection, admitted under PCCM service.  Neurology was consulted.  Transferred to [facility] service on 12/16.  Hospital course remarkable for persistent confusion/agitation most likely secondary to alcohol withdrawal.  Respiratory status, mental status has improved and currently at baseline.  PT/OT recommended skilled nursing facility on discharge, was ready for discharge yesterday but his COVID test incidentally came out to be positive.  He is completely asymptomatic.  Discharge prolonged due to isolation status.  TOC following  Assessment & Plan:   Principal Problem:   Seizures ([place]) Active Problems:   Protein-calorie malnutrition, severe   New onset seizures: Presented with episode of unresponsiveness, left word gaze, seizures followed by left-sided weakness.  MRI of the brain did not show any acute infarct or intracranial process, no definitive etiology for seizure.  But showed dural based mass along the right bilateral convexity consistent with meningioma.  EEG showed epileptogenicity from the right frontal region but no active seizures.  Neurology was following.  Currently on Keppra.  He needs to follow-up with neurology as an outpatient.  Neurology thought that alcohol could be the etiology for seizure.  Currently alert and oriented  Meningioma: Described as above.    He needs to follow-up with neurosurgery as an outpatient.  No signal change in the brain parenchyma adjacent to the meningioma making it less  likely the cause for seizure  Acute hypoxic respiratory failure required intubation: Intubated on 12/13 for airway protection.  Extubated on 12/14.  Chest x-ray done on 12/17 showed mild vascular congestion with no focal infiltrate.  Currently on room air  Diastolic congestive heart failure: Currently euvolemic.  Echo on 03/24/21 showed EF of 65 to 82%, grade 1 diastolic function.  Hypertension: Takes amlodipine at home.added metoprolol for persistent hypertension, tachycardia.  Monitor blood pressure.Now stable  Post ictal state/delirium: Hospital course remarkable for delirium.  Was given Precedex in the ICU.  He was on Zyprexa which will be stopped due to persistent sleepiness/drowsiness.  Mental status has improved today.  Hepatitis C/prostate cancer: Management as outpatient.  Alcohol withdrawal/alcohol abuse: Drinks 6 packs of beer every day.  Counseled for cessation.  Continue thiamine and folic acid.  Given high-dose thiamine.  He was on CIWA protocol,also on phenobarbital, stopped  Debility/deconditioning: PT/OT recommending skilled nursing facility on discharge.  Incidental COVID positive: COVID screen test that was done for preparation for  SNF discharge came out to be positive.  Completely asymptomatic.  On room air.  Chest x-ray does not show any infiltrate.  Continue supportive care.  No indication for treatment  Nutrition Problem: Severe Malnutrition Etiology: chronic illness (h/o cancer, hep C)      DVT prophylaxis:Lovenox Code Status: Full Family Communication: Son on phone on 03/31/2021 Patient status:Inpatient  Dispo: The patient is from: Home              Anticipated d/c is to: SNF              Anticipated d/c date is:as soon as  bed is available at SNF.  Prolonged due to isolation status for COVID  Consultants: PCCM, neurology  Procedures: Intubation, EEG  Antimicrobials:  Anti-infectives (From admission, onward)    None       Subjective:  Patient  seen and examined the bedside this morning.  Hemodynamically stable.  On room air.  Denies any complaints  Objective: Vitals:   03/30/21 2350 03/31/21 0322 03/31/21 0939 03/31/21 1202  BP: 136/71 119/74 (!) 149/74 124/76  Pulse: 76 67 74 75  Resp: 16 16 15 15   Temp: 98.1 F (36.7 C) 98.3 F (36.8 C) 97.8 F (36.6 C) 99 F (37.2 C)  TempSrc: Oral  Oral Oral  SpO2: 99% 97% 100% 99%  Weight:      Height:        Intake/Output Summary (Last 24 hours) at 03/31/2021 1455 Last data filed at 03/31/2021 1000 Gross per 24 hour  Intake 464 ml  Output 750 ml  Net -286 ml   Filed Weights   03/28/21 0354 03/29/21 0711 03/30/21 0325  Weight: 63.1 kg 62.8 kg 62.3 kg    Examination:  General exam: Overall comfortable, not in distress, deconditioned, chronically ill looking HEENT: PERRL Respiratory system:  no wheezes or crackles  Cardiovascular system: S1 & S2 heard, RRR.  Gastrointestinal system: Abdomen is nondistended, soft and nontender. Central nervous system: Alert and oriented Extremities: No edema, no clubbing ,no cyanosis Skin: No rashes, no ulcers,no icterus    Data Reviewed: I have personally reviewed following labs and imaging studies  CBC: Recent Labs  Lab 03/25/21 0232 03/26/21 0150 03/27/21 0224 03/28/21 0703 03/29/21 0347  WBC 8.2 6.9 7.1 5.8 5.3  NEUTROABS 5.9  --   --   --   --   HGB 13.0 13.3 13.6 14.3 13.6  HCT 39.7 41.2 41.8 43.7 41.9  MCV 87.8 88.8 87.1 86.2 87.5  PLT 176 180 196 207 025   Basic Metabolic Panel: Recent Labs  Lab 03/25/21 0232 03/26/21 0150 03/27/21 0224 03/28/21 0703 03/29/21 0347  NA 136 135 135 134* 133*  K 3.7 3.7 3.7 4.1 4.0  CL 106 104 102 101 102  CO2 23 21* 26 24 24   GLUCOSE 105* 103* 115* 108* 89  BUN 9 13 5* 13 18  CREATININE 0.73 0.82 0.90 0.79 0.79  CALCIUM 8.7* 8.8* 8.4* 9.0 8.4*  MG 1.7  --   --   --   --    GFR: Estimated Creatinine Clearance: 74.6 mL/min (by C-G formula based on SCr of 0.79  mg/dL). Liver Function Tests: Recent Labs  Lab 03/25/21 0232  AST 65*  ALT 36  ALKPHOS 54  BILITOT 1.3*  PROT 6.0*  ALBUMIN 2.7*   No results for input(s): LIPASE, AMYLASE in the last 168 hours. No results for input(s): AMMONIA in the last 168 hours. Coagulation Profile: No results for input(s): INR, PROTIME in the last 168 hours.  Cardiac Enzymes: No results for input(s): CKTOTAL, CKMB, CKMBINDEX, TROPONINI in the last 168 hours.  BNP (last 3 results) No results for input(s): PROBNP in the last 8760 hours. HbA1C: No results for input(s): HGBA1C in the last 72 hours.  CBG: Recent Labs  Lab 03/30/21 2347 03/31/21 0320 03/31/21 0842 03/31/21 0945 03/31/21 1205  GLUCAP 122* 115* 62* 123* 154*   Lipid Profile: No results for input(s): CHOL, HDL, LDLCALC, TRIG, CHOLHDL, LDLDIRECT in the last 72 hours.  Thyroid Function Tests: No results for input(s): TSH, T4TOTAL, FREET4, T3FREE, THYROIDAB in the last 72  hours. Anemia Panel: No results for input(s): VITAMINB12, FOLATE, FERRITIN, TIBC, IRON, RETICCTPCT in the last 72 hours. Sepsis Labs: No results for input(s): PROCALCITON, LATICACIDVEN in the last 168 hours.   Recent Results (from the past 240 hour(s))  Resp Panel by RT-PCR (Flu A&B, Covid) Nasopharyngeal Swab     Status: None   Collection Time: 03/23/21  4:46 PM   Specimen: Nasopharyngeal Swab; Nasopharyngeal(NP) swabs in vial transport medium  Result Value Ref Range Status   SARS Coronavirus 2 by RT PCR NEGATIVE NEGATIVE Final    Comment: (NOTE) SARS-CoV-2 target nucleic acids are NOT DETECTED.  The SARS-CoV-2 RNA is generally detectable in upper respiratory specimens during the acute phase of infection. The lowest concentration of SARS-CoV-2 viral copies this assay can detect is 138 copies/mL. A negative result does not preclude SARS-Cov-2 infection and should not be used as the sole basis for treatment or other patient management decisions. A negative result  may occur with  improper specimen collection/handling, submission of specimen other than nasopharyngeal swab, presence of viral mutation(s) within the areas targeted by this assay, and inadequate number of viral copies(<138 copies/mL). A negative result must be combined with clinical observations, patient history, and epidemiological information. The expected result is Negative.  Fact Sheet for Patients:  EntrepreneurPulse.com.au  Fact Sheet for Healthcare Providers:  IncredibleEmployment.be  This test is no t yet approved or cleared by the [place] FDA and  has been authorized for detection and/or diagnosis of SARS-CoV-2 by FDA under an Emergency Use Authorization (EUA). This EUA will remain  in effect (meaning this test can be used) for the duration of the COVID-19 declaration under Section 564(b)(1) of the Act, 21 U.S.C.section 360bbb-3(b)(1), unless the authorization is terminated  or revoked sooner.       Influenza A by PCR NEGATIVE NEGATIVE Final   Influenza B by PCR NEGATIVE NEGATIVE Final    Comment: (NOTE) The Xpert Xpress SARS-CoV-2/FLU/RSV plus assay is intended as an aid in the diagnosis of influenza from Nasopharyngeal swab specimens and should not be used as a sole basis for treatment. Nasal washings and aspirates are unacceptable for Xpert Xpress SARS-CoV-2/FLU/RSV testing.  Fact Sheet for Patients: EntrepreneurPulse.com.au  Fact Sheet for Healthcare Providers: IncredibleEmployment.be  This test is not yet approved or cleared by the [place] FDA and has been authorized for detection and/or diagnosis of SARS-CoV-2 by FDA under an Emergency Use Authorization (EUA). This EUA will remain in effect (meaning this test can be used) for the duration of the COVID-19 declaration under Section 564(b)(1) of the Act, 21 U.S.C. section 360bbb-3(b)(1), unless the authorization is terminated  or revoked.  Performed at [facility], [place] [address] [place], [place] [address]   MRSA Next Gen by PCR, Nasal     Status: None   Collection Time: 03/23/21  8:11 PM   Specimen: Nasal Mucosa; Nasal Swab  Result Value Ref Range Status   MRSA by PCR Next Gen NOT DETECTED NOT DETECTED Final    Comment: (NOTE) The GeneXpert MRSA Assay (FDA approved for NASAL specimens only), is one component of a comprehensive MRSA colonization surveillance program. It is not intended to diagnose MRSA infection nor to guide or monitor treatment for MRSA infections. Test performance is not FDA approved in patients less than 28 years old. Performed at [facility], [place] [address] [place], [place] [address]   Resp Panel by RT-PCR (Flu A&B, Covid) Nasopharyngeal Swab     Status: Abnormal  Collection Time: 03/30/21  3:57 PM   Specimen: Nasopharyngeal Swab; Nasopharyngeal(NP) swabs in vial transport medium  Result Value Ref Range Status   SARS Coronavirus 2 by RT PCR POSITIVE (A) NEGATIVE Final    Comment: (NOTE) SARS-CoV-2 target nucleic acids are DETECTED.  The SARS-CoV-2 RNA is generally detectable in upper respiratory specimens during the acute phase of infection. Positive results are indicative of the presence of the identified virus, but do not rule out bacterial infection or co-infection with other pathogens not detected by the test. Clinical correlation with patient history and other diagnostic information is necessary to determine patient infection status. The expected result is Negative.  Fact Sheet for Patients: EntrepreneurPulse.com.au  Fact Sheet for Healthcare Providers: IncredibleEmployment.be  This test is not yet approved or cleared by the [place] FDA and  has been authorized for detection and/or diagnosis of SARS-CoV-2 by FDA under an Emergency Use Authorization (EUA).  This EUA will remain in effect (meaning this test can be  used) for the duration of  the COVID-19 declaration under Section 564(b)(1) of the A ct, 21 U.S.C. section 360bbb-3(b)(1), unless the authorization is terminated or revoked sooner.     Influenza A by PCR NEGATIVE NEGATIVE Final   Influenza B by PCR NEGATIVE NEGATIVE Final    Comment: (NOTE) The Xpert Xpress SARS-CoV-2/FLU/RSV plus assay is intended as an aid in the diagnosis of influenza from Nasopharyngeal swab specimens and should not be used as a sole basis for treatment. Nasal washings and aspirates are unacceptable for Xpert Xpress SARS-CoV-2/FLU/RSV testing.  Fact Sheet for Patients: EntrepreneurPulse.com.au  Fact Sheet for Healthcare Providers: IncredibleEmployment.be  This test is not yet approved or cleared by the [place] FDA and has been authorized for detection and/or diagnosis of SARS-CoV-2 by FDA under an Emergency Use Authorization (EUA). This EUA will remain in effect (meaning this test can be used) for the duration of the COVID-19 declaration under Section 564(b)(1) of the Act, 21 U.S.C. section 360bbb-3(b)(1), unless the authorization is terminated or revoked.  Performed at [facility], [place] [address], [place], [place] [address]   Resp Panel by RT-PCR (Flu A&B, Covid) Nasopharyngeal Swab     Status: Abnormal   Collection Time: 03/31/21  1:01 PM   Specimen: Nasopharyngeal Swab; Nasopharyngeal(NP) swabs in vial transport medium  Result Value Ref Range Status   SARS Coronavirus 2 by RT PCR POSITIVE (A) NEGATIVE Final    Comment: (NOTE) SARS-CoV-2 target nucleic acids are DETECTED.  The SARS-CoV-2 RNA is generally detectable in upper respiratory specimens during the acute phase of infection. Positive results are indicative of the presence of the identified virus, but do not rule out bacterial infection or co-infection with other pathogens not detected by the test. Clinical correlation with patient history  and other diagnostic information is necessary to determine patient infection status. The expected result is Negative.  Fact Sheet for Patients: EntrepreneurPulse.com.au  Fact Sheet for Healthcare Providers: IncredibleEmployment.be  This test is not yet approved or cleared by the [place] FDA and  has been authorized for detection and/or diagnosis of SARS-CoV-2 by FDA under an Emergency Use Authorization (EUA).  This EUA will remain in effect (meaning this test can be used) for the duration of  the COVID-19 declaration under Section 564(b)(1) of the A ct, 21 U.S.C. section 360bbb-3(b)(1), unless the authorization is terminated or revoked sooner.     Influenza A by PCR NEGATIVE NEGATIVE Final   Influenza B by PCR NEGATIVE NEGATIVE Final  Comment: (NOTE) The Xpert Xpress SARS-CoV-2/FLU/RSV plus assay is intended as an aid in the diagnosis of influenza from Nasopharyngeal swab specimens and should not be used as a sole basis for treatment. Nasal washings and aspirates are unacceptable for Xpert Xpress SARS-CoV-2/FLU/RSV testing.  Fact Sheet for Patients: EntrepreneurPulse.com.au  Fact Sheet for Healthcare Providers: IncredibleEmployment.be  This test is not yet approved or cleared by the [place] FDA and has been authorized for detection and/or diagnosis of SARS-CoV-2 by FDA under an Emergency Use Authorization (EUA). This EUA will remain in effect (meaning this test can be used) for the duration of the COVID-19 declaration under Section 564(b)(1) of the Act, 21 U.S.C. section 360bbb-3(b)(1), unless the authorization is terminated or revoked.  Performed at [facility], [place] [address], [place], [place] [address]          Radiology Studies: DG CHEST PORT 1 VIEW  Result Date: 03/31/2021 CLINICAL DATA:  COVID-19 EXAM: PORTABLE CHEST 1 VIEW COMPARISON:  03/27/2021 FINDINGS: Heart is  upper limits normal in size. Lungs clear. No effusions. No acute bony abnormality. IMPRESSION: No active disease. Electronically Signed   By: Rolm Baptise M.D.   On: 03/31/2021 12:03        Scheduled Meds:  amLODipine  10 mg Oral Daily   enoxaparin (LOVENOX) injection  40 mg Subcutaneous Daily   feeding supplement  237 mL Oral TID BM   folic acid  1 mg Oral Daily   levETIRAcetam  500 mg Oral BID   metoprolol tartrate  25 mg Oral BID   multivitamin with minerals  1 tablet Oral Daily   [START ON 04/02/2021] thiamine injection  100 mg Intravenous Daily   Continuous Infusions:  sodium chloride Stopped (03/26/21 0155)   thiamine injection 250 mg (03/31/21 0907)     LOS: 8 days    Time spent: 25 mins.More than 50% of that time was spent in counseling and/or coordination of care.      Shelly Coss, MD [facility] P12/21/2022, 2:55 PM

## 2021-03-31 NOTE — TOC Progression Note (Signed)
Transition of Care [facility]) - Progression Note    Patient Details  Name: Dennis Levine MRN: [id number] Date of Birth: Aug 12, 1950  Transition of Care [facility]) CM/SW [place], [place] Phone Number: 03/31/2021, 4:20 PM  Clinical Narrative:   CSW notified that patient tested positive for COVID yesterday for planning to discharge to SNF today. Repeat COVID also positive. CSW spoke with patient's son, Legrand Como, who was upset about the positive covid test. Legrand Como expressed concern about the patient having to go somewhere else, but CSW confirmed that patient will still go to [place] after isolation is complete, and son was appreciative. CSW updated [facility], who can admit after isolation period. CSW to follow.    Expected Discharge Plan: Skilled Nursing Facility Barriers to Discharge: SNF Covid  Expected Discharge Plan and Services Expected Discharge Plan: [place] In-house Referral: Clinical Social Work   Post Acute Care Choice: [place] Living arrangements for the past 2 months: Single Family Home                                       Social Determinants of Health (SDOH) Interventions    Readmission Risk Interventions No flowsheet data found.

## 2021-03-31 NOTE — Progress Notes (Signed)
Physical Therapy Treatment Patient Details Name: Dennis Levine MRN: [id number] DOB: March 13, 1951 Today's Date: 03/31/2021   History of Present Illness 70 yo admitted 12/13 after found unresponsive in bathroom with witnessed seizure and intubated 12/13-12/14. Head CT (-), MRI demonstrated Rt meningioma. PMhx: ETOH abuse, HTN, hep C    PT Comments    Pt received in supine, agreeable to therapy session and with good participation and tolerance for gait and transfer training in room. Pt noted to have decreased Rt sided awareness vs visual deficit and tending to catch Rt corner of RW on objects in room despite cues for environmental scanning. Pt inconsistent with reports of vision/need for glasses and not donned until after ambulation. Pt needing consistent minA for sit<>stand from bed/chair for lift assist and mod cues for safe sequencing. Pt with decreased safety awareness. Pt continues to benefit from PT services to progress toward functional mobility goals.    Recommendations for follow up therapy are one component of a multi-disciplinary discharge planning process, led by the attending physician.  Recommendations may be updated based on patient status, additional functional criteria and insurance authorization.  Follow Up Recommendations  Skilled nursing-short term rehab (<3 hours/day)     Assistance Recommended at Discharge Frequent or constant Supervision/Assistance  Equipment Recommendations  Rolling walker (2 wheels);BSC/3in1    Recommendations for Other Services       Precautions / Restrictions Precautions Precautions: Fall Precaution Comments: Covid+ Air/Contact Restrictions Weight Bearing Restrictions: No     Mobility  Bed Mobility Overal bed mobility: Needs Assistance Bed Mobility: Supine to Sit     Supine to sit: Min guard;HOB elevated     General bed mobility comments: increased time to perform, no physical assist needed, cues for safety    Transfers Overall  transfer level: Needs assistance Equipment used: Rolling walker (2 wheels) Transfers: Sit to/from Stand Sit to Stand: Min assist           General transfer comment: min assist for balance and safe walker use, pt pulling up on RW needs vcs for hand placement each transfer (poor carryover of instruction within session).    Ambulation/Gait Ambulation/Gait assistance: Min assist Gait Distance (Feet): 75 Feet Assistive device: Rolling walker (2 wheels) Gait Pattern/deviations: Step-through pattern;Decreased stride length;Drifts right/left;Narrow base of support;Trunk flexed       General Gait Details: pt reliant on RW and able to progress gait distance with RW support but consistently drifting to Rt side and bumping into objects on his left side; pt has glasses in room but did not request to wear them, then states "they aren't mine but they help" at end of session. VSS on RA   Stairs             Wheelchair Mobility    Modified Rankin (Stroke Patients Only)       Balance Overall balance assessment: Needs assistance Sitting-balance support: Feet supported Sitting balance-Leahy Scale: Fair Sitting balance - Comments: able to maintain sitting balance on EOB   Standing balance support: No upper extremity supported;Single extremity supported Standing balance-Leahy Scale: Poor Standing balance comment: consistent minA with RW use for dynamic tasks                            Cognition Arousal/Alertness: Awake/alert Behavior During Therapy: WFL for tasks assessed/performed Overall Cognitive Status: Impaired/Different from baseline Area of Impairment: Orientation;Memory;Safety/judgement;Awareness;Following commands;Problem solving  Memory: Decreased short-term memory Following Commands: Follows one step commands consistently Safety/Judgement: Decreased awareness of safety;Decreased awareness of deficits Awareness: Emergent Problem  Solving: Slow processing;Requires verbal cues General Comments: pt able to recall some aspects of previous session including walking to look at christmas trees and some objects he had seen on tree. Pt seeming to have decreased Rt sided awareness vs visual deficit and tending to bump into objects on Rt side and veering toward R side of room.        Exercises      General Comments General comments (skin integrity, edema, etc.): BP 142/76 (95) ; HR 87 bpm; SpO2 97% on RA      Pertinent Vitals/Pain Pain Assessment: No/denies pain    Home Living                          Prior Function            PT Goals (current goals can now be found in the care plan section) Acute Rehab PT Goals Patient Stated Goal: to get better and go to rehab PT Goal Formulation: With patient Time For Goal Achievement: 04/09/21 Progress towards PT goals: Progressing toward goals    Frequency    Min 3X/week      PT Plan Current plan remains appropriate    Co-evaluation              AM-PAC PT "6 Clicks" Mobility   Outcome Measure  Help needed turning from your back to your side while in a flat bed without using bedrails?: A Little Help needed moving from lying on your back to sitting on the side of a flat bed without using bedrails?: A Little Help needed moving to and from a bed to a chair (including a wheelchair)?: A Lot (mod cues) Help needed standing up from a chair using your arms (e.g., wheelchair or bedside chair)?: A Little Help needed to walk in hospital room?: A Lot Help needed climbing 3-5 steps with a railing? : Total 6 Click Score: 14    End of Session Equipment Utilized During Treatment: Gait belt Activity Tolerance: Patient tolerated treatment well Patient left: in chair;with call bell/phone within reach;with chair alarm set;Other (comment) (reviewed call bell use, he will likely need reminders) Nurse Communication: Mobility status PT Visit Diagnosis: Other  abnormalities of gait and mobility (R26.89);Difficulty in walking, not elsewhere classified (R26.2);Muscle weakness (generalized) (M62.81);Unsteadiness on feet (R26.81)     Time: 5916-3846 PT Time Calculation (min) (ACUTE ONLY): 28 min  Charges:  $Gait Training: 8-22 mins $Therapeutic Activity: 8-22 mins                     Benjamin Merrihew P., PTA Acute Rehabilitation Services Pager: [phone] Office: [place] 03/31/2021, 4:07 PM

## 2021-03-31 NOTE — Progress Notes (Signed)
Nutrition Follow-up  DOCUMENTATION CODES:   Severe malnutrition in context of chronic illness  INTERVENTION:   Consider initiation of bowel regimen as no BM documented x5 days  -continue Ensure Enlive po TID, each supplement provides 350 kcal and 20 grams of protein -continue MVI with minerals daily  NUTRITION DIAGNOSIS:   Severe Malnutrition related to chronic illness (h/o cancer, hep C) as evidenced by severe fat depletion, severe muscle depletion.  ongoing  GOAL:   Patient will meet greater than or equal to 90% of their needs  progressing  MONITOR:   PO intake, Supplement acceptance, Labs, Weight trends, I & O's  REASON FOR ASSESSMENT:   Consult Enteral/tube feeding initiation and management  ASSESSMENT:   Pt with PMH significant for prostate cancer, hepatitis C, and HTN found down at home and was admitted with new onset seizures, diffuse intracranial atherosclerosis, and acute hypoxic respiratory failure requiring intubation.  12/13 admitted with seizures after being found down at home; intubated 12/14 MRI with no infarct/acute intracranial process, but noted dural based mass consistent with meningioma along R parietal convexity; extubated  Pt was medically stable for discharge per MD with d/c summary posted; however, RN reports pt's COVID test came back positive today.  Pt w/o complaints at this time and reports improved appetite/intake  PO Intake: 25-100% x last 7 recorded meals (71% avg meal intake)   UOP: 780ml x24 hours I/O: -1035ml since admit  Current weight: 62.3 kg Admit weight: 63.7 kg   Edema: non-pitting edema to RUE per RN assessment  Consider initiation of bowel regimen as no BM documented x5 days  Medications: Scheduled Meds:  amLODipine  10 mg Oral Daily   enoxaparin (LOVENOX) injection  40 mg Subcutaneous Daily   feeding supplement  237 mL Oral TID BM   folic acid  1 mg Oral Daily   levETIRAcetam  500 mg Oral BID   metoprolol  tartrate  25 mg Oral BID   multivitamin with minerals  1 tablet Oral Daily   [START ON 04/02/2021] thiamine injection  100 mg Intravenous Daily  Continuous Infusions:  sodium chloride Stopped (03/26/21 0155)   thiamine injection 250 mg (03/31/21 0907)    Labs: Recent Labs  Lab 03/25/21 0232 03/26/21 0150 03/27/21 0224 03/28/21 0703 03/29/21 0347  NA 136   < > 135 134* 133*  K 3.7   < > 3.7 4.1 4.0  CL 106   < > 102 101 102  CO2 23   < > 26 24 24   BUN 9   < > 5* 13 18  CREATININE 0.73   < > 0.90 0.79 0.79  CALCIUM 8.7*   < > 8.4* 9.0 8.4*  MG 1.7  --   --   --   --   GLUCOSE 105*   < > 115* 108* 89   < > = values in this interval not displayed.  CBGs: 62-204 x24 hours   Diet Order:   Diet Order             Diet general           DIET DYS 3 Room service appropriate? Yes with Assist; Fluid consistency: Thin  Diet effective now                   EDUCATION NEEDS:   Education needs have been addressed  Skin:  Skin Assessment: Reviewed RN Assessment  Last BM:  12/16  Height:   Ht Readings from Last 1 Encounters:  03/23/21 5\' 10"  (1.778 m)    Weight:   Wt Readings from Last 1 Encounters:  03/30/21 62.3 kg    BMI:  Body mass index is 19.71 kg/m.  Estimated Nutritional Needs:   Kcal:  1900-2100  Protein:  95-105 grams  Fluid:  >1.9L     Theone Stanley., MS, RD, LDN (she/her/hers) RD pager number and weekend/on-call pager number located in [place].

## 2021-04-01 DIAGNOSIS — R569 Unspecified convulsions: Secondary | ICD-10-CM | POA: Diagnosis not present

## 2021-04-01 LAB — GLUCOSE, CAPILLARY
Glucose-Capillary: 110 mg/dL — ABNORMAL HIGH (ref 70–99)
Glucose-Capillary: 118 mg/dL — ABNORMAL HIGH (ref 70–99)
Glucose-Capillary: 137 mg/dL — ABNORMAL HIGH (ref 70–99)
Glucose-Capillary: 138 mg/dL — ABNORMAL HIGH (ref 70–99)
Glucose-Capillary: 161 mg/dL — ABNORMAL HIGH (ref 70–99)
Glucose-Capillary: 97 mg/dL (ref 70–99)
Glucose-Capillary: 97 mg/dL (ref 70–99)
Glucose-Capillary: 99 mg/dL (ref 70–99)

## 2021-04-01 NOTE — Progress Notes (Signed)
Occupational Therapy Treatment Patient Details Name: Dennis Levine MRN: [id number] DOB: 1950/06/11 Today's Date: 04/01/2021   History of present illness 70 yo admitted 12/13 after found unresponsive in bathroom with witnessed seizure and intubated 12/13-12/14. Head CT (-), MRI demonstrated Rt meningioma. PMhx: ETOH abuse, HTN, hep C   OT comments  Pt making good progress with OT goals this session. Pt able to complete basic Grooming and Toileting this session with supervision to min guard. Balance is much improved, however pt continuing to demonstrate difficulty with cognition for higher level tasks. He requires mod verbal cuing throughout session. Continuing to recommend SNF. OT will continue to follow acutely.    Recommendations for follow up therapy are one component of a multi-disciplinary discharge planning process, led by the attending physician.  Recommendations may be updated based on patient status, additional functional criteria and insurance authorization.    Follow Up Recommendations  Skilled nursing-short term rehab (<3 hours/day)    Assistance Recommended at Discharge Frequent or constant Supervision/Assistance  Equipment Recommendations  Tub/shower seat    Recommendations for Other Services      Precautions / Restrictions Precautions Precautions: Fall Precaution Comments: Covid+ Air/Contact Restrictions Weight Bearing Restrictions: No       Mobility Bed Mobility Overal bed mobility: Modified Independent             General bed mobility comments: No difficulties    Transfers Overall transfer level: Needs assistance Equipment used: Rolling walker (2 wheels) Transfers: Sit to/from Stand Sit to Stand: Min guard           General transfer comment: Min guard for balance and safety     Balance Overall balance assessment: Needs assistance Sitting-balance support: Feet supported Sitting balance-Leahy Scale: Good Sitting balance - Comments:  Maintaining static and dynamic balance with no difficulties.   Standing balance support: No upper extremity supported;Single extremity supported;During functional activity Standing balance-Leahy Scale: Fair Standing balance comment: Able to stand at sink and complete LB bathing and grooming at sink                           ADL either performed or assessed with clinical judgement   ADL Overall ADL's : Needs assistance/impaired     Grooming: Wash/dry hands;Wash/dry face;Oral care;Supervision/safety;Standing Grooming Details (indicate cue type and reason): Performed at sink     Lower Body Bathing: Minimal assistance;Sit to/from stand Lower Body Bathing Details (indicate cue type and reason): performed stanidng at sink         Toilet Transfer: Min guard;Ambulation Toilet Transfer Details (indicate cue type and reason): Pt using RW ambulated to bathroom, completed toileting with no physical assist. Toileting- Clothing Manipulation and Hygiene: Supervision/safety;Sitting/lateral lean Toileting - Clothing Manipulation Details (indicate cue type and reason): No difficulties.     Functional mobility during ADLs: Min guard;Rolling walker (2 wheels) General ADL Comments: Pt with increased stability and cognition this session. Contiues to require cuing for multistep commands.    Extremity/Trunk Assessment              Vision       Perception     Praxis      Cognition Arousal/Alertness: Awake/alert Behavior During Therapy: WFL for tasks assessed/performed Overall Cognitive Status: Impaired/Different from baseline Area of Impairment: Following commands;Safety/judgement;Attention;Problem solving                   Current Attention Level: Sustained   Following Commands: Follows one step commands  consistently Safety/Judgement: Decreased awareness of safety;Decreased awareness of deficits   Problem Solving: Slow processing;Requires verbal cues General  Comments: Pt recall's that he has covid, knows what day it is and that Christmas is in 3 days. Requiring increased time to follow commands at times.          Exercises     Shoulder Instructions       General Comments VSS on RA    Pertinent Vitals/ Pain       Pain Assessment: No/denies pain  Home Living                                          Prior Functioning/Environment              Frequency  Min 2X/week        Progress Toward Goals  OT Goals(current goals can now be found in the care plan section)  Progress towards OT goals: Progressing toward goals  Acute Rehab OT Goals Patient Stated Goal: to go home OT Goal Formulation: With patient Time For Goal Achievement: 04/09/21 Potential to Achieve Goals: Good ADL Goals Pt Will Perform Grooming: with supervision;standing Pt Will Perform Lower Body Bathing: with supervision;sit to/from stand Pt Will Perform Lower Body Dressing: with supervision;sit to/from stand Pt Will Transfer to Toilet: ambulating;with supervision Pt Will Perform Toileting - Clothing Manipulation and hygiene: with supervision;sit to/from stand  Plan Discharge plan remains appropriate;Frequency remains appropriate    Co-evaluation                 AM-PAC OT "6 Clicks" Daily Activity     Outcome Measure   Help from another person eating meals?: A Little Help from another person taking care of personal grooming?: A Little Help from another person toileting, which includes using toliet, bedpan, or urinal?: A Lot Help from another person bathing (including washing, rinsing, drying)?: A Little Help from another person to put on and taking off regular upper body clothing?: A Little Help from another person to put on and taking off regular lower body clothing?: A Lot 6 Click Score: 16    End of Session Equipment Utilized During Treatment: Gait belt;Rolling walker (2 wheels)  OT Visit Diagnosis: Unsteadiness on feet  (R26.81);Other symptoms and signs involving cognitive function   Activity Tolerance Patient tolerated treatment well   Patient Left in bed;with call bell/phone within reach;with bed alarm set   Nurse Communication Mobility status        Time: 6389-3734 OT Time Calculation (min): 45 min  Charges: OT General Charges $OT Visit: 1 Visit OT Treatments $Self Care/Home Management : 23-37 mins $Therapeutic Activity: 8-22 mins  Doyle Tegethoff H., OTR/L Acute Rehabilitation  Antonino Nienhuis Elane Tashiba Timoney 04/01/2021, 6:15 PM

## 2021-04-01 NOTE — Progress Notes (Signed)
PROGRESS NOTE    Dennis Levine  FAO:[id number] DOB: 1951/03/17 DOA: 03/23/2021 PCP: Pcp, No   Chief Complain: Seizure  Brief Narrative:  Patient is a 70 year old male with history of prostate cancer, hepatitis C, hypertension, alcohol use who was found down at home by family on 12/13.  He was observed to be seizing during transfer to ER via EMS.  Code stroke was called.  On presentation he was hypoxic, tachypneic.  Patient was intubated in the ED for concern of ongoing seizure, airway protection, admitted under PCCM service.  Neurology was consulted.  Transferred to [facility] service on 12/16.  Hospital course remarkable for persistent confusion/agitation most likely secondary to alcohol withdrawal.  Respiratory status, mental status has improved and currently at baseline.  PT/OT recommended skilled nursing facility on discharge, was ready for discharge yesterday but his COVID test incidentally came out to be positive.  He is completely asymptomatic.  Discharge prolonged due to isolation status.  TOC following.positive covid-19 screening test on 03/30/21 with 10 days isolation will be ready for dc to SNF on 04/10/21.  04/01/21:  No new complaints.  O2 sat 100% RA.  Assessment & Plan:   Principal Problem:   Seizures ([place]) Active Problems:   Protein-calorie malnutrition, severe   New onset seizures: Presented with episode of unresponsiveness, left word gaze, seizures followed by left-sided weakness.  MRI of the brain did not show any acute infarct or intracranial process, no definitive etiology for seizure.  But showed dural based mass along the right bilateral convexity consistent with meningioma.  EEG showed epileptogenicity from the right frontal region but no active seizures.  Neurology was following.  Currently on Keppra.  He needs to follow-up with neurology as an outpatient.  Neurology thought that alcohol could be the etiology for seizure.  Currently alert and oriented  Meningioma:  Described as above.    He needs to follow-up with neurosurgery as an outpatient.  No signal change in the brain parenchyma adjacent to the meningioma making it less likely the cause for seizure  Acute hypoxic respiratory failure required intubation: Intubated on 12/13 for airway protection.  Extubated on 12/14.  Chest x-ray done on 12/17 showed mild vascular congestion with no focal infiltrate.  Currently on room air  Diastolic congestive heart failure: Currently euvolemic.  Echo on 03/24/21 showed EF of 65 to 69%, grade 1 diastolic function.  Hypertension: Takes amlodipine at home.added metoprolol for persistent hypertension, tachycardia.  Monitor blood pressure.Now stable  Post ictal state/delirium: Hospital course remarkable for delirium.  Was given Precedex in the ICU.  He was on Zyprexa which will be stopped due to persistent sleepiness/drowsiness.  Mental status has improved today.  Hepatitis C/prostate cancer: Management as outpatient.  Alcohol withdrawal/alcohol abuse: Drinks 6 packs of beer every day.  Counseled for cessation.  Continue thiamine and folic acid.  Given high-dose thiamine.  He was on CIWA protocol,also on phenobarbital, stopped  Debility/deconditioning: PT/OT recommending skilled nursing facility on discharge.  Incidental COVID positive: COVID screen test that was done for preparation for  SNF discharge came out to be positive.  Completely asymptomatic.  On 100% room air.  Chest x-ray does not show any infiltrate.  Continue supportive care.  No indication for treatment  Nutrition Problem: Severe Malnutrition Etiology: chronic illness (h/o cancer, hep C)      DVT prophylaxis:Lovenox Code Status: Full Family Communication: Son on phone on 03/31/2021 Patient status:Inpatient  Dispo: The patient is from: Home  Anticipated d/c is to: SNF              Anticipated d/c date is:as soon as bed is available at SNF.  Prolonged due to isolation status for  COVID  Consultants: PCCM, neurology  Procedures: Intubation, EEG  Antimicrobials:  Anti-infectives (From admission, onward)    None        Objective: Vitals:   03/31/21 2109 03/31/21 2345 04/01/21 0325 04/01/21 0842  BP: (!) 151/79 112/71 (!) 124/93 139/72  Pulse: 97 87 72 81  Resp:  17 17 15   Temp:  98 F (36.7 C) 98.2 F (36.8 C) 98.4 F (36.9 C)  TempSrc:   Oral Oral  SpO2:  97% 100% 100%  Weight:      Height:        Intake/Output Summary (Last 24 hours) at 04/01/2021 1137 Last data filed at 03/31/2021 2117 Gross per 24 hour  Intake 300 ml  Output --  Net 300 ml   Filed Weights   03/28/21 0354 03/29/21 0711 03/30/21 0325  Weight: 63.1 kg 62.8 kg 62.3 kg    Examination:  General exam: Overall comfortable, not in distress, deconditioned, chronically ill looking HEENT: PERRL Respiratory system:  no wheezes or crackles  Cardiovascular system: S1 & S2 heard, RRR.  Gastrointestinal system: Abdomen is nondistended, soft and nontender. Central nervous system: Alert and oriented Extremities: No edema, no clubbing ,no cyanosis Skin: No rashes, no ulcers,no icterus    Data Reviewed: I have personally reviewed following labs and imaging studies  CBC: Recent Labs  Lab 03/26/21 0150 03/27/21 0224 03/28/21 0703 03/29/21 0347  WBC 6.9 7.1 5.8 5.3  HGB 13.3 13.6 14.3 13.6  HCT 41.2 41.8 43.7 41.9  MCV 88.8 87.1 86.2 87.5  PLT 180 196 207 102   Basic Metabolic Panel: Recent Labs  Lab 03/26/21 0150 03/27/21 0224 03/28/21 0703 03/29/21 0347  NA 135 135 134* 133*  K 3.7 3.7 4.1 4.0  CL 104 102 101 102  CO2 21* 26 24 24   GLUCOSE 103* 115* 108* 89  BUN 13 5* 13 18  CREATININE 0.82 0.90 0.79 0.79  CALCIUM 8.8* 8.4* 9.0 8.4*   GFR: Estimated Creatinine Clearance: 74.6 mL/min (by C-G formula based on SCr of 0.79 mg/dL). Liver Function Tests: No results for input(s): AST, ALT, ALKPHOS, BILITOT, PROT, ALBUMIN in the last 168 hours.  No results for  input(s): LIPASE, AMYLASE in the last 168 hours. No results for input(s): AMMONIA in the last 168 hours. Coagulation Profile: No results for input(s): INR, PROTIME in the last 168 hours.  Cardiac Enzymes: No results for input(s): CKTOTAL, CKMB, CKMBINDEX, TROPONINI in the last 168 hours.  BNP (last 3 results) No results for input(s): PROBNP in the last 8760 hours. HbA1C: No results for input(s): HGBA1C in the last 72 hours.  CBG: Recent Labs  Lab 03/31/21 1605 03/31/21 2153 03/31/21 2343 04/01/21 0324 04/01/21 0844  GLUCAP 118* 142* 167* 97 97   Lipid Profile: No results for input(s): CHOL, HDL, LDLCALC, TRIG, CHOLHDL, LDLDIRECT in the last 72 hours.  Thyroid Function Tests: No results for input(s): TSH, T4TOTAL, FREET4, T3FREE, THYROIDAB in the last 72 hours. Anemia Panel: No results for input(s): VITAMINB12, FOLATE, FERRITIN, TIBC, IRON, RETICCTPCT in the last 72 hours. Sepsis Labs: No results for input(s): PROCALCITON, LATICACIDVEN in the last 168 hours.   Recent Results (from the past 240 hour(s))  Resp Panel by RT-PCR (Flu A&B, Covid) Nasopharyngeal Swab     Status: None  Collection Time: 03/23/21  4:46 PM   Specimen: Nasopharyngeal Swab; Nasopharyngeal(NP) swabs in vial transport medium  Result Value Ref Range Status   SARS Coronavirus 2 by RT PCR NEGATIVE NEGATIVE Final    Comment: (NOTE) SARS-CoV-2 target nucleic acids are NOT DETECTED.  The SARS-CoV-2 RNA is generally detectable in upper respiratory specimens during the acute phase of infection. The lowest concentration of SARS-CoV-2 viral copies this assay can detect is 138 copies/mL. A negative result does not preclude SARS-Cov-2 infection and should not be used as the sole basis for treatment or other patient management decisions. A negative result may occur with  improper specimen collection/handling, submission of specimen other than nasopharyngeal swab, presence of viral mutation(s) within the areas  targeted by this assay, and inadequate number of viral copies(<138 copies/mL). A negative result must be combined with clinical observations, patient history, and epidemiological information. The expected result is Negative.  Fact Sheet for Patients:  EntrepreneurPulse.com.au  Fact Sheet for Healthcare Providers:  IncredibleEmployment.be  This test is no t yet approved or cleared by the [place] FDA and  has been authorized for detection and/or diagnosis of SARS-CoV-2 by FDA under an Emergency Use Authorization (EUA). This EUA will remain  in effect (meaning this test can be used) for the duration of the COVID-19 declaration under Section 564(b)(1) of the Act, 21 U.S.C.section 360bbb-3(b)(1), unless the authorization is terminated  or revoked sooner.       Influenza A by PCR NEGATIVE NEGATIVE Final   Influenza B by PCR NEGATIVE NEGATIVE Final    Comment: (NOTE) The Xpert Xpress SARS-CoV-2/FLU/RSV plus assay is intended as an aid in the diagnosis of influenza from Nasopharyngeal swab specimens and should not be used as a sole basis for treatment. Nasal washings and aspirates are unacceptable for Xpert Xpress SARS-CoV-2/FLU/RSV testing.  Fact Sheet for Patients: EntrepreneurPulse.com.au  Fact Sheet for Healthcare Providers: IncredibleEmployment.be  This test is not yet approved or cleared by the [place] FDA and has been authorized for detection and/or diagnosis of SARS-CoV-2 by FDA under an Emergency Use Authorization (EUA). This EUA will remain in effect (meaning this test can be used) for the duration of the COVID-19 declaration under Section 564(b)(1) of the Act, 21 U.S.C. section 360bbb-3(b)(1), unless the authorization is terminated or revoked.  Performed at [facility], [place] [address], [place], [place] [address]   MRSA Next Gen by PCR, Nasal     Status: None   Collection Time:  03/23/21  8:11 PM   Specimen: Nasal Mucosa; Nasal Swab  Result Value Ref Range Status   MRSA by PCR Next Gen NOT DETECTED NOT DETECTED Final    Comment: (NOTE) The GeneXpert MRSA Assay (FDA approved for NASAL specimens only), is one component of a comprehensive MRSA colonization surveillance program. It is not intended to diagnose MRSA infection nor to guide or monitor treatment for MRSA infections. Test performance is not FDA approved in patients less than 1 years old. Performed at [facility], [place] [address], [place],  [address]   Resp Panel by RT-PCR (Flu A&B, Covid) Nasopharyngeal Swab     Status: Abnormal   Collection Time: 03/30/21  3:57 PM   Specimen: Nasopharyngeal Swab; Nasopharyngeal(NP) swabs in vial transport medium  Result Value Ref Range Status   SARS Coronavirus 2 by RT PCR POSITIVE (A) NEGATIVE Final    Comment: (NOTE) SARS-CoV-2 target nucleic acids are DETECTED.  The SARS-CoV-2 RNA is generally detectable in upper respiratory specimens during  the acute phase of infection. Positive results are indicative of the presence of the identified virus, but do not rule out bacterial infection or co-infection with other pathogens not detected by the test. Clinical correlation with patient history and other diagnostic information is necessary to determine patient infection status. The expected result is Negative.  Fact Sheet for Patients: EntrepreneurPulse.com.au  Fact Sheet for Healthcare Providers: IncredibleEmployment.be  This test is not yet approved or cleared by the [place] FDA and  has been authorized for detection and/or diagnosis of SARS-CoV-2 by FDA under an Emergency Use Authorization (EUA).  This EUA will remain in effect (meaning this test can be used) for the duration of  the COVID-19 declaration under Section 564(b)(1) of the A ct, 21 U.S.C. section 360bbb-3(b)(1), unless the authorization  is terminated or revoked sooner.     Influenza A by PCR NEGATIVE NEGATIVE Final   Influenza B by PCR NEGATIVE NEGATIVE Final    Comment: (NOTE) The Xpert Xpress SARS-CoV-2/FLU/RSV plus assay is intended as an aid in the diagnosis of influenza from Nasopharyngeal swab specimens and should not be used as a sole basis for treatment. Nasal washings and aspirates are unacceptable for Xpert Xpress SARS-CoV-2/FLU/RSV testing.  Fact Sheet for Patients: EntrepreneurPulse.com.au  Fact Sheet for Healthcare Providers: IncredibleEmployment.be  This test is not yet approved or cleared by the [place] FDA and has been authorized for detection and/or diagnosis of SARS-CoV-2 by FDA under an Emergency Use Authorization (EUA). This EUA will remain in effect (meaning this test can be used) for the duration of the COVID-19 declaration under Section 564(b)(1) of the Act, 21 U.S.C. section 360bbb-3(b)(1), unless the authorization is terminated or revoked.  Performed at [facility], [place] [address], [place],  [address]   Resp Panel by RT-PCR (Flu A&B, Covid) Nasopharyngeal Swab     Status: Abnormal   Collection Time: 03/31/21  1:01 PM   Specimen: Nasopharyngeal Swab; Nasopharyngeal(NP) swabs in vial transport medium  Result Value Ref Range Status   SARS Coronavirus 2 by RT PCR POSITIVE (A) NEGATIVE Final    Comment: (NOTE) SARS-CoV-2 target nucleic acids are DETECTED.  The SARS-CoV-2 RNA is generally detectable in upper respiratory specimens during the acute phase of infection. Positive results are indicative of the presence of the identified virus, but do not rule out bacterial infection or co-infection with other pathogens not detected by the test. Clinical correlation with patient history and other diagnostic information is necessary to determine patient infection status. The expected result is Negative.  Fact Sheet for  Patients: EntrepreneurPulse.com.au  Fact Sheet for Healthcare Providers: IncredibleEmployment.be  This test is not yet approved or cleared by the [place] FDA and  has been authorized for detection and/or diagnosis of SARS-CoV-2 by FDA under an Emergency Use Authorization (EUA).  This EUA will remain in effect (meaning this test can be used) for the duration of  the COVID-19 declaration under Section 564(b)(1) of the A ct, 21 U.S.C. section 360bbb-3(b)(1), unless the authorization is terminated or revoked sooner.     Influenza A by PCR NEGATIVE NEGATIVE Final   Influenza B by PCR NEGATIVE NEGATIVE Final    Comment: (NOTE) The Xpert Xpress SARS-CoV-2/FLU/RSV plus assay is intended as an aid in the diagnosis of influenza from Nasopharyngeal swab specimens and should not be used as a sole basis for treatment. Nasal washings and aspirates are unacceptable for Xpert Xpress SARS-CoV-2/FLU/RSV testing.  Fact Sheet for Patients: EntrepreneurPulse.com.au  Fact Sheet for Healthcare Providers:  IncredibleEmployment.be  This test is not yet approved or cleared by the [place] and has been authorized for detection and/or diagnosis of SARS-CoV-2 by FDA under an Emergency Use Authorization (EUA). This EUA will remain in effect (meaning this test can be used) for the duration of the COVID-19 declaration under Section 564(b)(1) of the Act, 21 U.S.C. section 360bbb-3(b)(1), unless the authorization is terminated or revoked.  Performed at [facility], [place] [address], [place], [place] [address]          Radiology Studies: DG CHEST PORT 1 VIEW  Result Date: 03/31/2021 CLINICAL DATA:  COVID-19 EXAM: PORTABLE CHEST 1 VIEW COMPARISON:  03/27/2021 FINDINGS: Heart is upper limits normal in size. Lungs clear. No effusions. No acute bony abnormality. IMPRESSION: No active disease. Electronically Signed    By: Rolm Baptise M.D.   On: 03/31/2021 12:03        Scheduled Meds:  amLODipine  10 mg Oral Daily   enoxaparin (LOVENOX) injection  40 mg Subcutaneous Daily   feeding supplement  237 mL Oral TID BM   folic acid  1 mg Oral Daily   levETIRAcetam  500 mg Oral BID   metoprolol tartrate  25 mg Oral BID   multivitamin with minerals  1 tablet Oral Daily   thiamine  100 mg Oral Daily   Continuous Infusions:  sodium chloride Stopped (03/26/21 0155)     LOS: 9 days    Time spent: 25 mins.More than 50% of that time was spent in counseling and/or coordination of care.      Kayleen Memos, MD [facility] P12/22/2022, 11:37 AM

## 2021-04-02 DIAGNOSIS — R569 Unspecified convulsions: Secondary | ICD-10-CM | POA: Diagnosis not present

## 2021-04-02 LAB — GLUCOSE, CAPILLARY
Glucose-Capillary: 98 mg/dL (ref 70–99)
Glucose-Capillary: 91 mg/dL (ref 70–99)
Glucose-Capillary: 138 mg/dL — ABNORMAL HIGH (ref 70–99)
Glucose-Capillary: 100 mg/dL — ABNORMAL HIGH (ref 70–99)

## 2021-04-02 MED ORDER — BENZONATATE 100 MG PO CAPS
200.0000 mg | ORAL_CAPSULE | Freq: Three times a day (TID) | ORAL | Status: AC
  Administered 2021-04-02 – 2021-04-04 (×8): 200 mg via ORAL
  Filled 2021-04-02 (×8): qty 2

## 2021-04-02 NOTE — Progress Notes (Signed)
Physical Therapy Treatment Patient Details Name: Dennis Levine MRN: [id number] DOB: [birth year] Today's Date: 04/02/2021   History of Present Illness 70 yo admitted 12/13 after found unresponsive in bathroom with witnessed seizure and intubated 12/13-12/14. Head CT (-), MRI demonstrated Rt meningioma. PMhx: ETOH abuse, HTN, hep C    PT Comments    Pt received in supine, alert and agreeable to therapy session and with good participation in seated/standing exercises and gait progression with RW. Pt with poor carryover of cues for safe hand placement with transfers and needs up to minA for gait and transfers when pushing on knees to stand. Pt making good progress toward goals, will plan to initiate step-ups in room next session for BLE strengthening. Pt continues to benefit from PT services to progress toward functional mobility goals.    Recommendations for follow up therapy are one component of a multi-disciplinary discharge planning process, led by the attending physician.  Recommendations may be updated based on patient status, additional functional criteria and insurance authorization.  Follow Up Recommendations  Skilled nursing-short term rehab (<3 hours/day)     Assistance Recommended at Discharge Frequent or constant Supervision/Assistance  Equipment Recommendations  Rolling walker (2 wheels);BSC/3in1    Recommendations for Other Services       Precautions / Restrictions Precautions Precautions: Fall Precaution Comments: Covid+ Air/Contact Restrictions Weight Bearing Restrictions: No     Mobility  Bed Mobility Overal bed mobility: Modified Independent             General bed mobility comments: No difficulties    Transfers Overall transfer level: Needs assistance Equipment used: Rolling walker (2 wheels) Transfers: Sit to/from Stand Sit to Stand: Min guard           General transfer comment: Min guard for balance and safety, pt tending to pull up on RW but  will push from bed with reciprocal transfers and mod cues    Ambulation/Gait Ambulation/Gait assistance: Min assist Gait Distance (Feet): 70 Feet (77ft, seated break, 53ft) Assistive device: Rolling walker (2 wheels) Gait Pattern/deviations: Step-through pattern;Decreased stride length;Drifts right/left;Narrow base of support;Trunk flexed   Gait velocity interpretation: <1.31 ft/sec, indicative of household ambulator   General Gait Details: pt reliant on RW, needs up to minA when turning and to manage RW, when cued to attempt marching/high knees for strengthening and balance challenge he is able to perform but needs minA for stability   Stairs             Wheelchair Mobility    Modified Rankin (Stroke Patients Only)       Balance Overall balance assessment: Needs assistance Sitting-balance support: Feet supported Sitting balance-Leahy Scale: Fair Sitting balance - Comments: with seated marching, pt with posterior lean/LOB and needs cues for safety and self-support   Standing balance support: No upper extremity supported;Single extremity supported;During functional activity Standing balance-Leahy Scale: Fair Standing balance comment: up to minA with RW support                            Cognition Arousal/Alertness: Awake/alert Behavior During Therapy: WFL for tasks assessed/performed;Impulsive Overall Cognitive Status: Impaired/Different from baseline Area of Impairment: Following commands;Safety/judgement;Attention;Problem solving                   Current Attention Level: Sustained   Following Commands: Follows one step commands consistently Safety/Judgement: Decreased awareness of safety;Decreased awareness of deficits   Problem Solving: Slow processing;Requires verbal cues General Comments: Pt  alert to self/situation, at times needs increased time to follow commands. Pt with poor carryover of safety cues w/r/t transfer and RW use. Pt  impulsively standing at EOB prior to PTA having chair ready but does follow instructions for sitting back down at EOB.        Exercises Other Exercises Other Exercises: seated BLE AROM: marching, LAQ x10 reps Other Exercises: STS x 5 reciprocal reps    General Comments General comments (skin integrity, edema, etc.): VSS on RA, BP also taken post-exertion and WFL      Pertinent Vitals/Pain Pain Assessment: No/denies pain    Home Living                          Prior Function            PT Goals (current goals can now be found in the care plan section) Acute Rehab PT Goals Patient Stated Goal: to get better and go to rehab PT Goal Formulation: With patient Time For Goal Achievement: 04/09/21 Progress towards PT goals: Progressing toward goals    Frequency    Min 3X/week      PT Plan Current plan remains appropriate    Co-evaluation              AM-PAC PT "6 Clicks" Mobility   Outcome Measure  Help needed turning from your back to your side while in a flat bed without using bedrails?: None Help needed moving from lying on your back to sitting on the side of a flat bed without using bedrails?: None Help needed moving to and from a bed to a chair (including a wheelchair)?: A Lot (mod cues) Help needed standing up from a chair using your arms (e.g., wheelchair or bedside chair)?: A Little Help needed to walk in hospital room?: A Lot (mod cues) Help needed climbing 3-5 steps with a railing? : Total 6 Click Score: 16    End of Session Equipment Utilized During Treatment: Gait belt Activity Tolerance: Patient tolerated treatment well Patient left: in chair;with call bell/phone within reach;with chair alarm set;Other (comment) (pt heels floated, reinforced use of call bell) Nurse Communication: Mobility status PT Visit Diagnosis: Other abnormalities of gait and mobility (R26.89);Difficulty in walking, not elsewhere classified (R26.2);Muscle weakness  (generalized) (M62.81);Unsteadiness on feet (R26.81)     Time: 1438-1500 PT Time Calculation (min) (ACUTE ONLY): 22 min  Charges:  $Gait Training: 8-22 mins                     Coran Dipaola P., PTA Acute Rehabilitation Services Pager: [phone] Office: [place] 04/02/2021, 4:53 PM

## 2021-04-02 NOTE — Progress Notes (Signed)
PROGRESS NOTE    RONTE PARKER  DVV:[id number] DOB: 09-30-1950 DOA: 03/23/2021 PCP: Pcp, No   Chief Complain: Seizure  Brief Narrative:  Patient is a 70 year old male with history of prostate cancer, hepatitis C, hypertension, alcohol use who was found down at home by family on 12/13.  He was observed to be seizing during transfer to ER via EMS.  Code stroke was called.  On presentation he was hypoxic, tachypneic.  Patient was intubated in the ED for concern of ongoing seizure, airway protection, admitted under PCCM service.  Neurology was consulted.  Transferred to [facility] service on 12/16.  Hospital course remarkable for persistent confusion/agitation most likely secondary to alcohol withdrawal.  Respiratory status, mental status has improved and currently at baseline.    PT/OT recommended skilled nursing facility on discharge, was ready for discharge but his COVID test incidentally came out to be positive on 03/30/2021.  Discharge prolonged due to isolation status.  TOC following.  With 10 days isolation will be ready for dc to SNF on 04/10/21.  04/02/21: Patient seen at his bedside.  No acute events overnight.  He has no new complaints.  Plan to discharge to SNF post 10 days of isolation due to positive COVID-19 screening test.  Assessment & Plan:   Principal Problem:   Seizures ([place]) Active Problems:   Protein-calorie malnutrition, severe   New onset seizures: Presented with episode of unresponsiveness, left word gaze, seizures followed by left-sided weakness.  MRI of the brain did not show any acute infarct or intracranial process, no definitive etiology for seizure.  But showed dural based mass along the right bilateral convexity consistent with meningioma.  EEG showed epileptogenicity from the right frontal region but no active seizures.  Seen by neurology.  Currently on Keppra.  Will need to follow-up with neurology as an outpatient.  Neurology thought that alcohol could be the  etiology for his seizure.  Currently back to his baseline mentation.  Meningioma: Described as above.    He needs to follow-up with neurosurgery as an outpatient.  No signal change in the brain parenchyma adjacent to the meningioma making it less likely the cause for his seizure.  Resolved acute hypoxic respiratory failure required intubation:  Intubated on 12/13 for airway protection.  Extubated on 12/14.  Chest x-ray done on 12/17 showed mild vascular congestion with no focal infiltrate.  Currently on room air with oxygen saturation of 98%.  Chronic diastolic congestive heart failure:  Currently euvolemic.  Echo on 03/24/21 showed EF of 65 to 62%, grade 1 diastolic function. Strict I's and O's and daily weight Net I&O -1.3 L since admission  Hypertension, stable, at goal:  Takes amlodipine at home, continue Lopressor 25 mg twice daily, started here in the hospital. Continue to monitor vital signs  Post ictal state/delirium:  Hospital course remarkable for delirium.  Was given Precedex in the ICU.  He was on Zyprexa which was stopped due to persistent sleepiness/drowsiness.   Mental status is back to baseline on Keppra.  Hepatitis C/prostate cancer:  Management as outpatient.  Alcohol withdrawal/alcohol abuse:  Drinks 6 packs of beer every day.  Counseled for cessation.  Continue multivitamin, thiamine and folic acid.  Given high-dose thiamine.  He was on CIWA protocol, also on phenobarbital, stopped  Debility/deconditioning:  PT/OT recommending skilled nursing facility on discharge. TOC assisting with SNF placement  Incidental COVID positive: COVID screen test that was done for preparation for  SNF discharge came out to be positive.  Oxygen saturation 98 to 100% room air.  Chest x-ray does not show any infiltrate.   Continue supportive care.    Nutrition Problem: Severe Malnutrition Etiology: chronic illness (h/o cancer, hep C)      DVT prophylaxis:Lovenox subcu daily. Code  Status: Full Family Communication: None at bedside.    Patient status:Inpatient  Dispo: The patient is from: Home              Anticipated d/c is to: SNF              Anticipated d/c date is: Prolonged due to isolation status for COVID.  Consultants: PCCM, neurology  Procedures: Intubation, EEG  Antimicrobials:  Anti-infectives (From admission, onward)    None        Objective: Vitals:   04/01/21 2357 04/02/21 0342 04/02/21 0421 04/02/21 0754  BP: 109/68 (!) 106/59  121/72  Pulse: 73 76  74  Resp: 18 17  18   Temp: 98.3 F (36.8 C) 98 F (36.7 C)  98.9 F (37.2 C)  TempSrc: Oral   Oral  SpO2: 99% 98%  98%  Weight:   63.5 kg   Height:        Intake/Output Summary (Last 24 hours) at 04/02/2021 1128 Last data filed at 04/02/2021 0754 Gross per 24 hour  Intake --  Output 900 ml  Net -900 ml   Filed Weights   03/29/21 0711 03/30/21 0325 04/02/21 0421  Weight: 62.8 kg 62.3 kg 63.5 kg    Examination:  General exam: Frail-appearing no acute distress.  He is alert and oriented x3.  Hard of hearing.   HEENT: PERRL Respiratory system: Clear to auscultation no wheezes or rales.  Poor inspiratory effort.   Cardiovascular system: Regular rate and rhythm no rubs or gallops. Gastrointestinal system: Nondistended nontender bowel sounds present.   Central nervous system: Alert and oriented x3.  Moves all 4 extremities.   Extremities: No edema in lower extremities bilaterally. Skin: No rashes or ulcerative lesions noted. Psych: Mood is appropriate for condition and setting.  Data Reviewed: I have personally reviewed following labs and imaging studies  CBC: Recent Labs  Lab 03/27/21 0224 03/28/21 0703 03/29/21 0347  WBC 7.1 5.8 5.3  HGB 13.6 14.3 13.6  HCT 41.8 43.7 41.9  MCV 87.1 86.2 87.5  PLT 196 207 361   Basic Metabolic Panel: Recent Labs  Lab 03/27/21 0224 03/28/21 0703 03/29/21 0347  NA 135 134* 133*  K 3.7 4.1 4.0  CL 102 101 102  CO2 26 24 24    GLUCOSE 115* 108* 89  BUN 5* 13 18  CREATININE 0.90 0.79 0.79  CALCIUM 8.4* 9.0 8.4*   GFR: Estimated Creatinine Clearance: 76.1 mL/min (by C-G formula based on SCr of 0.79 mg/dL). Liver Function Tests: No results for input(s): AST, ALT, ALKPHOS, BILITOT, PROT, ALBUMIN in the last 168 hours.  No results for input(s): LIPASE, AMYLASE in the last 168 hours. No results for input(s): AMMONIA in the last 168 hours. Coagulation Profile: No results for input(s): INR, PROTIME in the last 168 hours.  Cardiac Enzymes: No results for input(s): CKTOTAL, CKMB, CKMBINDEX, TROPONINI in the last 168 hours.  BNP (last 3 results) No results for input(s): PROBNP in the last 8760 hours. HbA1C: No results for input(s): HGBA1C in the last 72 hours.  CBG: Recent Labs  Lab 04/01/21 1338 04/01/21 1820 04/01/21 2009 04/01/21 2356 04/02/21 0340  GLUCAP 99 110* 137* 161* 98   Lipid Profile: No results for input(s): CHOL,  HDL, LDLCALC, TRIG, CHOLHDL, LDLDIRECT in the last 72 hours.  Thyroid Function Tests: No results for input(s): TSH, T4TOTAL, FREET4, T3FREE, THYROIDAB in the last 72 hours. Anemia Panel: No results for input(s): VITAMINB12, FOLATE, FERRITIN, TIBC, IRON, RETICCTPCT in the last 72 hours. Sepsis Labs: No results for input(s): PROCALCITON, LATICACIDVEN in the last 168 hours.   Recent Results (from the past 240 hour(s))  Resp Panel by RT-PCR (Flu A&B, Covid) Nasopharyngeal Swab     Status: None   Collection Time: 03/23/21  4:46 PM   Specimen: Nasopharyngeal Swab; Nasopharyngeal(NP) swabs in vial transport medium  Result Value Ref Range Status   SARS Coronavirus 2 by RT PCR NEGATIVE NEGATIVE Final    Comment: (NOTE) SARS-CoV-2 target nucleic acids are NOT DETECTED.  The SARS-CoV-2 RNA is generally detectable in upper respiratory specimens during the acute phase of infection. The lowest concentration of SARS-CoV-2 viral copies this assay can detect is 138 copies/mL. A negative  result does not preclude SARS-Cov-2 infection and should not be used as the sole basis for treatment or other patient management decisions. A negative result may occur with  improper specimen collection/handling, submission of specimen other than nasopharyngeal swab, presence of viral mutation(s) within the areas targeted by this assay, and inadequate number of viral copies(<138 copies/mL). A negative result must be combined with clinical observations, patient history, and epidemiological information. The expected result is Negative.  Fact Sheet for Patients:  EntrepreneurPulse.com.au  Fact Sheet for Healthcare Providers:  IncredibleEmployment.be  This test is no t yet approved or cleared by the [place] FDA and  has been authorized for detection and/or diagnosis of SARS-CoV-2 by FDA under an Emergency Use Authorization (EUA). This EUA will remain  in effect (meaning this test can be used) for the duration of the COVID-19 declaration under Section 564(b)(1) of the Act, 21 U.S.C.section 360bbb-3(b)(1), unless the authorization is terminated  or revoked sooner.       Influenza A by PCR NEGATIVE NEGATIVE Final   Influenza B by PCR NEGATIVE NEGATIVE Final    Comment: (NOTE) The Xpert Xpress SARS-CoV-2/FLU/RSV plus assay is intended as an aid in the diagnosis of influenza from Nasopharyngeal swab specimens and should not be used as a sole basis for treatment. Nasal washings and aspirates are unacceptable for Xpert Xpress SARS-CoV-2/FLU/RSV testing.  Fact Sheet for Patients: EntrepreneurPulse.com.au  Fact Sheet for Healthcare Providers: IncredibleEmployment.be  This test is not yet approved or cleared by the [place] FDA and has been authorized for detection and/or diagnosis of SARS-CoV-2 by FDA under an Emergency Use Authorization (EUA). This EUA will remain in effect (meaning this test can be used)  for the duration of the COVID-19 declaration under Section 564(b)(1) of the Act, 21 U.S.C. section 360bbb-3(b)(1), unless the authorization is terminated or revoked.  Performed at [facility], [place] [address] [place], [place] [address]   MRSA Next Gen by PCR, Nasal     Status: None   Collection Time: 03/23/21  8:11 PM   Specimen: Nasal Mucosa; Nasal Swab  Result Value Ref Range Status   MRSA by PCR Next Gen NOT DETECTED NOT DETECTED Final    Comment: (NOTE) The GeneXpert MRSA Assay (FDA approved for NASAL specimens only), is one component of a comprehensive MRSA colonization surveillance program. It is not intended to diagnose MRSA infection nor to guide or monitor treatment for MRSA infections. Test performance is not FDA approved in patients less than 42 years old. Performed at [facility], [address] [place], [place] [address]   Resp Panel by RT-PCR (Flu A&B, Covid) Nasopharyngeal Swab     Status: Abnormal   Collection Time: 03/30/21  3:57 PM   Specimen: Nasopharyngeal Swab; Nasopharyngeal(NP) swabs in vial transport medium  Result Value Ref Range Status   SARS Coronavirus 2 by RT PCR POSITIVE (A) NEGATIVE Final    Comment: (NOTE) SARS-CoV-2 target nucleic acids are DETECTED.  The SARS-CoV-2 RNA is generally detectable in upper respiratory specimens during the acute phase of infection. Positive results are indicative of the presence of the identified virus, but do not rule out bacterial infection or co-infection with other pathogens not detected by the test. Clinical correlation with patient history and other diagnostic information is necessary to determine patient infection status. The expected result is Negative.  Fact Sheet for Patients: EntrepreneurPulse.com.au  Fact Sheet for Healthcare Providers: IncredibleEmployment.be  This test is not yet approved or cleared by the [place] FDA and  has been authorized  for detection and/or diagnosis of SARS-CoV-2 by FDA under an Emergency Use Authorization (EUA).  This EUA will remain in effect (meaning this test can be used) for the duration of  the COVID-19 declaration under Section 564(b)(1) of the A ct, 21 U.S.C. section 360bbb-3(b)(1), unless the authorization is terminated or revoked sooner.     Influenza A by PCR NEGATIVE NEGATIVE Final   Influenza B by PCR NEGATIVE NEGATIVE Final    Comment: (NOTE) The Xpert Xpress SARS-CoV-2/FLU/RSV plus assay is intended as an aid in the diagnosis of influenza from Nasopharyngeal swab specimens and should not be used as a sole basis for treatment. Nasal washings and aspirates are unacceptable for Xpert Xpress SARS-CoV-2/FLU/RSV testing.  Fact Sheet for Patients: EntrepreneurPulse.com.au  Fact Sheet for Healthcare Providers: IncredibleEmployment.be  This test is not yet approved or cleared by the [place] FDA and has been authorized for detection and/or diagnosis of SARS-CoV-2 by FDA under an Emergency Use Authorization (EUA). This EUA will remain in effect (meaning this test can be used) for the duration of the COVID-19 declaration under Section 564(b)(1) of the Act, 21 U.S.C. section 360bbb-3(b)(1), unless the authorization is terminated or revoked.  Performed at [facility], [place] [address], [place], [place] [address]   Resp Panel by RT-PCR (Flu A&B, Covid) Nasopharyngeal Swab     Status: Abnormal   Collection Time: 03/31/21  1:01 PM   Specimen: Nasopharyngeal Swab; Nasopharyngeal(NP) swabs in vial transport medium  Result Value Ref Range Status   SARS Coronavirus 2 by RT PCR POSITIVE (A) NEGATIVE Final    Comment: (NOTE) SARS-CoV-2 target nucleic acids are DETECTED.  The SARS-CoV-2 RNA is generally detectable in upper respiratory specimens during the acute phase of infection. Positive results are indicative of the presence of the identified  virus, but do not rule out bacterial infection or co-infection with other pathogens not detected by the test. Clinical correlation with patient history and other diagnostic information is necessary to determine patient infection status. The expected result is Negative.  Fact Sheet for Patients: EntrepreneurPulse.com.au  Fact Sheet for Healthcare Providers: IncredibleEmployment.be  This test is not yet approved or cleared by the [place] FDA and  has been authorized for detection and/or diagnosis of SARS-CoV-2 by FDA under an Emergency Use Authorization (EUA).  This EUA will remain in effect (meaning this test can be used) for the duration of  the COVID-19 declaration under Section 564(b)(1) of the A ct, 21 U.S.C. section 360bbb-3(b)(1), unless the  authorization is terminated or revoked sooner.     Influenza A by PCR NEGATIVE NEGATIVE Final   Influenza B by PCR NEGATIVE NEGATIVE Final    Comment: (NOTE) The Xpert Xpress SARS-CoV-2/FLU/RSV plus assay is intended as an aid in the diagnosis of influenza from Nasopharyngeal swab specimens and should not be used as a sole basis for treatment. Nasal washings and aspirates are unacceptable for Xpert Xpress SARS-CoV-2/FLU/RSV testing.  Fact Sheet for Patients: EntrepreneurPulse.com.au  Fact Sheet for Healthcare Providers: IncredibleEmployment.be  This test is not yet approved or cleared by the [place] FDA and has been authorized for detection and/or diagnosis of SARS-CoV-2 by FDA under an Emergency Use Authorization (EUA). This EUA will remain in effect (meaning this test can be used) for the duration of the COVID-19 declaration under Section 564(b)(1) of the Act, 21 U.S.C. section 360bbb-3(b)(1), unless the authorization is terminated or revoked.  Performed at [facility], [place] [address] [place], [place] [address]          Radiology  Studies: No results found.      Scheduled Meds:  amLODipine  10 mg Oral Daily   enoxaparin (LOVENOX) injection  40 mg Subcutaneous Daily   feeding supplement  237 mL Oral TID BM   folic acid  1 mg Oral Daily   levETIRAcetam  500 mg Oral BID   metoprolol tartrate  25 mg Oral BID   multivitamin with minerals  1 tablet Oral Daily   thiamine  100 mg Oral Daily   Continuous Infusions:  sodium chloride Stopped (03/26/21 0155)     LOS: 10 days    Time spent: 25 mins.More than 50% of that time was spent in counseling and/or coordination of care.      Kayleen Memos, MD [facility] P12/23/2022, 11:28 AM

## 2021-04-03 DIAGNOSIS — R569 Unspecified convulsions: Secondary | ICD-10-CM | POA: Diagnosis not present

## 2021-04-03 LAB — GLUCOSE, CAPILLARY
Glucose-Capillary: 101 mg/dL — ABNORMAL HIGH (ref 70–99)
Glucose-Capillary: 107 mg/dL — ABNORMAL HIGH (ref 70–99)
Glucose-Capillary: 151 mg/dL — ABNORMAL HIGH (ref 70–99)

## 2021-04-03 NOTE — Progress Notes (Signed)
PROGRESS NOTE    Dennis Levine  ZES:[id number] DOB: [birth year] DOA: 03/23/2021 PCP: Pcp, No   Brief Narrative:  Patient is a 70 year old male with history of prostate cancer, hepatitis C, hypertension, alcohol use who was found down at home by family on 12/13.  He was observed to be seizing during transfer to ER via EMS.  Code stroke was called.  On presentation he was hypoxic, tachypneic.  Patient was intubated in the ED for concern of ongoing seizure, airway protection, admitted under PCCM service.  Neurology was consulted.  Transferred to [facility] service on 12/16.  Hospital course remarkable for persistent confusion/agitation most likely secondary to alcohol withdrawal.  Respiratory status, mental status has improved and currently at baseline.     PT/OT recommended skilled nursing facility on discharge, was ready for discharge but his COVID test incidentally came out to be positive on 03/30/2021.  Discharge prolonged due to isolation status.  TOC following.  With 10 days isolation will be ready for dc to SNF on 04/10/21.  Assessment & Plan:  New onset seizures:  -Presented with episode of unresponsiveness, left word gaze, seizures followed by left-sided weakness.   -MRI of the brain did not show any acute infarct or intracranial process, no definitive etiology for seizure.  But showed dural based mass along the right bilateral convexity consistent with meningioma.   -EEG showed epileptogenicity from the right frontal region but no active seizures.   -Seen by neurology.  Currently on Keppra.  Will need to follow-up with neurology as an outpatient.   -Neurology thought that alcohol could be the etiology for his seizure.  Currently back to his baseline mentation.   Meningioma:  -He needs to follow-up with neurosurgery as an outpatient.   -No signal change in the brain parenchyma adjacent to the meningioma making it less likely the cause for his seizure.   Resolved acute hypoxic respiratory  failure required intubation:  -Intubated on 12/13 for airway protection.  Extubated on 12/14.  Chest x-ray done on 12/17 showed mild vascular congestion with no focal infiltrate.   -Currently on room air with oxygen saturation of 98%.   Chronic diastolic congestive heart failure:  -Currently euvolemic.  Echo on 03/24/21 showed EF of 65 to 26%, grade 1 diastolic function. -Strict I's and O's and daily weight   Hypertension, stable, at goal:  -Takes amlodipine at home, continue Lopressor 25 mg twice daily, started here in the hospital. -Continue to monitor vital signs   Post ictal state/delirium:  [facility] course remarkable for delirium.  Was given Precedex in the ICU.  He was on Zyprexa which was stopped due to persistent sleepiness/drowsiness.   -Mental status is back to baseline on Keppra.   Hepatitis C/prostate cancer:  -Management as outpatient.   Alcohol withdrawal/alcohol abuse:  -Drinks 6 packs of beer every day.  Counseled for cessation.  Continue multivitamin, thiamine and folic acid.  Given high-dose thiamine.  He was on CIWA protocol, also on phenobarbital, stopped   Debility/deconditioning:  -PT/OT recommending skilled nursing facility on discharge. -TOC assisting with SNF placement   Incidental COVID positive:  -COVID screen test that was done for preparation for  SNF discharge came out to be positive. --Oxygen saturation 98 to 100% room air.  Chest x-ray does not show any infiltrate.   -Continue supportive care.     Nutrition Problem: Severe Malnutrition Etiology: chronic illness (h/o cancer, hep C)   DVT prophylaxis: Lovenox Code Status: Full code Family Communication:  None present at  bedside.  Plan of care discussed with patient in length and he verbalized understanding and agreed with it. Disposition Plan: SNF  Consultants:  Neurology Neurosurgery PCCM  Procedures:  Intubation EEG   Antimicrobials: None  Status is:  Inpatient    Subjective: Patient seen and examined.  Sitting comfortably on the bed and eating breakfast.  Tells me that he is doing fine, no new complaints.  Denies cough, congestion, chest pain, shortness of breath, headache, fever, chills.  No acute events overnight. Objective: Vitals:   04/02/21 2012 04/03/21 0020 04/03/21 0743 04/03/21 1139  BP: 125/69 118/82 (!) 141/85 127/84  Pulse: 76 72 70 73  Resp: 18 16 18 18   Temp: 99 F (37.2 C) 98.5 F (36.9 C) 98.2 F (36.8 C) 97.7 F (36.5 C)  TempSrc: Oral Oral Oral Oral  SpO2: 100% 98% 99% 100%  Weight:      Height:        Intake/Output Summary (Last 24 hours) at 04/03/2021 1238 Last data filed at 04/03/2021 0700 Gross per 24 hour  Intake --  Output 700 ml  Net -700 ml   Filed Weights   03/29/21 0711 03/30/21 0325 04/02/21 0421  Weight: 62.8 kg 62.3 kg 63.5 kg    Examination:  General exam: Appears calm and comfortable, on room air, eating breakfast, communicating well Respiratory system: Clear to auscultation. Respiratory effort normal. Cardiovascular system: S1 & S2 heard, RRR. No JVD, murmurs, rubs, gallops or clicks. No pedal edema. Gastrointestinal system: Abdomen is nondistended, soft and nontender. No organomegaly or masses felt. Normal bowel sounds heard. Central nervous system: Alert and oriented. No focal neurological deficits. Extremities: Symmetric 5 x 5 power. Skin: No rashes, lesions or ulcers Psychiatry: Judgement and insight appear normal. Mood & affect appropriate.    Data Reviewed: I have personally reviewed following labs and imaging studies  CBC: Recent Labs  Lab 03/28/21 0703 03/29/21 0347  WBC 5.8 5.3  HGB 14.3 13.6  HCT 43.7 41.9  MCV 86.2 87.5  PLT 207 408   Basic Metabolic Panel: Recent Labs  Lab 03/28/21 0703 03/29/21 0347  NA 134* 133*  K 4.1 4.0  CL 101 102  CO2 24 24  GLUCOSE 108* 89  BUN 13 18  CREATININE 0.79 0.79  CALCIUM 9.0 8.4*   GFR: Estimated Creatinine  Clearance: 76.1 mL/min (by C-G formula based on SCr of 0.79 mg/dL). Liver Function Tests: No results for input(s): AST, ALT, ALKPHOS, BILITOT, PROT, ALBUMIN in the last 168 hours. No results for input(s): LIPASE, AMYLASE in the last 168 hours. No results for input(s): AMMONIA in the last 168 hours. Coagulation Profile: No results for input(s): INR, PROTIME in the last 168 hours. Cardiac Enzymes: No results for input(s): CKTOTAL, CKMB, CKMBINDEX, TROPONINI in the last 168 hours. BNP (last 3 results) No results for input(s): PROBNP in the last 8760 hours. HbA1C: No results for input(s): HGBA1C in the last 72 hours. CBG: Recent Labs  Lab 04/02/21 1557 04/02/21 2022 04/03/21 0051 04/03/21 0414 04/03/21 0819  GLUCAP 138* 100* 151* 107* 101*   Lipid Profile: No results for input(s): CHOL, HDL, LDLCALC, TRIG, CHOLHDL, LDLDIRECT in the last 72 hours. Thyroid Function Tests: No results for input(s): TSH, T4TOTAL, FREET4, T3FREE, THYROIDAB in the last 72 hours. Anemia Panel: No results for input(s): VITAMINB12, FOLATE, FERRITIN, TIBC, IRON, RETICCTPCT in the last 72 hours. Sepsis Labs: No results for input(s): PROCALCITON, LATICACIDVEN in the last 168 hours.  Recent Results (from the past 240 hour(s))  Resp  Panel by RT-PCR (Flu A&B, Covid) Nasopharyngeal Swab     Status: Abnormal   Collection Time: 03/30/21  3:57 PM   Specimen: Nasopharyngeal Swab; Nasopharyngeal(NP) swabs in vial transport medium  Result Value Ref Range Status   SARS Coronavirus 2 by RT PCR POSITIVE (A) NEGATIVE Final    Comment: (NOTE) SARS-CoV-2 target nucleic acids are DETECTED.  The SARS-CoV-2 RNA is generally detectable in upper respiratory specimens during the acute phase of infection. Positive results are indicative of the presence of the identified virus, but do not rule out bacterial infection or co-infection with other pathogens not detected by the test. Clinical correlation with patient history  and other diagnostic information is necessary to determine patient infection status. The expected result is Negative.  Fact Sheet for Patients: EntrepreneurPulse.com.au  Fact Sheet for Healthcare Providers: IncredibleEmployment.be  This test is not yet approved or cleared by the [place] FDA and  has been authorized for detection and/or diagnosis of SARS-CoV-2 by FDA under an Emergency Use Authorization (EUA).  This EUA will remain in effect (meaning this test can be used) for the duration of  the COVID-19 declaration under Section 564(b)(1) of the A ct, 21 U.S.C. section 360bbb-3(b)(1), unless the authorization is terminated or revoked sooner.     Influenza A by PCR NEGATIVE NEGATIVE Final   Influenza B by PCR NEGATIVE NEGATIVE Final    Comment: (NOTE) The Xpert Xpress SARS-CoV-2/FLU/RSV plus assay is intended as an aid in the diagnosis of influenza from Nasopharyngeal swab specimens and should not be used as a sole basis for treatment. Nasal washings and aspirates are unacceptable for Xpert Xpress SARS-CoV-2/FLU/RSV testing.  Fact Sheet for Patients: EntrepreneurPulse.com.au  Fact Sheet for Healthcare Providers: IncredibleEmployment.be  This test is not yet approved or cleared by the [place] FDA and has been authorized for detection and/or diagnosis of SARS-CoV-2 by FDA under an Emergency Use Authorization (EUA). This EUA will remain in effect (meaning this test can be used) for the duration of the COVID-19 declaration under Section 564(b)(1) of the Act, 21 U.S.C. section 360bbb-3(b)(1), unless the authorization is terminated or revoked.  Performed at [facility], [place] [address] [place], [place] [address]   Resp Panel by RT-PCR (Flu A&B, Covid) Nasopharyngeal Swab     Status: Abnormal   Collection Time: 03/31/21  1:01 PM   Specimen: Nasopharyngeal Swab; Nasopharyngeal(NP) swabs in  vial transport medium  Result Value Ref Range Status   SARS Coronavirus 2 by RT PCR POSITIVE (A) NEGATIVE Final    Comment: (NOTE) SARS-CoV-2 target nucleic acids are DETECTED.  The SARS-CoV-2 RNA is generally detectable in upper respiratory specimens during the acute phase of infection. Positive results are indicative of the presence of the identified virus, but do not rule out bacterial infection or co-infection with other pathogens not detected by the test. Clinical correlation with patient history and other diagnostic information is necessary to determine patient infection status. The expected result is Negative.  Fact Sheet for Patients: EntrepreneurPulse.com.au  Fact Sheet for Healthcare Providers: IncredibleEmployment.be  This test is not yet approved or cleared by the [place] FDA and  has been authorized for detection and/or diagnosis of SARS-CoV-2 by FDA under an Emergency Use Authorization (EUA).  This EUA will remain in effect (meaning this test can be used) for the duration of  the COVID-19 declaration under Section 564(b)(1) of the A ct, 21 U.S.C. section 360bbb-3(b)(1), unless the authorization is terminated or revoked sooner.     Influenza  A by PCR NEGATIVE NEGATIVE Final   Influenza B by PCR NEGATIVE NEGATIVE Final    Comment: (NOTE) The Xpert Xpress SARS-CoV-2/FLU/RSV plus assay is intended as an aid in the diagnosis of influenza from Nasopharyngeal swab specimens and should not be used as a sole basis for treatment. Nasal washings and aspirates are unacceptable for Xpert Xpress SARS-CoV-2/FLU/RSV testing.  Fact Sheet for Patients: EntrepreneurPulse.com.au  Fact Sheet for Healthcare Providers: IncredibleEmployment.be  This test is not yet approved or cleared by the [place] FDA and has been authorized for detection and/or diagnosis of SARS-CoV-2 by FDA under an Emergency Use  Authorization (EUA). This EUA will remain in effect (meaning this test can be used) for the duration of the COVID-19 declaration under Section 564(b)(1) of the Act, 21 U.S.C. section 360bbb-3(b)(1), unless the authorization is terminated or revoked.  Performed at [facility], [place] [address] [place], [place] [address]       Radiology Studies: No results found.  Scheduled Meds:  amLODipine  10 mg Oral Daily   benzonatate  200 mg Oral TID   enoxaparin (LOVENOX) injection  40 mg Subcutaneous Daily   feeding supplement  237 mL Oral TID BM   folic acid  1 mg Oral Daily   levETIRAcetam  500 mg Oral BID   metoprolol tartrate  25 mg Oral BID   multivitamin with minerals  1 tablet Oral Daily   thiamine  100 mg Oral Daily   Continuous Infusions:  sodium chloride Stopped (03/26/21 0155)     LOS: 11 days   Time spent: 35 minutes   Sanda Dejoy Loann Quill, MD [facility]  If 7PM-7AM, please contact night-coverage [email or web address] 04/03/2021, 12:38 PM

## 2021-04-04 DIAGNOSIS — R569 Unspecified convulsions: Secondary | ICD-10-CM | POA: Diagnosis not present

## 2021-04-04 LAB — GLUCOSE, CAPILLARY: Glucose-Capillary: 156 mg/dL — ABNORMAL HIGH (ref 70–99)

## 2021-04-04 NOTE — Progress Notes (Signed)
PROGRESS NOTE    CATHERINE OAK  MLY:[id number] DOB: 1950-07-06 DOA: 03/23/2021 PCP: Pcp, No   Brief Narrative:  Patient is a 70 year old male with history of prostate cancer, hepatitis C, hypertension, alcohol use who was found down at home by family on 12/13.  He was observed to be seizing during transfer to ER via EMS.  Code stroke was called.  On presentation he was hypoxic, tachypneic.  Patient was intubated in the ED for concern of ongoing seizure, airway protection, admitted under PCCM service.  Neurology was consulted.  Transferred to [facility] service on 12/16.  Hospital course remarkable for persistent confusion/agitation most likely secondary to alcohol withdrawal.  Respiratory status, mental status has improved and currently at baseline.     PT/OT recommended skilled nursing facility on discharge, was ready for discharge but his COVID test incidentally came out to be positive on 03/30/2021.  Discharge prolonged due to isolation status.  TOC following.  With 10 days isolation will be ready for dc to SNF on 04/10/21.  Assessment & Plan:  New onset seizures:  -Presented with episode of unresponsiveness, left word gaze, seizures followed by left-sided weakness.   -MRI of the brain did not show any acute infarct or intracranial process, no definitive etiology for seizure.  But showed dural based mass along the right bilateral convexity consistent with meningioma.   -EEG showed epileptogenicity from the right frontal region but no active seizures.   -Seen by neurology.  Currently on Keppra.  Will need to follow-up with neurology as an outpatient.   -Neurology thought that alcohol could be the etiology for his seizure.  Currently back to his baseline mentation.   Meningioma:  -He needs to follow-up with neurosurgery as an outpatient.   -No signal change in the brain parenchyma adjacent to the meningioma making it less likely the cause for his seizure.   Resolved acute hypoxic respiratory  failure required intubation:  -Intubated on 12/13 for airway protection.  Extubated on 12/14.  Chest x-ray done on 12/17 showed mild vascular congestion with no focal infiltrate.   -Currently on room air with oxygen saturation of 98%.   Chronic diastolic congestive heart failure:  -Currently euvolemic.  Echo on 03/24/21 showed EF of 65 to 81%, grade 1 diastolic function. -Strict I's and O's and daily weight   Hypertension, stable, at goal:  -Takes amlodipine at home, continue Lopressor 25 mg twice daily, started here in the hospital. -Continue to monitor vital signs   Post ictal state/delirium:  [facility] course remarkable for delirium.  Was given Precedex in the ICU.  He was on Zyprexa which was stopped due to persistent sleepiness/drowsiness.   -Mental status is back to baseline on Keppra.   Hepatitis C/prostate cancer:  -Management as outpatient.   Alcohol withdrawal/alcohol abuse:  -Drinks 6 packs of beer every day.  Counseled for cessation.  Continue multivitamin, thiamine and folic acid.  Given high-dose thiamine.  He was on CIWA protocol, also on phenobarbital, stopped   Debility/deconditioning:  -PT/OT recommending skilled nursing facility on discharge. -TOC assisting with SNF placement   Incidental COVID positive:  -COVID screen test that was done for preparation for  SNF discharge came out to be positive. --Oxygen saturation 98 to 100% room air.  Chest x-ray does not show any infiltrate.   -Continue supportive care.     Nutrition Problem: Severe Malnutrition Etiology: chronic illness (h/o cancer, hep C)   DVT prophylaxis: Lovenox Code Status: Full code Family Communication:  None present at  bedside.  Plan of care discussed with patient in length and he verbalized understanding and agreed with it. Disposition Plan: SNF  Consultants:  Neurology Neurosurgery PCCM  Procedures:  Intubation EEG   Antimicrobials: None  Status is:  Inpatient    Subjective: Patient seen and examined.  Sitting comfortably on the bed.  Eating breakfast.  Reports some back pain.  Denies fever, chills, cough, congestion, shortness of breath, headache, blurry vision.  No acute events overnight.  Objective: Vitals:   04/03/21 2000 04/03/21 2326 04/04/21 0343 04/04/21 0351  BP: 125/62 127/80 134/78   Pulse: 80 69 74   Resp: 18 13 16    Temp: 98.9 F (37.2 C) 98.3 F (36.8 C) 98.5 F (36.9 C)   TempSrc: Oral Oral Oral   SpO2: 100% 100% 100%   Weight:    64.3 kg  Height:        Intake/Output Summary (Last 24 hours) at 04/04/2021 1154 Last data filed at 04/03/2021 2000 Gross per 24 hour  Intake --  Output 300 ml  Net -300 ml    Filed Weights   03/30/21 0325 04/02/21 0421 04/04/21 0351  Weight: 62.3 kg 63.5 kg 64.3 kg    Examination:  General exam: Appears calm and comfortable, on room air, eating breakfast, communicating well, following commands Respiratory system: Clear to auscultation. Respiratory effort normal. Cardiovascular system: S1 & S2 heard, RRR. No JVD, murmurs, rubs, gallops or clicks. No pedal edema. Gastrointestinal system: Abdomen is nondistended, soft and nontender. No organomegaly or masses felt. Normal bowel sounds heard. Central nervous system: Alert and oriented. No focal neurological deficits. Extremities: Symmetric 5 x 5 power. Skin: No rashes, lesions or ulcers Psychiatry: Judgement and insight appear normal. Mood & affect appropriate.    Data Reviewed: I have personally reviewed following labs and imaging studies  CBC: Recent Labs  Lab 03/29/21 0347  WBC 5.3  HGB 13.6  HCT 41.9  MCV 87.5  PLT 888    Basic Metabolic Panel: Recent Labs  Lab 03/29/21 0347  NA 133*  K 4.0  CL 102  CO2 24  GLUCOSE 89  BUN 18  CREATININE 0.79  CALCIUM 8.4*    GFR: Estimated Creatinine Clearance: 77 mL/min (by C-G formula based on SCr of 0.79 mg/dL). Liver Function Tests: No results for  input(s): AST, ALT, ALKPHOS, BILITOT, PROT, ALBUMIN in the last 168 hours. No results for input(s): LIPASE, AMYLASE in the last 168 hours. No results for input(s): AMMONIA in the last 168 hours. Coagulation Profile: No results for input(s): INR, PROTIME in the last 168 hours. Cardiac Enzymes: No results for input(s): CKTOTAL, CKMB, CKMBINDEX, TROPONINI in the last 168 hours. BNP (last 3 results) No results for input(s): PROBNP in the last 8760 hours. HbA1C: No results for input(s): HGBA1C in the last 72 hours. CBG: Recent Labs  Lab 04/02/21 1557 04/02/21 2022 04/03/21 0051 04/03/21 0414 04/03/21 0819  GLUCAP 138* 100* 151* 107* 101*    Lipid Profile: No results for input(s): CHOL, HDL, LDLCALC, TRIG, CHOLHDL, LDLDIRECT in the last 72 hours. Thyroid Function Tests: No results for input(s): TSH, T4TOTAL, FREET4, T3FREE, THYROIDAB in the last 72 hours. Anemia Panel: No results for input(s): VITAMINB12, FOLATE, FERRITIN, TIBC, IRON, RETICCTPCT in the last 72 hours. Sepsis Labs: No results for input(s): PROCALCITON, LATICACIDVEN in the last 168 hours.  Recent Results (from the past 240 hour(s))  Resp Panel by RT-PCR (Flu A&B, Covid) Nasopharyngeal Swab     Status: Abnormal   Collection Time: 03/30/21  3:57 PM   Specimen: Nasopharyngeal Swab; Nasopharyngeal(NP) swabs in vial transport medium  Result Value Ref Range Status   SARS Coronavirus 2 by RT PCR POSITIVE (A) NEGATIVE Final    Comment: (NOTE) SARS-CoV-2 target nucleic acids are DETECTED.  The SARS-CoV-2 RNA is generally detectable in upper respiratory specimens during the acute phase of infection. Positive results are indicative of the presence of the identified virus, but do not rule out bacterial infection or co-infection with other pathogens not detected by the test. Clinical correlation with patient history and other diagnostic information is necessary to determine patient infection status. The expected result is  Negative.  Fact Sheet for Patients: EntrepreneurPulse.com.au  Fact Sheet for Healthcare Providers: IncredibleEmployment.be  This test is not yet approved or cleared by the [place] FDA and  has been authorized for detection and/or diagnosis of SARS-CoV-2 by FDA under an Emergency Use Authorization (EUA).  This EUA will remain in effect (meaning this test can be used) for the duration of  the COVID-19 declaration under Section 564(b)(1) of the A ct, 21 U.S.C. section 360bbb-3(b)(1), unless the authorization is terminated or revoked sooner.     Influenza A by PCR NEGATIVE NEGATIVE Final   Influenza B by PCR NEGATIVE NEGATIVE Final    Comment: (NOTE) The Xpert Xpress SARS-CoV-2/FLU/RSV plus assay is intended as an aid in the diagnosis of influenza from Nasopharyngeal swab specimens and should not be used as a sole basis for treatment. Nasal washings and aspirates are unacceptable for Xpert Xpress SARS-CoV-2/FLU/RSV testing.  Fact Sheet for Patients: EntrepreneurPulse.com.au  Fact Sheet for Healthcare Providers: IncredibleEmployment.be  This test is not yet approved or cleared by the [place] FDA and has been authorized for detection and/or diagnosis of SARS-CoV-2 by FDA under an Emergency Use Authorization (EUA). This EUA will remain in effect (meaning this test can be used) for the duration of the COVID-19 declaration under Section 564(b)(1) of the Act, 21 U.S.C. section 360bbb-3(b)(1), unless the authorization is terminated or revoked.  Performed at [facility], [place] [address] [place], [place] [address]   Resp Panel by RT-PCR (Flu A&B, Covid) Nasopharyngeal Swab     Status: Abnormal   Collection Time: 03/31/21  1:01 PM   Specimen: Nasopharyngeal Swab; Nasopharyngeal(NP) swabs in vial transport medium  Result Value Ref Range Status   SARS Coronavirus 2 by RT PCR POSITIVE (A) NEGATIVE  Final    Comment: (NOTE) SARS-CoV-2 target nucleic acids are DETECTED.  The SARS-CoV-2 RNA is generally detectable in upper respiratory specimens during the acute phase of infection. Positive results are indicative of the presence of the identified virus, but do not rule out bacterial infection or co-infection with other pathogens not detected by the test. Clinical correlation with patient history and other diagnostic information is necessary to determine patient infection status. The expected result is Negative.  Fact Sheet for Patients: EntrepreneurPulse.com.au  Fact Sheet for Healthcare Providers: IncredibleEmployment.be  This test is not yet approved or cleared by the [place] FDA and  has been authorized for detection and/or diagnosis of SARS-CoV-2 by FDA under an Emergency Use Authorization (EUA).  This EUA will remain in effect (meaning this test can be used) for the duration of  the COVID-19 declaration under Section 564(b)(1) of the A ct, 21 U.S.C. section 360bbb-3(b)(1), unless the authorization is terminated or revoked sooner.     Influenza A by PCR NEGATIVE NEGATIVE Final   Influenza B by PCR NEGATIVE NEGATIVE Final    Comment: (NOTE)  The Xpert Xpress SARS-CoV-2/FLU/RSV plus assay is intended as an aid in the diagnosis of influenza from Nasopharyngeal swab specimens and should not be used as a sole basis for treatment. Nasal washings and aspirates are unacceptable for Xpert Xpress SARS-CoV-2/FLU/RSV testing.  Fact Sheet for Patients: EntrepreneurPulse.com.au  Fact Sheet for Healthcare Providers: IncredibleEmployment.be  This test is not yet approved or cleared by the [place] FDA and has been authorized for detection and/or diagnosis of SARS-CoV-2 by FDA under an Emergency Use Authorization (EUA). This EUA will remain in effect (meaning this test can be used) for the duration of  the COVID-19 declaration under Section 564(b)(1) of the Act, 21 U.S.C. section 360bbb-3(b)(1), unless the authorization is terminated or revoked.  Performed at [facility], [place] [address] [place], [place] [address]        Radiology Studies: No results found.  Scheduled Meds:  amLODipine  10 mg Oral Daily   benzonatate  200 mg Oral TID   enoxaparin (LOVENOX) injection  40 mg Subcutaneous Daily   feeding supplement  237 mL Oral TID BM   folic acid  1 mg Oral Daily   levETIRAcetam  500 mg Oral BID   metoprolol tartrate  25 mg Oral BID   multivitamin with minerals  1 tablet Oral Daily   thiamine  100 mg Oral Daily   Continuous Infusions:  sodium chloride Stopped (03/26/21 0155)     LOS: 12 days   Time spent: 35 minutes   Ashanti Ratti Loann Quill, MD [facility]  If 7PM-7AM, please contact night-coverage [email or web address] 04/04/2021, 11:54 AM

## 2021-04-05 DIAGNOSIS — R569 Unspecified convulsions: Secondary | ICD-10-CM | POA: Diagnosis not present

## 2021-04-05 NOTE — Progress Notes (Signed)
Occupational Therapy Treatment Patient Details Name: Dennis Levine MRN: [id number] DOB: 28-Feb-1951 Today's Date: 04/05/2021   History of present illness 70 yo admitted 12/13 after found unresponsive in bathroom with witnessed seizure and intubated 12/13-12/14. Head CT (-), MRI demonstrated Rt meningioma. PMhx: ETOH abuse, HTN, hep C   OT comments  Pt is progressing well. He required min guard for functional mobility with RW, and supervision for grooming/ADLs while standing at the sink. Pt tolerated >10 minutes of standing at the sink during functional tasks. He followed all 1 step commands, and required cues for multi-step directions. Increased time and effort required this session due to back pain. He continues to benefit from OT acutely. D/c recommendation remains appropriate.    Recommendations for follow up therapy are one component of a multi-disciplinary discharge planning process, led by the attending physician.  Recommendations may be updated based on patient status, additional functional criteria and insurance authorization.    Follow Up Recommendations  Skilled nursing-short term rehab (<3 hours/day)    Assistance Recommended at Discharge Frequent or constant Supervision/Assistance  Equipment Recommendations  Tub/shower seat       Precautions / Restrictions Precautions Precautions: Fall Precaution Comments: Covid+ Air/Contact Restrictions Weight Bearing Restrictions: No       Mobility Bed Mobility Overal bed mobility: Modified Independent                  Transfers Overall transfer level: Needs assistance Equipment used: Rolling walker (2 wheels) Transfers: Sit to/from Stand Sit to Stand: Min guard           General transfer comment: incrased effort due to back pain     Balance Overall balance assessment: Needs assistance Sitting-balance support: Feet supported Sitting balance-Leahy Scale: Fair     Standing balance support: No upper extremity  supported;During functional activity Standing balance-Leahy Scale: Fair                             ADL either performed or assessed with clinical judgement   ADL Overall ADL's : Needs assistance/impaired     Grooming: Supervision/safety;Standing;Wash/dry hands;Wash/dry face;Applying deodorant;Oral care Grooming Details (indicate cue type and reason): standing at the sink ~10 minutes Upper Body Bathing: Supervision/ safety;Sitting;Standing Upper Body Bathing Details (indicate cue type and reason): sitting & standing at the sink     Upper Body Dressing : Supervision/safety;Sitting Upper Body Dressing Details (indicate cue type and reason): sitting at the sink                 Functional mobility during ADLs: Min guard;Rolling walker (2 wheels) General ADL Comments: incrased time for all task. required cues for multi-step directions during grooming. follwed all 1 step directions.    Extremity/Trunk Assessment Upper Extremity Assessment LUE Deficits / Details: chronic L shoulder dysfunction limiting FF LUE Sensation: WNL LUE Coordination: WNL   Lower Extremity Assessment Lower Extremity Assessment: Defer to PT evaluation        Vision   Vision Assessment?: No apparent visual deficits   Perception Perception Perception: Not tested   Praxis Praxis Praxis: Not tested    Cognition Arousal/Alertness: Awake/alert Behavior During Therapy: [facility] for tasks assessed/performed;Impulsive Overall Cognitive Status: Impaired/Different from baseline Area of Impairment: Following commands;Safety/judgement;Attention;Problem solving                     Memory: Decreased short-term memory Following Commands: Follows one step commands consistently Safety/Judgement: Decreased awareness of safety;Decreased awareness  of deficits Awareness: Emergent Problem Solving: Slow processing;Requires verbal cues General Comments: pt follows all 1 step commands, requires  incrased cues for multi-step direction. cog is [facility] for grooming tasks at the sink.                General Comments VSS on RA    Pertinent Vitals/ Pain           Frequency  Min 2X/week        Progress Toward Goals  OT Goals(current goals can now be found in the care plan section)  Progress towards OT goals: Progressing toward goals  Acute Rehab OT Goals Patient Stated Goal: less back pain OT Goal Formulation: With patient Time For Goal Achievement: 04/09/21 Potential to Achieve Goals: Good ADL Goals Pt Will Perform Grooming: with supervision;standing Pt Will Perform Lower Body Bathing: with supervision;sit to/from stand Pt Will Perform Lower Body Dressing: with supervision;sit to/from stand Pt Will Transfer to Toilet: ambulating;with supervision Pt Will Perform Toileting - Clothing Manipulation and hygiene: with supervision;sit to/from stand  Plan Discharge plan remains appropriate;Frequency remains appropriate       AM-PAC OT "6 Clicks" Daily Activity     Outcome Measure   Help from another person eating meals?: A Little Help from another person taking care of personal grooming?: A Little Help from another person toileting, which includes using toliet, bedpan, or urinal?: A Lot Help from another person bathing (including washing, rinsing, drying)?: A Little Help from another person to put on and taking off regular upper body clothing?: A Little Help from another person to put on and taking off regular lower body clothing?: A Lot 6 Click Score: 16    End of Session Equipment Utilized During Treatment: Gait belt;Rolling walker (2 wheels)  OT Visit Diagnosis: Unsteadiness on feet (R26.81);Other symptoms and signs involving cognitive function   Activity Tolerance Patient tolerated treatment well   Patient Left with call bell/phone within reach;in chair;with chair alarm set   Nurse Communication Mobility status        Time: 1540-1610 OT Time Calculation  (min): 30 min  Charges: OT General Charges $OT Visit: 1 Visit OT Treatments $Self Care/Home Management : 23-37 mins   Lekita Kerekes A Carlton Sweaney 04/05/2021, 4:18 PM

## 2021-04-05 NOTE — Progress Notes (Signed)
Physical Therapy Treatment Patient Details Name: Dennis Levine MRN: [id number] DOB: [birth year] Today's Date: 04/05/2021   History of Present Illness 70 yo admitted 12/13 after found unresponsive in bathroom with witnessed seizure and intubated 12/13-12/14. Head CT (-), MRI demonstrated Rt meningioma. PMhx: ETOH abuse, HTN, hep C    PT Comments    Pt received in bathroom (NT leaving room), pt agreeable to therapy session and with good participation and tolerance for transfer and gait training. Pt needing up to minA for household distance gait trials in room with no AD and min guard when using RW, he needs mod cues mostly due to cognition/poor safety awareness but this is somewhat improved from previous week's sessions. Pt also able to stand from recliner with arms crossed and minA x 5 reps. He remains appropriate for SNF but if frequent supervision/assist were available could consider home with [facility] therapies for home safety/progression, pt unfortunately reports he he lives alone. Pt continues to benefit from PT services to progress toward functional mobility goals.    Recommendations for follow up therapy are one component of a multi-disciplinary discharge planning process, led by the attending physician.  Recommendations may be updated based on patient status, additional functional criteria and insurance authorization.  Follow Up Recommendations  Skilled nursing-short term rehab (<3 hours/day)     Assistance Recommended at Discharge Frequent or constant Supervision/Assistance  Equipment Recommendations  Rolling walker (2 wheels);BSC/3in1    Recommendations for Other Services       Precautions / Restrictions Precautions Precautions: Fall Precaution Comments: Covid+ Air/Contact Restrictions Weight Bearing Restrictions: No     Mobility  Bed Mobility Overal bed mobility: Modified Independent                  Transfers Overall transfer level: Needs assistance Equipment  used: Rolling walker (2 wheels) Transfers: Sit to/from Stand Sit to Stand: Min guard;Min assist           General transfer comment: pt received on BSC, needs increased assist to stand from low toilet height (minA) but only min guard from recliner    Ambulation/Gait Ambulation/Gait assistance: Min assist;Min guard Gait Distance (Feet): 70 Feet (36ft with RW, then 37ft) Assistive device: Rolling walker (2 wheels);None Gait Pattern/deviations: Step-through pattern;Decreased stride length;Drifts right/left;Narrow base of support;Trunk flexed   Gait velocity interpretation: <1.31 ft/sec, indicative of household ambulator   General Gait Details: Min guard with RW, minA with no AD due to lateral instability.      Balance Overall balance assessment: Needs assistance Sitting-balance support: Feet supported Sitting balance-Leahy Scale: Fair     Standing balance support: No upper extremity supported;During functional activity Standing balance-Leahy Scale: Fair            Cognition Arousal/Alertness: Awake/alert Behavior During Therapy: WFL for tasks assessed/performed;Impulsive Overall Cognitive Status: Impaired/Different from baseline Area of Impairment: Following commands;Safety/judgement;Attention;Problem solving       Memory: Decreased short-term memory Following Commands: Follows one step commands consistently Safety/Judgement: Decreased awareness of safety;Decreased awareness of deficits Awareness: Emergent Problem Solving: Slow processing;Requires verbal cues General Comments: Pt follows all 1 step commands, requires increased cues for multi-step direction.           General Comments General comments (skin integrity, edema, etc.): VSS on RA, pt does not report pain.        PT Goals (current goals can now be found in the care plan section) Acute Rehab PT Goals Patient Stated Goal: to get better and go to rehab PT Goal  Formulation: With patient Time For Goal  Achievement: 04/09/21 Progress towards PT goals: Progressing toward goals    Frequency    Min 3X/week      PT Plan Current plan remains appropriate       AM-PAC PT "6 Clicks" Mobility   Outcome Measure  Help needed turning from your back to your side while in a flat bed without using bedrails?: None Help needed moving from lying on your back to sitting on the side of a flat bed without using bedrails?: A Little Help needed moving to and from a bed to a chair (including a wheelchair)?: A Little Help needed standing up from a chair using your arms (e.g., wheelchair or bedside chair)?: A Little Help needed to walk in hospital room?: A Lot (mod cues) Help needed climbing 3-5 steps with a railing? : Total 6 Click Score: 16    End of Session Equipment Utilized During Treatment: Gait belt Activity Tolerance: Patient tolerated treatment well Patient left: in chair;with call bell/phone within reach;with chair alarm set;Other (comment) (eating lunch) Nurse Communication: Mobility status PT Visit Diagnosis: Other abnormalities of gait and mobility (R26.89);Difficulty in walking, not elsewhere classified (R26.2);Muscle weakness (generalized) (M62.81);Unsteadiness on feet (R26.81)     Time: 7253-6644 PT Time Calculation (min) (ACUTE ONLY): 25 min  Charges:  $Gait Training: 23-37 mins                     Cheetara Hoge P., PTA Acute Rehabilitation Services Pager: [phone] Office: [place] 04/05/2021, 6:48 PM

## 2021-04-05 NOTE — Progress Notes (Signed)
PROGRESS NOTE    Dennis Levine  YBO:[id number] DOB: Sep 27, 1950 DOA: 03/23/2021 PCP: Pcp, No   Brief Narrative:  Patient is a 70 year old male with history of prostate cancer, hepatitis C, hypertension, alcohol use who was found down at home by family on 12/13.  He was observed to be seizing during transfer to ER via EMS.  Code stroke was called.  On presentation he was hypoxic, tachypneic.  Patient was intubated in the ED for concern of ongoing seizure, airway protection, admitted under PCCM service.  Neurology was consulted.  Transferred to [facility] service on 12/16.  Hospital course remarkable for persistent confusion/agitation most likely secondary to alcohol withdrawal.  Respiratory status, mental status has improved and currently at baseline.     PT/OT recommended skilled nursing facility on discharge, was ready for discharge but his COVID test incidentally came out to be positive on 03/30/2021.  Discharge prolonged due to isolation status.  TOC following.  With 10 days isolation will be ready for dc to SNF on 04/10/21.  Assessment & Plan:  New onset seizures:  -Presented with episode of unresponsiveness, left word gaze, seizures followed by left-sided weakness.   -MRI of the brain did not show any acute infarct or intracranial process, no definitive etiology for seizure.  But showed dural based mass along the right bilateral convexity consistent with meningioma.   -EEG showed epileptogenicity from the right frontal region but no active seizures.   -Seen by neurology.  Currently on Keppra.  Will need to follow-up with neurology as an outpatient.   -Neurology thought that alcohol could be the etiology for his seizure.  Currently back to his baseline mentation.   Meningioma:  -He needs to follow-up with neurosurgery as an outpatient.   -No signal change in the brain parenchyma adjacent to the meningioma making it less likely the cause for his seizure.   Resolved acute hypoxic respiratory  failure required intubation:  -Intubated on 12/13 for airway protection.  Extubated on 12/14.  Chest x-ray done on 12/17 showed mild vascular congestion with no focal infiltrate.   -Currently on room air with oxygen saturation of 98%.   Chronic diastolic congestive heart failure:  -Currently euvolemic.  Echo on 03/24/21 showed EF of 65 to 27%, grade 1 diastolic function. -Strict I's and O's and daily weight   Hypertension, stable, at goal:  -Takes amlodipine at home, continue Lopressor 25 mg twice daily, started here in the hospital. -Continue to monitor vital signs   Post ictal state/delirium:  [facility] course remarkable for delirium.  Was given Precedex in the ICU.  He was on Zyprexa which was stopped due to persistent sleepiness/drowsiness.   -Mental status is back to baseline on Keppra.   Hepatitis C/prostate cancer:  -Management as outpatient.   Alcohol withdrawal/alcohol abuse:  -Drinks 6 packs of beer every day.  Counseled for cessation.  Continue multivitamin, thiamine and folic acid.  Given high-dose thiamine.  He was on CIWA protocol, also on phenobarbital, stopped   Debility/deconditioning:  -PT/OT recommending skilled nursing facility on discharge. -TOC assisting with SNF placement   Incidental COVID positive:  -COVID screen test that was done for preparation for  SNF discharge came out to be positive. --Oxygen saturation 98 to 100% room air.  Chest x-ray does not show any infiltrate.   -Continue supportive care.    Lower back pain: -Tylenol as needed for pain control   Nutrition Problem: Severe Malnutrition Etiology: chronic illness (h/o cancer, hep C)   DVT prophylaxis: Lovenox  Code Status: Full code Family Communication:  None present at bedside.  Plan of care discussed with patient in length and he verbalized understanding and agreed with it. Disposition Plan: SNF  Consultants:  Neurology Neurosurgery PCCM  Procedures:  Intubation EEG    Antimicrobials: None  Status is: Inpatient    Subjective: Patient seen and examined.  Sitting comfortably on the bed.  Tells me that overall he is doing fine, has some lower back pain however denies association with numbness tingling sensation in lower extremities, able to sleep last night.  No acute events overnight.    Objective: Vitals:   04/04/21 2021 04/05/21 0000 04/05/21 0356 04/05/21 0500  BP: 115/75 126/74 132/76   Pulse: 76 74 77   Resp: 19 18 18    Temp: 99 F (37.2 C) 98.8 F (37.1 C) 98.6 F (37 C)   TempSrc: Oral Oral Oral   SpO2: 98% 98% 99%   Weight:    60.8 kg  Height:        Intake/Output Summary (Last 24 hours) at 04/05/2021 1047 Last data filed at 04/04/2021 1759 Gross per 24 hour  Intake --  Output 950 ml  Net -950 ml    Filed Weights   04/02/21 0421 04/04/21 0351 04/05/21 0500  Weight: 63.5 kg 64.3 kg 60.8 kg    Examination:  General exam: Appears calm and comfortable, on room air,  communicating well, following commands Respiratory system: Clear to auscultation. Respiratory effort normal. Cardiovascular system: S1 & S2 heard, RRR. No JVD, murmurs, rubs, gallops or clicks. No pedal edema. Gastrointestinal system: Abdomen is nondistended, soft and nontender. No organomegaly or masses felt. Normal bowel sounds heard. Central nervous system: Alert and oriented. No focal neurological deficits. Extremities: Symmetric 5 x 5 power. Lower back: No sign of infection, no skin breakdown/ulcer/lesion, some tenderness.  No redness. Skin: No rashes, lesions or ulcers Psychiatry: Judgement and insight appear normal. Mood & affect appropriate.    Data Reviewed: I have personally reviewed following labs and imaging studies  CBC: No results for input(s): WBC, NEUTROABS, HGB, HCT, MCV, PLT in the last 168 hours.  Basic Metabolic Panel: No results for input(s): NA, K, CL, CO2, GLUCOSE, BUN, CREATININE, CALCIUM, MG, PHOS in the last 168  hours.  GFR: Estimated Creatinine Clearance: 72.8 mL/min (by C-G formula based on SCr of 0.79 mg/dL). Liver Function Tests: No results for input(s): AST, ALT, ALKPHOS, BILITOT, PROT, ALBUMIN in the last 168 hours. No results for input(s): LIPASE, AMYLASE in the last 168 hours. No results for input(s): AMMONIA in the last 168 hours. Coagulation Profile: No results for input(s): INR, PROTIME in the last 168 hours. Cardiac Enzymes: No results for input(s): CKTOTAL, CKMB, CKMBINDEX, TROPONINI in the last 168 hours. BNP (last 3 results) No results for input(s): PROBNP in the last 8760 hours. HbA1C: No results for input(s): HGBA1C in the last 72 hours. CBG: Recent Labs  Lab 04/02/21 2022 04/03/21 0051 04/03/21 0414 04/03/21 0819 04/04/21 1247  GLUCAP 100* 151* 107* 101* 156*    Lipid Profile: No results for input(s): CHOL, HDL, LDLCALC, TRIG, CHOLHDL, LDLDIRECT in the last 72 hours. Thyroid Function Tests: No results for input(s): TSH, T4TOTAL, FREET4, T3FREE, THYROIDAB in the last 72 hours. Anemia Panel: No results for input(s): VITAMINB12, FOLATE, FERRITIN, TIBC, IRON, RETICCTPCT in the last 72 hours. Sepsis Labs: No results for input(s): PROCALCITON, LATICACIDVEN in the last 168 hours.  Recent Results (from the past 240 hour(s))  Resp Panel by RT-PCR (Flu A&B, Covid)  Nasopharyngeal Swab     Status: Abnormal   Collection Time: 03/30/21  3:57 PM   Specimen: Nasopharyngeal Swab; Nasopharyngeal(NP) swabs in vial transport medium  Result Value Ref Range Status   SARS Coronavirus 2 by RT PCR POSITIVE (A) NEGATIVE Final    Comment: (NOTE) SARS-CoV-2 target nucleic acids are DETECTED.  The SARS-CoV-2 RNA is generally detectable in upper respiratory specimens during the acute phase of infection. Positive results are indicative of the presence of the identified virus, but do not rule out bacterial infection or co-infection with other pathogens not detected by the test. Clinical  correlation with patient history and other diagnostic information is necessary to determine patient infection status. The expected result is Negative.  Fact Sheet for Patients: EntrepreneurPulse.com.au  Fact Sheet for Healthcare Providers: IncredibleEmployment.be  This test is not yet approved or cleared by the [place] FDA and  has been authorized for detection and/or diagnosis of SARS-CoV-2 by FDA under an Emergency Use Authorization (EUA).  This EUA will remain in effect (meaning this test can be used) for the duration of  the COVID-19 declaration under Section 564(b)(1) of the A ct, 21 U.S.C. section 360bbb-3(b)(1), unless the authorization is terminated or revoked sooner.     Influenza A by PCR NEGATIVE NEGATIVE Final   Influenza B by PCR NEGATIVE NEGATIVE Final    Comment: (NOTE) The Xpert Xpress SARS-CoV-2/FLU/RSV plus assay is intended as an aid in the diagnosis of influenza from Nasopharyngeal swab specimens and should not be used as a sole basis for treatment. Nasal washings and aspirates are unacceptable for Xpert Xpress SARS-CoV-2/FLU/RSV testing.  Fact Sheet for Patients: EntrepreneurPulse.com.au  Fact Sheet for Healthcare Providers: IncredibleEmployment.be  This test is not yet approved or cleared by the [place] FDA and has been authorized for detection and/or diagnosis of SARS-CoV-2 by FDA under an Emergency Use Authorization (EUA). This EUA will remain in effect (meaning this test can be used) for the duration of the COVID-19 declaration under Section 564(b)(1) of the Act, 21 U.S.C. section 360bbb-3(b)(1), unless the authorization is terminated or revoked.  Performed at [facility], [place] [address], [place], [place] [address]   Resp Panel by RT-PCR (Flu A&B, Covid) Nasopharyngeal Swab     Status: Abnormal   Collection Time: 03/31/21  1:01 PM   Specimen: Nasopharyngeal  Swab; Nasopharyngeal(NP) swabs in vial transport medium  Result Value Ref Range Status   SARS Coronavirus 2 by RT PCR POSITIVE (A) NEGATIVE Final    Comment: (NOTE) SARS-CoV-2 target nucleic acids are DETECTED.  The SARS-CoV-2 RNA is generally detectable in upper respiratory specimens during the acute phase of infection. Positive results are indicative of the presence of the identified virus, but do not rule out bacterial infection or co-infection with other pathogens not detected by the test. Clinical correlation with patient history and other diagnostic information is necessary to determine patient infection status. The expected result is Negative.  Fact Sheet for Patients: EntrepreneurPulse.com.au  Fact Sheet for Healthcare Providers: IncredibleEmployment.be  This test is not yet approved or cleared by the [place] FDA and  has been authorized for detection and/or diagnosis of SARS-CoV-2 by FDA under an Emergency Use Authorization (EUA).  This EUA will remain in effect (meaning this test can be used) for the duration of  the COVID-19 declaration under Section 564(b)(1) of the A ct, 21 U.S.C. section 360bbb-3(b)(1), unless the authorization is terminated or revoked sooner.     Influenza A by PCR NEGATIVE NEGATIVE Final  Influenza B by PCR NEGATIVE NEGATIVE Final    Comment: (NOTE) The Xpert Xpress SARS-CoV-2/FLU/RSV plus assay is intended as an aid in the diagnosis of influenza from Nasopharyngeal swab specimens and should not be used as a sole basis for treatment. Nasal washings and aspirates are unacceptable for Xpert Xpress SARS-CoV-2/FLU/RSV testing.  Fact Sheet for Patients: EntrepreneurPulse.com.au  Fact Sheet for Healthcare Providers: IncredibleEmployment.be  This test is not yet approved or cleared by the [place] FDA and has been authorized for detection and/or diagnosis of  SARS-CoV-2 by FDA under an Emergency Use Authorization (EUA). This EUA will remain in effect (meaning this test can be used) for the duration of the COVID-19 declaration under Section 564(b)(1) of the Act, 21 U.S.C. section 360bbb-3(b)(1), unless the authorization is terminated or revoked.  Performed at [facility], [place] [address], [place], [place] [address]        Radiology Studies: No results found.  Scheduled Meds:  amLODipine  10 mg Oral Daily   enoxaparin (LOVENOX) injection  40 mg Subcutaneous Daily   feeding supplement  237 mL Oral TID BM   folic acid  1 mg Oral Daily   levETIRAcetam  500 mg Oral BID   metoprolol tartrate  25 mg Oral BID   multivitamin with minerals  1 tablet Oral Daily   thiamine  100 mg Oral Daily   Continuous Infusions:  sodium chloride Stopped (03/26/21 0155)     LOS: 13 days   Time spent: 35 minutes   Gibson Telleria Loann Quill, MD [facility]  If 7PM-7AM, please contact night-coverage [email or web address] 04/05/2021, 10:47 AM

## 2021-04-06 DIAGNOSIS — R569 Unspecified convulsions: Secondary | ICD-10-CM | POA: Diagnosis not present

## 2021-04-06 NOTE — Progress Notes (Signed)
Nutrition Follow-up  DOCUMENTATION CODES:   Severe malnutrition in context of chronic illness  INTERVENTION:  -continue Ensure Enlive po TID, each supplement provides 350 kcal and 20 grams of protein -continue MVI with minerals daily  NUTRITION DIAGNOSIS:   Severe Malnutrition related to chronic illness (h/o cancer, hep C) as evidenced by severe fat depletion, severe muscle depletion.  ongoing  GOAL:   Patient will meet greater than or equal to 90% of their needs  progressing  MONITOR:   PO intake, Supplement acceptance, Labs, Weight trends, I & O's  REASON FOR ASSESSMENT:   Consult Enteral/tube feeding initiation and management  ASSESSMENT:   Pt with PMH significant for prostate cancer, hepatitis C, and HTN found down at home and was admitted with new onset seizures, diffuse intracranial atherosclerosis, and acute hypoxic respiratory failure requiring intubation.  12/13 admitted with seizures after being found down at home; intubated 12/14 MRI with no infarct/acute intracranial process, but noted dural based mass consistent with meningioma along R parietal convexity; extubated  Pt continues to be without complaints and is tolerating diet and supplements well.   PO Intake: 50-100% x last 7 recorded meals (79% avg meal intake)   UOP: 1356ml x24 hours I/O: -3827ml since admit  Current weight: 60.8 kg Admit weight: 63.7 kg   Medications: Scheduled Meds:  amLODipine  10 mg Oral Daily   enoxaparin (LOVENOX) injection  40 mg Subcutaneous Daily   feeding supplement  237 mL Oral TID BM   folic acid  1 mg Oral Daily   levETIRAcetam  500 mg Oral BID   metoprolol tartrate  25 mg Oral BID   multivitamin with minerals  1 tablet Oral Daily   thiamine  100 mg Oral Daily  Continuous Infusions:  sodium chloride Stopped (03/26/21 0155)   Labs: No results for input(s): NA, K, CL, CO2, BUN, CREATININE, CALCIUM, MG, PHOS, GLUCOSE in the last 168 hours. CBGs: 62-204 x24  hours   Diet Order:   Diet Order             Diet general           DIET DYS 3 Room service appropriate? Yes with Assist; Fluid consistency: Thin  Diet effective now                   EDUCATION NEEDS:   Education needs have been addressed  Skin:  Skin Assessment: Reviewed RN Assessment  Last BM:  12/26  Height:   Ht Readings from Last 1 Encounters:  03/23/21 5\' 10"  (1.778 m)    Weight:   Wt Readings from Last 1 Encounters:  04/05/21 60.8 kg    BMI:  Body mass index is 19.23 kg/m.  Estimated Nutritional Needs:   Kcal:  1900-2100  Protein:  95-105 grams  Fluid:  >1.9L     Theone Stanley., MS, RD, LDN (she/her/hers) RD pager number and weekend/on-call pager number located in [place].

## 2021-04-06 NOTE — Progress Notes (Signed)
PROGRESS NOTE    Dennis Levine  IEP:[id number] DOB: [birth year] DOA: 03/23/2021 PCP: Pcp, No   Brief Narrative:  Patient is a 70 year old male with history of prostate cancer, hepatitis C, hypertension, alcohol use who was found down at home by family on 12/13.  He was observed to be seizing during transfer to ER via EMS.  Code stroke was called.  On presentation he was hypoxic, tachypneic.  Patient was intubated in the ED for concern of ongoing seizure, airway protection, admitted under PCCM service.  Neurology was consulted.  Transferred to [facility] service on 12/16.  Hospital course remarkable for persistent confusion/agitation most likely secondary to alcohol withdrawal.  Respiratory status, mental status has improved and currently at baseline.     PT/OT recommended skilled nursing facility on discharge, was ready for discharge but his COVID test incidentally came out to be positive on 03/30/2021.  Discharge prolonged due to isolation status.  TOC following.  With 10 days isolation will be ready for dc to SNF on 04/10/21.  04/06/2021: Seen at his bedside.  Has no new complaint at this time of the visit.  Awaiting placement.  Assessment & Plan:  New onset seizures:  -Presented with episode of unresponsiveness, left word gaze, seizures followed by left-sided weakness.   -MRI of the brain did not show any acute infarct or intracranial process, no definitive etiology for seizure.  But showed dural based mass along the right bilateral convexity consistent with meningioma.   -EEG showed epileptogenicity from the right frontal region but no active seizures.   -Seen by neurology.  Currently on Keppra.  Will need to follow-up with neurology as an outpatient.   -Neurology thought that alcohol could be the etiology for his seizure.  Currently back to his baseline mentation.   Meningioma:  -He needs to follow-up with neurosurgery as an outpatient.   -No signal change in the brain parenchyma adjacent to  the meningioma making it less likely the cause for his seizure.   Resolved acute hypoxic respiratory failure required intubation:  -Intubated on 12/13 for airway protection.  Extubated on 12/14.  Chest x-ray done on 12/17 showed mild vascular congestion with no focal infiltrate.   -Currently on room air with oxygen saturation of 98%.   Chronic diastolic congestive heart failure:  -Currently euvolemic.  Echo on 03/24/21 showed EF of 65 to 66%, grade 1 diastolic function. -Strict I's and O's and daily weight   Hypertension, stable, at goal:  -Takes amlodipine at home, continue Lopressor 25 mg twice daily, started here in the hospital. -Continue to monitor vital signs   Post ictal state/delirium:  [facility] course remarkable for delirium.  Was given Precedex in the ICU.  He was on Zyprexa which was stopped due to persistent sleepiness/drowsiness.   -Mental status is back to baseline on Keppra.   Hepatitis C/prostate cancer:  -Management as outpatient.   Alcohol withdrawal/alcohol abuse:  -Drinks 6 packs of beer every day.  Counseled for cessation.  Continue multivitamin, thiamine and folic acid.  Given high-dose thiamine.  He was on CIWA protocol, also on phenobarbital, stopped   Debility/deconditioning:  -PT/OT recommending skilled nursing facility on discharge. -TOC assisting with SNF placement   Incidental COVID positive:  -COVID screen test that was done for preparation for  SNF discharge came out to be positive. --Oxygen saturation 98 to 100% room air.  Chest x-ray does not show any infiltrate.   -Continue supportive care.    Lower back pain: -Tylenol as needed for  pain control   Nutrition Problem: Severe Malnutrition Etiology: chronic illness (h/o cancer, hep C)   DVT prophylaxis: Lovenox Code Status: Full code Family Communication:  None present at bedside.  Plan of care discussed with patient in length and he verbalized understanding and agreed with it. Disposition  Plan: SNF  Consultants:  Neurology Neurosurgery PCCM  Procedures:  Intubation EEG   Antimicrobials: None  Status is: Inpatient     Objective: Vitals:   04/06/21 0619 04/06/21 0740 04/06/21 1006 04/06/21 1206  BP: 120/79 118/79 124/74 105/77  Pulse: 74 67 78 71  Resp: 20 18  18   Temp: 97.8 F (36.6 C) (!) 97.4 F (36.3 C)  97.9 F (36.6 C)  TempSrc: Oral Oral  Oral  SpO2: 100% 100% 100% 100%  Weight:      Height:        Intake/Output Summary (Last 24 hours) at 04/06/2021 1529 Last data filed at 04/06/2021 1500 Gross per 24 hour  Intake 240 ml  Output 1650 ml  Net -1410 ml   Filed Weights   04/02/21 0421 04/04/21 0351 04/05/21 0500  Weight: 63.5 kg 64.3 kg 60.8 kg    Examination:  General exam: Appears calm and comfortable, on room air,  communicating well, following commands Respiratory system: Clear to auscultation. Respiratory effort normal. Cardiovascular system: S1 & S2 heard, RRR. No JVD, murmurs, rubs, gallops or clicks. No pedal edema. Gastrointestinal system: Abdomen is nondistended, soft and nontender. No organomegaly or masses felt. Normal bowel sounds heard. Central nervous system: Alert and oriented. No focal neurological deficits. Extremities: Symmetric 5 x 5 power. Lower back: No sign of infection, no skin breakdown/ulcer/lesion, some tenderness.  No redness. Skin: No rashes, lesions or ulcers Psychiatry: Judgement and insight appear normal. Mood & affect appropriate.    Data Reviewed: I have personally reviewed following labs and imaging studies  CBC: No results for input(s): WBC, NEUTROABS, HGB, HCT, MCV, PLT in the last 168 hours.  Basic Metabolic Panel: No results for input(s): NA, K, CL, CO2, GLUCOSE, BUN, CREATININE, CALCIUM, MG, PHOS in the last 168 hours.  GFR: Estimated Creatinine Clearance: 72.8 mL/min (by C-G formula based on SCr of 0.79 mg/dL). Liver Function Tests: No results for input(s): AST, ALT, ALKPHOS, BILITOT,  PROT, ALBUMIN in the last 168 hours. No results for input(s): LIPASE, AMYLASE in the last 168 hours. No results for input(s): AMMONIA in the last 168 hours. Coagulation Profile: No results for input(s): INR, PROTIME in the last 168 hours. Cardiac Enzymes: No results for input(s): CKTOTAL, CKMB, CKMBINDEX, TROPONINI in the last 168 hours. BNP (last 3 results) No results for input(s): PROBNP in the last 8760 hours. HbA1C: No results for input(s): HGBA1C in the last 72 hours. CBG: Recent Labs  Lab 04/02/21 2022 04/03/21 0051 04/03/21 0414 04/03/21 0819 04/04/21 1247  GLUCAP 100* 151* 107* 101* 156*   Lipid Profile: No results for input(s): CHOL, HDL, LDLCALC, TRIG, CHOLHDL, LDLDIRECT in the last 72 hours. Thyroid Function Tests: No results for input(s): TSH, T4TOTAL, FREET4, T3FREE, THYROIDAB in the last 72 hours. Anemia Panel: No results for input(s): VITAMINB12, FOLATE, FERRITIN, TIBC, IRON, RETICCTPCT in the last 72 hours. Sepsis Labs: No results for input(s): PROCALCITON, LATICACIDVEN in the last 168 hours.  Recent Results (from the past 240 hour(s))  Resp Panel by RT-PCR (Flu A&B, Covid) Nasopharyngeal Swab     Status: Abnormal   Collection Time: 03/30/21  3:57 PM   Specimen: Nasopharyngeal Swab; Nasopharyngeal(NP) swabs in vial transport medium  Result Value Ref Range Status   SARS Coronavirus 2 by RT PCR POSITIVE (A) NEGATIVE Final    Comment: (NOTE) SARS-CoV-2 target nucleic acids are DETECTED.  The SARS-CoV-2 RNA is generally detectable in upper respiratory specimens during the acute phase of infection. Positive results are indicative of the presence of the identified virus, but do not rule out bacterial infection or co-infection with other pathogens not detected by the test. Clinical correlation with patient history and other diagnostic information is necessary to determine patient infection status. The expected result is Negative.  Fact Sheet for  Patients: EntrepreneurPulse.com.au  Fact Sheet for Healthcare Providers: IncredibleEmployment.be  This test is not yet approved or cleared by the [place] FDA and  has been authorized for detection and/or diagnosis of SARS-CoV-2 by FDA under an Emergency Use Authorization (EUA).  This EUA will remain in effect (meaning this test can be used) for the duration of  the COVID-19 declaration under Section 564(b)(1) of the A ct, 21 U.S.C. section 360bbb-3(b)(1), unless the authorization is terminated or revoked sooner.     Influenza A by PCR NEGATIVE NEGATIVE Final   Influenza B by PCR NEGATIVE NEGATIVE Final    Comment: (NOTE) The Xpert Xpress SARS-CoV-2/FLU/RSV plus assay is intended as an aid in the diagnosis of influenza from Nasopharyngeal swab specimens and should not be used as a sole basis for treatment. Nasal washings and aspirates are unacceptable for Xpert Xpress SARS-CoV-2/FLU/RSV testing.  Fact Sheet for Patients: EntrepreneurPulse.com.au  Fact Sheet for Healthcare Providers: IncredibleEmployment.be  This test is not yet approved or cleared by the [place] FDA and has been authorized for detection and/or diagnosis of SARS-CoV-2 by FDA under an Emergency Use Authorization (EUA). This EUA will remain in effect (meaning this test can be used) for the duration of the COVID-19 declaration under Section 564(b)(1) of the Act, 21 U.S.C. section 360bbb-3(b)(1), unless the authorization is terminated or revoked.  Performed at [facility], [place] [address], [place], [place] [address]   Resp Panel by RT-PCR (Flu A&B, Covid) Nasopharyngeal Swab     Status: Abnormal   Collection Time: 03/31/21  1:01 PM   Specimen: Nasopharyngeal Swab; Nasopharyngeal(NP) swabs in vial transport medium  Result Value Ref Range Status   SARS Coronavirus 2 by RT PCR POSITIVE (A) NEGATIVE Final    Comment:  (NOTE) SARS-CoV-2 target nucleic acids are DETECTED.  The SARS-CoV-2 RNA is generally detectable in upper respiratory specimens during the acute phase of infection. Positive results are indicative of the presence of the identified virus, but do not rule out bacterial infection or co-infection with other pathogens not detected by the test. Clinical correlation with patient history and other diagnostic information is necessary to determine patient infection status. The expected result is Negative.  Fact Sheet for Patients: EntrepreneurPulse.com.au  Fact Sheet for Healthcare Providers: IncredibleEmployment.be  This test is not yet approved or cleared by the [place] FDA and  has been authorized for detection and/or diagnosis of SARS-CoV-2 by FDA under an Emergency Use Authorization (EUA).  This EUA will remain in effect (meaning this test can be used) for the duration of  the COVID-19 declaration under Section 564(b)(1) of the A ct, 21 U.S.C. section 360bbb-3(b)(1), unless the authorization is terminated or revoked sooner.     Influenza A by PCR NEGATIVE NEGATIVE Final   Influenza B by PCR NEGATIVE NEGATIVE Final    Comment: (NOTE) The Xpert Xpress SARS-CoV-2/FLU/RSV plus assay is intended as an aid in the diagnosis  of influenza from Nasopharyngeal swab specimens and should not be used as a sole basis for treatment. Nasal washings and aspirates are unacceptable for Xpert Xpress SARS-CoV-2/FLU/RSV testing.  Fact Sheet for Patients: EntrepreneurPulse.com.au  Fact Sheet for Healthcare Providers: IncredibleEmployment.be  This test is not yet approved or cleared by the [place] FDA and has been authorized for detection and/or diagnosis of SARS-CoV-2 by FDA under an Emergency Use Authorization (EUA). This EUA will remain in effect (meaning this test can be used) for the duration of the COVID-19  declaration under Section 564(b)(1) of the Act, 21 U.S.C. section 360bbb-3(b)(1), unless the authorization is terminated or revoked.  Performed at [facility], [place] [address] [place], [place] [address]        Radiology Studies: No results found.  Scheduled Meds:  amLODipine  10 mg Oral Daily   enoxaparin (LOVENOX) injection  40 mg Subcutaneous Daily   feeding supplement  237 mL Oral TID BM   folic acid  1 mg Oral Daily   levETIRAcetam  500 mg Oral BID   metoprolol tartrate  25 mg Oral BID   multivitamin with minerals  1 tablet Oral Daily   thiamine  100 mg Oral Daily   Continuous Infusions:  sodium chloride Stopped (03/26/21 0155)     LOS: 14 days   Time spent: 35 minutes   Kayleen Memos, MD [facility]  If 7PM-7AM, please contact night-coverage [email or web address] 04/06/2021, 3:29 PM

## 2021-04-06 NOTE — Plan of Care (Signed)
°  Problem: Safety: Goal: Non-violent Restraint(s) Outcome: Not Applicable   

## 2021-04-07 DIAGNOSIS — R569 Unspecified convulsions: Secondary | ICD-10-CM | POA: Diagnosis not present

## 2021-04-07 DIAGNOSIS — U071 COVID-19: Secondary | ICD-10-CM | POA: Diagnosis not present

## 2021-04-07 MED ORDER — LEVETIRACETAM 100 MG/ML PO SOLN
500.0000 mg | Freq: Two times a day (BID) | ORAL | Status: DC
  Administered 2021-04-07 – 2021-04-14 (×15): 500 mg via ORAL
  Filled 2021-04-07 (×15): qty 5

## 2021-04-07 NOTE — Progress Notes (Signed)
PROGRESS NOTE    Dennis Levine  KYH:[id number] DOB: 1950-07-16 DOA: 03/23/2021 PCP: Pcp, No   Brief Narrative:   Patient is a 70 year old male with history of prostate cancer, hepatitis C, hypertension, alcohol use who was found down at home by family on 12/13.  He was observed to be seizing during transfer to ER via EMS.  Code stroke was called.  On presentation he was hypoxic, tachypneic.  Patient was intubated in the ED for concern of ongoing seizure, airway protection, admitted under PCCM service.  Neurology was consulted.  Transferred to [facility] service on 12/16.  Hospital course remarkable for persistent confusion/agitation most likely secondary to alcohol withdrawal.  Respiratory status, mental status has improved and currently at baseline.     PT/OT recommended skilled nursing facility on discharge, was ready for discharge but his COVID test incidentally came out to be positive on 03/30/2021.  Discharge prolonged due to isolation status.  TOC following.  With 10 days isolation will be ready for dc to SNF on 04/10/21.  04/06/2021: Seen at his bedside.  Has no new complaint at this time of the visit.  Awaiting placement.  Assessment & Plan:  New onset seizures:  -Presented with episode of unresponsiveness, left word gaze, seizures followed by left-sided weakness.   -MRI of the brain did not show any acute infarct or intracranial process, no definitive etiology for seizure.  But showed dural based mass along the right bilateral convexity consistent with meningioma.   -EEG showed epileptogenicity from the right frontal region but no active seizures.   -Seen by neurology.  Currently on Keppra.  Will need to follow-up with neurology as an outpatient.   -Neurology thought that alcohol could be the etiology for his seizure.  Currently back to his baseline mentation.   Meningioma:  -He needs to follow-up with neurosurgery as an outpatient.   -No signal change in the brain parenchyma adjacent  to the meningioma making it less likely the cause for his seizure.   Resolved acute hypoxic respiratory failure required intubation:  -Intubated on 12/13 for airway protection.  Extubated on 12/14.  Chest x-ray done on 12/17 showed mild vascular congestion with no focal infiltrate.   -Currently on room air with oxygen saturation of 98%.   Chronic diastolic congestive heart failure:  -Currently euvolemic.  Echo on 03/24/21 showed EF of 65 to 15%, grade 1 diastolic function. -Strict I's and O's and daily weight   Hypertension, stable, at goal:  -Takes amlodipine at home, continue Lopressor 25 mg twice daily, started here in the hospital. -Continue to monitor vital signs   Post ictal state/delirium:  [facility] course remarkable for delirium.  Was given Precedex in the ICU.  He was on Zyprexa which was stopped due to persistent sleepiness/drowsiness.   -Mental status is back to baseline on Keppra.   Hepatitis C/prostate cancer:  -Management as outpatient.   Alcohol withdrawal/alcohol abuse:  -Drinks 6 packs of beer every day.  Counseled for cessation.  Continue multivitamin, thiamine and folic acid.  Given high-dose thiamine.  He was on CIWA protocol, also on phenobarbital, stopped   Debility/deconditioning:  -PT/OT recommending skilled nursing facility on discharge. -TOC assisting with SNF placement   Incidental COVID positive:  -COVID screen test that was done for preparation for  SNF discharge came out to be positive. --Oxygen saturation 98 to 100% room air.  Chest x-ray does not show any infiltrate.   -Continue supportive care.    Lower back pain: -Tylenol as needed  for pain control   Nutrition Problem: Severe Malnutrition Etiology: chronic illness (h/o cancer, hep C)   DVT prophylaxis: Lovenox Code Status: Full code Family Communication: None at bedside, I have discussed with the patient at length Disposition Plan: SNF, patient is medically clear for discharge awaiting SNF  bed availability after he finishes quarantine  Consultants:  Neurology Neurosurgery PCCM  Procedures:  Intubation EEG   Antimicrobials: None  Status is: Inpatient     Objective: Vitals:   04/06/21 1603 04/06/21 1953 04/07/21 0510 04/07/21 0837  BP: 108/76 128/66 115/70 111/83  Pulse: 77 72 76 69  Resp: 18 17 20 20   Temp: 97.6 F (36.4 C) 99 F (37.2 C) 98.1 F (36.7 C) 97.9 F (36.6 C)  TempSrc: Oral Oral Oral Oral  SpO2: 100% 98% 100% 100%  Weight:      Height:        Intake/Output Summary (Last 24 hours) at 04/07/2021 1034 Last data filed at 04/07/2021 0500 Gross per 24 hour  Intake 240 ml  Output 800 ml  Net -560 ml   Filed Weights   04/02/21 0421 04/04/21 0351 04/05/21 0500  Weight: 63.5 kg 64.3 kg 60.8 kg    Examination:  Awake Alert, Oriented X 3, No new F.N deficits, Normal affect, weak and frail Symmetrical Chest wall movement, Good air movement bilaterally, CTAB RRR,No Gallops,Rubs or new Murmurs, No Parasternal Heave +ve B.Sounds, Abd Soft, No tenderness, No rebound - guarding or rigidity. No Cyanosis, Clubbing or edema, No new Rash or bruise     Data Reviewed: I have personally reviewed following labs and imaging studies  CBC: No results for input(s): WBC, NEUTROABS, HGB, HCT, MCV, PLT in the last 168 hours.  Basic Metabolic Panel: No results for input(s): NA, K, CL, CO2, GLUCOSE, BUN, CREATININE, CALCIUM, MG, PHOS in the last 168 hours.  GFR: Estimated Creatinine Clearance: 72.8 mL/min (by C-G formula based on SCr of 0.79 mg/dL). Liver Function Tests: No results for input(s): AST, ALT, ALKPHOS, BILITOT, PROT, ALBUMIN in the last 168 hours. No results for input(s): LIPASE, AMYLASE in the last 168 hours. No results for input(s): AMMONIA in the last 168 hours. Coagulation Profile: No results for input(s): INR, PROTIME in the last 168 hours. Cardiac Enzymes: No results for input(s): CKTOTAL, CKMB, CKMBINDEX, TROPONINI in the last 168  hours. BNP (last 3 results) No results for input(s): PROBNP in the last 8760 hours. HbA1C: No results for input(s): HGBA1C in the last 72 hours. CBG: Recent Labs  Lab 04/02/21 2022 04/03/21 0051 04/03/21 0414 04/03/21 0819 04/04/21 1247  GLUCAP 100* 151* 107* 101* 156*   Lipid Profile: No results for input(s): CHOL, HDL, LDLCALC, TRIG, CHOLHDL, LDLDIRECT in the last 72 hours. Thyroid Function Tests: No results for input(s): TSH, T4TOTAL, FREET4, T3FREE, THYROIDAB in the last 72 hours. Anemia Panel: No results for input(s): VITAMINB12, FOLATE, FERRITIN, TIBC, IRON, RETICCTPCT in the last 72 hours. Sepsis Labs: No results for input(s): PROCALCITON, LATICACIDVEN in the last 168 hours.  Recent Results (from the past 240 hour(s))  Resp Panel by RT-PCR (Flu A&B, Covid) Nasopharyngeal Swab     Status: Abnormal   Collection Time: 03/30/21  3:57 PM   Specimen: Nasopharyngeal Swab; Nasopharyngeal(NP) swabs in vial transport medium  Result Value Ref Range Status   SARS Coronavirus 2 by RT PCR POSITIVE (A) NEGATIVE Final    Comment: (NOTE) SARS-CoV-2 target nucleic acids are DETECTED.  The SARS-CoV-2 RNA is generally detectable in upper respiratory specimens during the acute  phase of infection. Positive results are indicative of the presence of the identified virus, but do not rule out bacterial infection or co-infection with other pathogens not detected by the test. Clinical correlation with patient history and other diagnostic information is necessary to determine patient infection status. The expected result is Negative.  Fact Sheet for Patients: EntrepreneurPulse.com.au  Fact Sheet for Healthcare Providers: IncredibleEmployment.be  This test is not yet approved or cleared by the [place] FDA and  has been authorized for detection and/or diagnosis of SARS-CoV-2 by FDA under an Emergency Use Authorization (EUA).  This EUA will remain in  effect (meaning this test can be used) for the duration of  the COVID-19 declaration under Section 564(b)(1) of the A ct, 21 U.S.C. section 360bbb-3(b)(1), unless the authorization is terminated or revoked sooner.     Influenza A by PCR NEGATIVE NEGATIVE Final   Influenza B by PCR NEGATIVE NEGATIVE Final    Comment: (NOTE) The Xpert Xpress SARS-CoV-2/FLU/RSV plus assay is intended as an aid in the diagnosis of influenza from Nasopharyngeal swab specimens and should not be used as a sole basis for treatment. Nasal washings and aspirates are unacceptable for Xpert Xpress SARS-CoV-2/FLU/RSV testing.  Fact Sheet for Patients: EntrepreneurPulse.com.au  Fact Sheet for Healthcare Providers: IncredibleEmployment.be  This test is not yet approved or cleared by the [place] FDA and has been authorized for detection and/or diagnosis of SARS-CoV-2 by FDA under an Emergency Use Authorization (EUA). This EUA will remain in effect (meaning this test can be used) for the duration of the COVID-19 declaration under Section 564(b)(1) of the Act, 21 U.S.C. section 360bbb-3(b)(1), unless the authorization is terminated or revoked.  Performed at [facility], [place] [address], [place], [place] [address]   Resp Panel by RT-PCR (Flu A&B, Covid) Nasopharyngeal Swab     Status: Abnormal   Collection Time: 03/31/21  1:01 PM   Specimen: Nasopharyngeal Swab; Nasopharyngeal(NP) swabs in vial transport medium  Result Value Ref Range Status   SARS Coronavirus 2 by RT PCR POSITIVE (A) NEGATIVE Final    Comment: (NOTE) SARS-CoV-2 target nucleic acids are DETECTED.  The SARS-CoV-2 RNA is generally detectable in upper respiratory specimens during the acute phase of infection. Positive results are indicative of the presence of the identified virus, but do not rule out bacterial infection or co-infection with other pathogens not detected by the test. Clinical  correlation with patient history and other diagnostic information is necessary to determine patient infection status. The expected result is Negative.  Fact Sheet for Patients: EntrepreneurPulse.com.au  Fact Sheet for Healthcare Providers: IncredibleEmployment.be  This test is not yet approved or cleared by the [place] FDA and  has been authorized for detection and/or diagnosis of SARS-CoV-2 by FDA under an Emergency Use Authorization (EUA).  This EUA will remain in effect (meaning this test can be used) for the duration of  the COVID-19 declaration under Section 564(b)(1) of the A ct, 21 U.S.C. section 360bbb-3(b)(1), unless the authorization is terminated or revoked sooner.     Influenza A by PCR NEGATIVE NEGATIVE Final   Influenza B by PCR NEGATIVE NEGATIVE Final    Comment: (NOTE) The Xpert Xpress SARS-CoV-2/FLU/RSV plus assay is intended as an aid in the diagnosis of influenza from Nasopharyngeal swab specimens and should not be used as a sole basis for treatment. Nasal washings and aspirates are unacceptable for Xpert Xpress SARS-CoV-2/FLU/RSV testing.  Fact Sheet for Patients: EntrepreneurPulse.com.au  Fact Sheet for Healthcare Providers: IncredibleEmployment.be  This test is not yet approved or cleared by the [place] and has been authorized for detection and/or diagnosis of SARS-CoV-2 by FDA under an Emergency Use Authorization (EUA). This EUA will remain in effect (meaning this test can be used) for the duration of the COVID-19 declaration under Section 564(b)(1) of the Act, 21 U.S.C. section 360bbb-3(b)(1), unless the authorization is terminated or revoked.  Performed at [facility], [place] [address] [place], [place] [address]        Radiology Studies: No results found.  Scheduled Meds:  amLODipine  10 mg Oral Daily   enoxaparin (LOVENOX) injection  40 mg  Subcutaneous Daily   feeding supplement  237 mL Oral TID BM   folic acid  1 mg Oral Daily   levETIRAcetam  500 mg Oral BID   metoprolol tartrate  25 mg Oral BID   multivitamin with minerals  1 tablet Oral Daily   thiamine  100 mg Oral Daily   Continuous Infusions:  sodium chloride Stopped (03/26/21 0155)     LOS: 15 days   Time spent: 30 minutes   Phillips Climes, MD [facility]  If 7PM-7AM, please contact night-coverage [email or web address] 04/07/2021, 10:34 AM

## 2021-04-07 NOTE — Progress Notes (Signed)
Physical Therapy Treatment Patient Details Name: Dennis Levine MRN: [id number] DOB: 09-15-1950 Today's Date: 04/07/2021   History of Present Illness 71 yo admitted 12/13 after found unresponsive in bathroom with witnessed seizure and intubated 12/13-12/14. Head CT (-), MRI demonstrated Rt meningioma. PMhx: ETOH abuse, HTN, hep C    PT Comments    Pt received in supine, alert and agreeable to therapy session with encouragement, with good participation in repeated transfers for strengthening, gait progression in room without AD and stair training. Pt needing up to minA for gait/stair trial and reports moderate (6/10 modified RPE) fatigue at end of session, agreeable to sit up in chair to eat lunch. Pt continues to benefit from PT services to progress toward functional mobility goals. Continue to recommend SNF, pt would need frequent supervision/assist in order to DC home but reports that he lives alone.   Recommendations for follow up therapy are one component of a multi-disciplinary discharge planning process, led by the attending physician.  Recommendations may be updated based on patient status, additional functional criteria and insurance authorization.  Follow Up Recommendations  Skilled nursing-short term rehab (<3 hours/day)     Assistance Recommended at Discharge Frequent or constant Supervision/Assistance  Equipment Recommendations  Rolling walker (2 wheels);BSC/3in1    Recommendations for Other Services       Precautions / Restrictions Precautions Precautions: Fall Precaution Comments: Covid+ Air/Contact Restrictions Weight Bearing Restrictions: No     Mobility  Bed Mobility Overal bed mobility: Needs Assistance Bed Mobility: Supine to Sit     Supine to sit: Supervision     General bed mobility comments: cues to initiate and assist with bed linens.    Transfers Overall transfer level: Needs assistance Equipment used: None Transfers: Sit to/from Stand Sit to  Stand: Min guard;Min assist           General transfer comment: pt needing min guard when using arms for support to stand and up to minA to stand from recliner (with pillow to elevate) and arms crossed; x10 reciprocal reps from chair    Ambulation/Gait Ambulation/Gait assistance: Min assist Gait Distance (Feet): 75 Feet Assistive device: None Gait Pattern/deviations: Step-through pattern;Decreased stride length;Trunk flexed;Narrow base of support Gait velocity: <0.4 m/s Gait velocity interpretation: <1.31 ft/sec, indicative of household ambulator   General Gait Details: minA and some HHA due to lateral instability without AD, pt with slower gait speed and more tentative pace without AD.   Stairs Stairs: Yes Stairs assistance: Min assist Stair Management: No rails;Forwards;Step to pattern (HHA on LUE and countertop support on RUE) Number of Stairs: 4 General stair comments: pt needs increased time to step up and heavy minA for stability with stepping up/down. Pt reliant on at least U UE support but more comfortable with BUE support when ascending/descending. No overt LOB or buckling.   Wheelchair Mobility    Modified Rankin (Stroke Patients Only)       Balance Overall balance assessment: Needs assistance Sitting-balance support: Feet supported Sitting balance-Leahy Scale: Fair     Standing balance support: No upper extremity supported;During functional activity Standing balance-Leahy Scale: Fair Standing balance comment: min guard for static standing, up to minA for dynamic tasks and HHA                            Cognition Arousal/Alertness: Awake/alert Behavior During Therapy: [facility] for tasks assessed/performed;Impulsive Overall Cognitive Status: Impaired/Different from baseline Area of Impairment: Following commands;Safety/judgement;Attention;Problem solving  Memory: Decreased short-term memory Following Commands: Follows one  step commands consistently Safety/Judgement: Decreased awareness of safety;Decreased awareness of deficits Awareness: Emergent Problem Solving: Slow processing;Requires verbal cues General Comments: Pt follows all 1 step commands, requires increased cues for multi-step direction. Pt with somewhat depressed affect today, normally smiling and eager for mobility, but good participation with encouragement.        Exercises Other Exercises Other Exercises: STS x 5 reps x2 sets from chair (10 total)    General Comments General comments (skin integrity, edema, etc.): VSS on RA, no acute s/sx distress, pt c/o fatigue today.      Pertinent Vitals/Pain Pain Assessment: No/denies pain Facial Expression: Relaxed, neutral Body Movements: Absence of movements Muscle Tension: Relaxed    Home Living                          Prior Function            PT Goals (current goals can now be found in the care plan section) Acute Rehab PT Goals Patient Stated Goal: to get better and go to rehab PT Goal Formulation: With patient Time For Goal Achievement: 04/09/21 Progress towards PT goals: Progressing toward goals    Frequency    Min 3X/week      PT Plan Current plan remains appropriate    Co-evaluation              AM-PAC PT "6 Clicks" Mobility   Outcome Measure  Help needed turning from your back to your side while in a flat bed without using bedrails?: A Little Help needed moving from lying on your back to sitting on the side of a flat bed without using bedrails?: A Little Help needed moving to and from a bed to a chair (including a wheelchair)?: A Little Help needed standing up from a chair using your arms (e.g., wheelchair or bedside chair)?: A Little Help needed to walk in hospital room?: A Lot (mod cues) Help needed climbing 3-5 steps with a railing? : A Lot (mod cues) 6 Click Score: 16    End of Session Equipment Utilized During Treatment: Gait  belt Activity Tolerance: Patient tolerated treatment well Patient left: in chair;with call bell/phone within reach;with chair alarm set;Other (comment) (eating lunch) Nurse Communication: Mobility status;Other (comment) (pt needs new bed sheets) PT Visit Diagnosis: Other abnormalities of gait and mobility (R26.89);Difficulty in walking, not elsewhere classified (R26.2);Muscle weakness (generalized) (M62.81);Unsteadiness on feet (R26.81)     Time: 1420-1450 PT Time Calculation (min) (ACUTE ONLY): 30 min  Charges:  $Gait Training: 8-22 mins $Therapeutic Exercise: 8-22 mins                     Cadell Gabrielson P., PTA Acute Rehabilitation Services Pager: [phone] Office: [place] 04/07/2021, 3:09 PM

## 2021-04-08 DIAGNOSIS — R569 Unspecified convulsions: Secondary | ICD-10-CM | POA: Diagnosis not present

## 2021-04-08 LAB — CBC
HCT: 39.9 % (ref 39.0–52.0)
Hemoglobin: 13.1 g/dL (ref 13.0–17.0)
MCH: 28.7 pg (ref 26.0–34.0)
MCHC: 32.8 g/dL (ref 30.0–36.0)
MCV: 87.3 fL (ref 80.0–100.0)
Platelets: 420 10*3/uL — ABNORMAL HIGH (ref 150–400)
RBC: 4.57 MIL/uL (ref 4.22–5.81)
RDW: 13.6 % (ref 11.5–15.5)
WBC: 5.4 10*3/uL (ref 4.0–10.5)
nRBC: 0 % (ref 0.0–0.2)

## 2021-04-08 LAB — BASIC METABOLIC PANEL
Anion gap: 7 (ref 5–15)
BUN: 21 mg/dL (ref 8–23)
CO2: 24 mmol/L (ref 22–32)
Calcium: 9.2 mg/dL (ref 8.9–10.3)
Chloride: 101 mmol/L (ref 98–111)
Creatinine, Ser: 0.63 mg/dL (ref 0.61–1.24)
GFR, Estimated: >60 mL/min (ref >60)
Glucose, Bld: 121 mg/dL — ABNORMAL HIGH (ref 70–99)
Potassium: 4.6 mmol/L (ref 3.5–5.1)
Sodium: 132 mmol/L — ABNORMAL LOW (ref 135–145)

## 2021-04-08 MED ORDER — ACETAMINOPHEN 650 MG RE SUPP: 325.0000 mg | Freq: Four times a day (QID) | RECTAL | Status: DC | PRN

## 2021-04-08 MED ORDER — HYDROCODONE-ACETAMINOPHEN 5-325 MG PO TABS
1.0000 | ORAL_TABLET | Freq: Four times a day (QID) | ORAL | Status: DC | PRN
  Administered 2021-04-08 – 2021-04-12 (×4): 1 via ORAL
  Filled 2021-04-08 (×4): qty 1

## 2021-04-08 MED ORDER — ACETAMINOPHEN 325 MG PO TABS
325.0000 mg | ORAL_TABLET | Freq: Four times a day (QID) | ORAL | Status: DC | PRN
  Administered 2021-04-08 – 2021-04-13 (×3): 325 mg via ORAL
  Filled 2021-04-08 (×3): qty 1

## 2021-04-08 NOTE — Progress Notes (Signed)
PROGRESS NOTE    Dennis Levine  GEX:[id number] DOB: 02-Dec-1950 DOA: 03/23/2021 PCP: Pcp, No   Brief Narrative:   Patient is a 70 year old male with history of prostate cancer, hepatitis C, hypertension, alcohol use who was found down at home by family on 12/13.  He was observed to be seizing during transfer to ER via EMS.  Code stroke was called.  On presentation he was hypoxic, tachypneic.  Patient was intubated in the ED for concern of ongoing seizure, airway protection, admitted under PCCM service.  Neurology was consulted.  Transferred to [facility] service on 12/16.  Hospital course remarkable for persistent confusion/agitation most likely secondary to alcohol withdrawal.  Respiratory status, mental status has improved and currently at baseline.     PT/OT recommended skilled nursing facility on discharge, was ready for discharge but his COVID test incidentally came out to be positive on 03/30/2021.  Discharge prolonged due to isolation status.  TOC following.  With 10 days isolation will be ready for dc to SNF on 04/10/21.  04/06/2021: Seen at his bedside.  Has no new complaint at this time of the visit.  Awaiting placement.  Assessment & Plan:  New onset seizures:  -Presented with episode of unresponsiveness, left word gaze, seizures followed by left-sided weakness.   -MRI of the brain did not show any acute infarct or intracranial process, no definitive etiology for seizure.  But showed dural based mass along the right bilateral convexity consistent with meningioma.   -EEG showed epileptogenicity from the right frontal region but no active seizures.   -Seen by neurology.  Currently on Keppra.  Will need to follow-up with neurology as an outpatient.   -Neurology thought that alcohol could be the etiology for his seizure.  Currently back to his baseline mentation.   Meningioma:  -He needs to follow-up with neurosurgery as an outpatient.   -No signal change in the brain parenchyma adjacent  to the meningioma making it less likely the cause for his seizure.   Resolved acute hypoxic respiratory failure required intubation:  -Intubated on 12/13 for airway protection.  Extubated on 12/14.  Chest x-ray done on 12/17 showed mild vascular congestion with no focal infiltrate.   -Currently on room air with oxygen saturation of 98%.   Chronic diastolic congestive heart failure:  -Currently euvolemic.  Echo on 03/24/21 showed EF of 65 to 01%, grade 1 diastolic function. -Strict I's and O's and daily weight   Hypertension, stable, at goal:  -Takes amlodipine at home, continue Lopressor 25 mg twice daily, started here in the hospital. -Continue to monitor vital signs   Post ictal state/delirium:  [facility] course remarkable for delirium.  Was given Precedex in the ICU.  He was on Zyprexa which was stopped due to persistent sleepiness/drowsiness.   -Mental status is back to baseline on Keppra.   Hepatitis C/prostate cancer:  -Management as outpatient.   Alcohol withdrawal/alcohol abuse:  -Drinks 6 packs of beer every day.  Counseled for cessation.  Continue multivitamin, thiamine and folic acid.  Given high-dose thiamine.  He was on CIWA protocol, also on phenobarbital, stopped   Debility/deconditioning:  -PT/OT recommending skilled nursing facility on discharge. -TOC assisting with SNF placement   Incidental COVID positive:  -COVID screen test that was done for preparation for  SNF discharge came out to be positive. --Oxygen saturation 98 to 100% room air.  Chest x-ray does not show any infiltrate.   -Continue supportive care.    Lower back pain: -Tylenol as needed  for pain control   Nutrition Problem: Severe Malnutrition Etiology: chronic illness (h/o cancer, hep C)   DVT prophylaxis: Lovenox Code Status: Full code Family Communication: None at bedside, I have discussed with the patient and answered his questions. Disposition Plan: SNF, patient is medically clear for  discharge awaiting SNF bed availability after he finishes quarantine  Consultants:  Neurology Neurosurgery PCCM  Procedures:  Intubation EEG   Antimicrobials: None  Status is: Inpatient  Subjective  He denies any complaints today     Objective: Vitals:   04/07/21 2035 04/07/21 2339 04/08/21 0352 04/08/21 0820  BP: 105/75 120/73 122/73 132/79  Pulse: 76 71 73 77  Resp: 16 16 17 16   Temp:   98 F (36.7 C) 98 F (36.7 C)  TempSrc:   Oral Oral  SpO2: 99% 100% 98% 99%  Weight:      Height:        Intake/Output Summary (Last 24 hours) at 04/08/2021 1215 Last data filed at 04/08/2021 1517 Gross per 24 hour  Intake 480 ml  Output 700 ml  Net -220 ml   Filed Weights   04/02/21 0421 04/04/21 0351 04/05/21 0500  Weight: 63.5 kg 64.3 kg 60.8 kg    Examination:  Awake Alert, Oriented X 3, No new F.N deficits, weak and frail Symmetrical Chest wall movement, Good air movement bilaterally, CTAB RRR,No Gallops,Rubs or new Murmurs, No Parasternal Heave +ve B.Sounds, Abd Soft, No tenderness, No rebound - guarding or rigidity. No Cyanosis, Clubbing or edema, No new Rash or bruise      Data Reviewed: I have personally reviewed following labs and imaging studies  CBC: Recent Labs  Lab 04/08/21 0225  WBC 5.4  HGB 13.1  HCT 39.9  MCV 87.3  PLT 420*    Basic Metabolic Panel: Recent Labs  Lab 04/08/21 0225  NA 132*  K 4.6  CL 101  CO2 24  GLUCOSE 121*  BUN 21  CREATININE 0.63  CALCIUM 9.2    GFR: Estimated Creatinine Clearance: 72.8 mL/min (by C-G formula based on SCr of 0.63 mg/dL). Liver Function Tests: No results for input(s): AST, ALT, ALKPHOS, BILITOT, PROT, ALBUMIN in the last 168 hours. No results for input(s): LIPASE, AMYLASE in the last 168 hours. No results for input(s): AMMONIA in the last 168 hours. Coagulation Profile: No results for input(s): INR, PROTIME in the last 168 hours. Cardiac Enzymes: No results for input(s): CKTOTAL, CKMB,  CKMBINDEX, TROPONINI in the last 168 hours. BNP (last 3 results) No results for input(s): PROBNP in the last 8760 hours. HbA1C: No results for input(s): HGBA1C in the last 72 hours. CBG: Recent Labs  Lab 04/02/21 2022 04/03/21 0051 04/03/21 0414 04/03/21 0819 04/04/21 1247  GLUCAP 100* 151* 107* 101* 156*   Lipid Profile: No results for input(s): CHOL, HDL, LDLCALC, TRIG, CHOLHDL, LDLDIRECT in the last 72 hours. Thyroid Function Tests: No results for input(s): TSH, T4TOTAL, FREET4, T3FREE, THYROIDAB in the last 72 hours. Anemia Panel: No results for input(s): VITAMINB12, FOLATE, FERRITIN, TIBC, IRON, RETICCTPCT in the last 72 hours. Sepsis Labs: No results for input(s): PROCALCITON, LATICACIDVEN in the last 168 hours.  Recent Results (from the past 240 hour(s))  Resp Panel by RT-PCR (Flu A&B, Covid) Nasopharyngeal Swab     Status: Abnormal   Collection Time: 03/30/21  3:57 PM   Specimen: Nasopharyngeal Swab; Nasopharyngeal(NP) swabs in vial transport medium  Result Value Ref Range Status   SARS Coronavirus 2 by RT PCR POSITIVE (A) NEGATIVE Final  Comment: (NOTE) SARS-CoV-2 target nucleic acids are DETECTED.  The SARS-CoV-2 RNA is generally detectable in upper respiratory specimens during the acute phase of infection. Positive results are indicative of the presence of the identified virus, but do not rule out bacterial infection or co-infection with other pathogens not detected by the test. Clinical correlation with patient history and other diagnostic information is necessary to determine patient infection status. The expected result is Negative.  Fact Sheet for Patients: EntrepreneurPulse.com.au  Fact Sheet for Healthcare Providers: IncredibleEmployment.be  This test is not yet approved or cleared by the [place] FDA and  has been authorized for detection and/or diagnosis of SARS-CoV-2 by FDA under an Emergency Use  Authorization (EUA).  This EUA will remain in effect (meaning this test can be used) for the duration of  the COVID-19 declaration under Section 564(b)(1) of the A ct, 21 U.S.C. section 360bbb-3(b)(1), unless the authorization is terminated or revoked sooner.     Influenza A by PCR NEGATIVE NEGATIVE Final   Influenza B by PCR NEGATIVE NEGATIVE Final    Comment: (NOTE) The Xpert Xpress SARS-CoV-2/FLU/RSV plus assay is intended as an aid in the diagnosis of influenza from Nasopharyngeal swab specimens and should not be used as a sole basis for treatment. Nasal washings and aspirates are unacceptable for Xpert Xpress SARS-CoV-2/FLU/RSV testing.  Fact Sheet for Patients: EntrepreneurPulse.com.au  Fact Sheet for Healthcare Providers: IncredibleEmployment.be  This test is not yet approved or cleared by the [place] FDA and has been authorized for detection and/or diagnosis of SARS-CoV-2 by FDA under an Emergency Use Authorization (EUA). This EUA will remain in effect (meaning this test can be used) for the duration of the COVID-19 declaration under Section 564(b)(1) of the Act, 21 U.S.C. section 360bbb-3(b)(1), unless the authorization is terminated or revoked.  Performed at [facility], [place] [address] [place], [place] [address]   Resp Panel by RT-PCR (Flu A&B, Covid) Nasopharyngeal Swab     Status: Abnormal   Collection Time: 03/31/21  1:01 PM   Specimen: Nasopharyngeal Swab; Nasopharyngeal(NP) swabs in vial transport medium  Result Value Ref Range Status   SARS Coronavirus 2 by RT PCR POSITIVE (A) NEGATIVE Final    Comment: (NOTE) SARS-CoV-2 target nucleic acids are DETECTED.  The SARS-CoV-2 RNA is generally detectable in upper respiratory specimens during the acute phase of infection. Positive results are indicative of the presence of the identified virus, but do not rule out bacterial infection or co-infection with other  pathogens not detected by the test. Clinical correlation with patient history and other diagnostic information is necessary to determine patient infection status. The expected result is Negative.  Fact Sheet for Patients: EntrepreneurPulse.com.au  Fact Sheet for Healthcare Providers: IncredibleEmployment.be  This test is not yet approved or cleared by the [place] FDA and  has been authorized for detection and/or diagnosis of SARS-CoV-2 by FDA under an Emergency Use Authorization (EUA).  This EUA will remain in effect (meaning this test can be used) for the duration of  the COVID-19 declaration under Section 564(b)(1) of the A ct, 21 U.S.C. section 360bbb-3(b)(1), unless the authorization is terminated or revoked sooner.     Influenza A by PCR NEGATIVE NEGATIVE Final   Influenza B by PCR NEGATIVE NEGATIVE Final    Comment: (NOTE) The Xpert Xpress SARS-CoV-2/FLU/RSV plus assay is intended as an aid in the diagnosis of influenza from Nasopharyngeal swab specimens and should not be used as a sole basis for treatment. Nasal washings and  aspirates are unacceptable for Xpert Xpress SARS-CoV-2/FLU/RSV testing.  Fact Sheet for Patients: EntrepreneurPulse.com.au  Fact Sheet for Healthcare Providers: IncredibleEmployment.be  This test is not yet approved or cleared by the [place] FDA and has been authorized for detection and/or diagnosis of SARS-CoV-2 by FDA under an Emergency Use Authorization (EUA). This EUA will remain in effect (meaning this test can be used) for the duration of the COVID-19 declaration under Section 564(b)(1) of the Act, 21 U.S.C. section 360bbb-3(b)(1), unless the authorization is terminated or revoked.  Performed at [facility], [place] [address], [place], [place] [address]        Radiology Studies: No results found.  Scheduled Meds:  amLODipine  10 mg Oral Daily    enoxaparin (LOVENOX) injection  40 mg Subcutaneous Daily   feeding supplement  237 mL Oral TID BM   folic acid  1 mg Oral Daily   levETIRAcetam  500 mg Oral BID   metoprolol tartrate  25 mg Oral BID   multivitamin with minerals  1 tablet Oral Daily   thiamine  100 mg Oral Daily   Continuous Infusions:  sodium chloride Stopped (03/26/21 0155)     LOS: 16 days    Phillips Climes, MD [facility]  If 7PM-7AM, please contact night-coverage [email or web address] 04/08/2021, 12:15 PM  Patient ID: Dennis Levine, male   DOB: 01/06/1951, 70 y.o.   MRN: [id number]

## 2021-04-08 NOTE — Progress Notes (Signed)
Occupational Therapy Treatment Patient Details Name: Dennis Levine MRN: [id number] DOB: 10-05-1950 Today's Date: 04/08/2021   History of present illness 70 yo admitted 12/13 after found unresponsive in bathroom with witnessed seizure and intubated 12/13-12/14. Head CT (-), MRI demonstrated Rt meningioma. PMhx: ETOH abuse, HTN, hep C   OT comments  Pt progressing towards acute OT goals. Focus of session was in-room functional mobility and functional transfers. Up in recliner at end of session. D/c plan remains appropriate.    Recommendations for follow up therapy are one component of a multi-disciplinary discharge planning process, led by the attending physician.  Recommendations may be updated based on patient status, additional functional criteria and insurance authorization.    Follow Up Recommendations  Skilled nursing-short term rehab (<3 hours/day)    Assistance Recommended at Discharge Frequent or constant Supervision/Assistance  Equipment Recommendations  Tub/shower seat    Recommendations for Other Services      Precautions / Restrictions Precautions Precautions: Fall Precaution Comments: Covid+ Air/Contact Restrictions Weight Bearing Restrictions: No       Mobility Bed Mobility Overal bed mobility: Needs Assistance Bed Mobility: Supine to Sit     Supine to sit: Supervision          Transfers Overall transfer level: Needs assistance Equipment used: Rolling walker (2 wheels) Transfers: Sit to/from Stand Sit to Stand: Min guard           General transfer comment: to/from EOB and recliner. pt endorsed using a rw PTA     Balance Overall balance assessment: Needs assistance Sitting-balance support: Feet supported Sitting balance-Leahy Scale: Fair     Standing balance support: Bilateral upper extremity supported;During functional activity Standing balance-Leahy Scale: Fair                             ADL either performed or assessed  with clinical judgement   ADL Overall ADL's : Needs assistance/impaired                         Toilet Transfer: Min guard;Ambulation           Functional mobility during ADLs: Min guard;Rolling walker (2 wheels) General ADL Comments: walked length of bed to sit up in recliner for a bit.    Extremity/Trunk Assessment Upper Extremity Assessment Upper Extremity Assessment: LUE deficits/detail LUE Deficits / Details: chronic L shoulder dysfunction limiting FF   Lower Extremity Assessment Lower Extremity Assessment: Defer to PT evaluation        Vision       Perception     Praxis      Cognition Arousal/Alertness: Awake/alert Behavior During Therapy: [facility] for tasks assessed/performed;Flat affect;Impulsive Overall Cognitive Status: Impaired/Different from baseline Area of Impairment: Following commands;Safety/judgement;Attention;Problem solving                 Orientation Level: Time;Situation Current Attention Level: Sustained Memory: Decreased short-term memory Following Commands: Follows one step commands consistently Safety/Judgement: Decreased awareness of safety;Decreased awareness of deficits Awareness: Emergent Problem Solving: Slow processing;Requires verbal cues            Exercises     Shoulder Instructions       General Comments      Pertinent Vitals/ Pain       Pain Assessment: Faces Faces Pain Scale: Hurts a little bit Pain Location: back Pain Descriptors / Indicators: Aching Pain Intervention(s): Repositioned;Monitored during session;Limited activity within patient's tolerance  Home  Living                                          Prior Functioning/Environment              Frequency  Min 2X/week        Progress Toward Goals  OT Goals(current goals can now be found in the care plan section)  Progress towards OT goals: Progressing toward goals  Acute Rehab OT Goals OT Goal Formulation: With  patient Time For Goal Achievement: 04/22/21 Potential to Achieve Goals: Good ADL Goals Pt Will Perform Grooming: with supervision;standing Pt Will Perform Lower Body Bathing: with supervision;sit to/from stand Pt Will Perform Lower Body Dressing: with supervision;sit to/from stand Pt Will Transfer to Toilet: ambulating;with supervision Pt Will Perform Toileting - Clothing Manipulation and hygiene: with supervision;sit to/from stand  Plan Discharge plan remains appropriate;Frequency remains appropriate    Co-evaluation                 AM-PAC OT "6 Clicks" Daily Activity     Outcome Measure   Help from another person eating meals?: A Little Help from another person taking care of personal grooming?: A Little Help from another person toileting, which includes using toliet, bedpan, or urinal?: A Lot Help from another person bathing (including washing, rinsing, drying)?: A Little Help from another person to put on and taking off regular upper body clothing?: A Little Help from another person to put on and taking off regular lower body clothing?: A Lot 6 Click Score: 16    End of Session Equipment Utilized During Treatment: Rolling walker (2 wheels)  OT Visit Diagnosis: Unsteadiness on feet (R26.81);Other symptoms and signs involving cognitive function   Activity Tolerance Patient tolerated treatment well   Patient Left in chair;with call bell/phone within reach;with chair alarm set   Nurse Communication Other (comment) (NT: urinary cath fell off)        Time: 1040-1100 OT Time Calculation (min): 20 min  Charges: OT General Charges $OT Visit: 1 Visit OT Treatments $Self Care/Home Management : 8-22 mins  Tyrone Schimke, OT Acute Rehabilitation Services Office: [phone]   Hortencia Pilar 04/08/2021, 1:39 PM

## 2021-04-09 NOTE — Progress Notes (Signed)
Physical Therapy Treatment Patient Details Name: Dennis Levine MRN: [id number] DOB: 1950-11-25 Today's Date: 04/09/2021   History of Present Illness 70 yo admitted 12/13 after found unresponsive in bathroom with witnessed seizure and intubated 12/13-12/14. Head CT (-), MRI demonstrated Rt meningioma. PMhx: ETOH abuse, HTN, hep C    PT Comments    Pt continues to progress from mobility stand point however continues to be very cognitively impaired. Pt with impaired memory, inability to follow multistep commands, poor memory, difficulty sequencing, and decreased insight to deficits and safety. Pt unsafe to return home alone. Acute PT to cont to follow.    Recommendations for follow up therapy are one component of a multi-disciplinary discharge planning process, led by the attending physician.  Recommendations may be updated based on patient status, additional functional criteria and insurance authorization.  Follow Up Recommendations  Skilled nursing-short term rehab (<3 hours/day)     Assistance Recommended at Discharge Frequent or constant Supervision/Assistance  Equipment Recommendations  Rolling walker (2 wheels);BSC/3in1    Recommendations for Other Services       Precautions / Restrictions Precautions Precautions: Fall Precaution Comments: Covid+ Air/Contact Restrictions Weight Bearing Restrictions: No     Mobility  Bed Mobility Overal bed mobility: Needs Assistance Bed Mobility: Supine to Sit     Supine to sit: Supervision     General bed mobility comments: cues to initiate and assist with bed linens, pt stopped half way requiring verbal cues to finish task due to impaired attention, ability to stay on task and memory    Transfers Overall transfer level: Needs assistance Equipment used: Rolling walker (2 wheels) Transfers: Sit to/from Stand Sit to Stand: Min guard           General transfer comment: verbal cues for hand placement, min guard for safety     Ambulation/Gait Ambulation/Gait assistance: Min assist Gait Distance (Feet): 60 Feet (limited to room due to COVID+) Assistive device: Rolling walker (2 wheels) Gait Pattern/deviations: Step-through pattern;Decreased stride length;Narrow base of support Gait velocity: dec Gait velocity interpretation: <1.31 ft/sec, indicative of household ambulator   General Gait Details: min guard with RW, constant directional verbal cues to complete ambulation back and forth in the room, pt with no carry over to "walk to the door to the window"   Stairs             Wheelchair Mobility    Modified Rankin (Stroke Patients Only)       Balance Overall balance assessment: Needs assistance Sitting-balance support: Feet supported Sitting balance-Leahy Scale: Fair Sitting balance - Comments: with seated marching, pt with posterior lean/LOB and needs cues for safety and self-support   Standing balance support: During functional activity Standing balance-Leahy Scale: Fair Standing balance comment: pt stood at sink to wash face, groin, and buttocks, pt with some leaning on counter with trunk and occasional unilateral UE support, pt did utilize bilat UE support when using urinal in standing, PT had to manipulate penis into urinal and hold urinal                            Cognition Arousal/Alertness: Awake/alert Behavior During Therapy: [facility] for tasks assessed/performed;Flat affect;Impulsive Overall Cognitive Status: Impaired/Different from baseline Area of Impairment: Following commands;Safety/judgement;Attention;Problem solving;Memory                   Current Attention Level: Sustained Memory: Decreased short-term memory Following Commands: Follows one step commands consistently;Follows multi-step commands inconsistently  Safety/Judgement: Decreased awareness of safety;Decreased awareness of deficits Awareness: Emergent Problem Solving: Slow processing;Requires verbal  cues;Difficulty sequencing General Comments: pt able to follow simple commands however when given multistep commands pt unable to follow ie. walk to the window and turn around, pt would walk to the window and then ask what to do next. ie. take this wash cloth and wash you bottom, pt would take wash cloth and then ask "what do you want me to do with this"        Exercises      General Comments General comments (skin integrity, edema, etc.): VSS on RA      Pertinent Vitals/Pain Pain Assessment: No/denies pain    Home Living                          Prior Function            PT Goals (current goals can now be found in the care plan section) Acute Rehab PT Goals PT Goal Formulation: With patient Time For Goal Achievement: 04/23/21 Potential to Achieve Goals: Fair Progress towards PT goals: Progressing toward goals    Frequency    Min 3X/week      PT Plan Current plan remains appropriate    Co-evaluation              AM-PAC PT "6 Clicks" Mobility   Outcome Measure  Help needed turning from your back to your side while in a flat bed without using bedrails?: A Little Help needed moving from lying on your back to sitting on the side of a flat bed without using bedrails?: A Little Help needed moving to and from a bed to a chair (including a wheelchair)?: A Little Help needed standing up from a chair using your arms (e.g., wheelchair or bedside chair)?: A Lot (constant cues) Help needed to walk in hospital room?: A Lot (constant verbal cues) Help needed climbing 3-5 steps with a railing? : A Lot (constant cues) 6 Click Score: 15    End of Session Equipment Utilized During Treatment: Gait belt Activity Tolerance: Patient tolerated treatment well Patient left: in chair;with call bell/phone within reach;with chair alarm set;Other (comment) Nurse Communication: Mobility status PT Visit Diagnosis: Other abnormalities of gait and mobility (R26.89);Difficulty  in walking, not elsewhere classified (R26.2);Muscle weakness (generalized) (M62.81);Unsteadiness on feet (R26.81)     Time: 0165-5374 PT Time Calculation (min) (ACUTE ONLY): 25 min  Charges:  $Gait Training: 8-22 mins $Therapeutic Activity: 8-22 mins                     Kittie Plater, PT, DPT Acute Rehabilitation Services Pager #: [phone] Office #: [phone]    Berline Lopes 04/09/2021, 2:33 PM

## 2021-04-09 NOTE — Progress Notes (Signed)
PROGRESS NOTE    Dennis Levine  TXH:[id number] DOB: May 26, 1950 DOA: 03/23/2021 PCP: Pcp, No   Brief Narrative:   Patient is a 70 year old male with history of prostate cancer, hepatitis C, hypertension, alcohol use who was found down at home by family on 12/13.  He was observed to be seizing during transfer to ER via EMS.  Code stroke was called.  On presentation he was hypoxic, tachypneic.  Patient was intubated in the ED for concern of ongoing seizure, airway protection, admitted under PCCM service.  Neurology was consulted.  Transferred to [facility] service on 12/16.  Hospital course remarkable for persistent confusion/agitation most likely secondary to alcohol withdrawal.  Respiratory status, mental status has improved and currently at baseline.     PT/OT recommended skilled nursing facility on discharge, was ready for discharge but his COVID test incidentally came out to be positive on 03/30/2021.  Discharge prolonged due to isolation status.  TOC following.  With 10 days isolation will be ready for dc to SNF on 04/10/21.  04/06/2021: Seen at his bedside.  Has no new complaint at this time of the visit.  Awaiting placement.  Assessment & Plan:  New onset seizures:  -Presented with episode of unresponsiveness, left word gaze, seizures followed by left-sided weakness.   -MRI of the brain did not show any acute infarct or intracranial process, no definitive etiology for seizure.  But showed dural based mass along the right bilateral convexity consistent with meningioma.   -EEG showed epileptogenicity from the right frontal region but no active seizures.   -Seen by neurology.  Currently on Keppra.  Will need to follow-up with neurology as an outpatient.   -Neurology thought that alcohol could be the etiology for his seizure.  Currently back to his baseline mentation.   Meningioma:  -He needs to follow-up with neurosurgery as an outpatient.   -No signal change in the brain parenchyma adjacent  to the meningioma making it less likely the cause for his seizure.   Resolved acute hypoxic respiratory failure required intubation:  -Intubated on 12/13 for airway protection.  Extubated on 12/14.  Chest x-ray done on 12/17 showed mild vascular congestion with no focal infiltrate.   -Currently on room air with oxygen saturation of 98%.   Chronic diastolic congestive heart failure:  -Currently euvolemic.  Echo on 03/24/21 showed EF of 65 to 20%, grade 1 diastolic function. -Strict I's and O's and daily weight   Hypertension, stable, at goal:  -Takes amlodipine at home, continue Lopressor 25 mg twice daily, started here in the hospital. -Continue to monitor vital signs   Post ictal state/delirium:  [facility] course remarkable for delirium.  Was given Precedex in the ICU.  He was on Zyprexa which was stopped due to persistent sleepiness/drowsiness.   -Mental status is back to baseline on Keppra.   Hepatitis C/prostate cancer:  -Management as outpatient.   Alcohol withdrawal/alcohol abuse:  -Drinks 6 packs of beer every day.  Counseled for cessation.  Continue multivitamin, thiamine and folic acid.  Given high-dose thiamine.  He was on CIWA protocol, also on phenobarbital, stopped   Debility/deconditioning:  -PT/OT recommending skilled nursing facility on discharge. -TOC assisting with SNF placement   Incidental COVID positive:  -COVID screen test that was done for preparation for  SNF discharge came out to be positive. --Oxygen saturation 98 to 100% room air.  Chest x-ray does not show any infiltrate.   -Continue supportive care.    Lower back pain: -Tylenol as needed  for pain control   Nutrition Problem: Severe Malnutrition Etiology: chronic illness (h/o cancer, hep C)   DVT prophylaxis: Lovenox Code Status: Full code Family Communication: None at bedside, I have discussed with the patient and answered his questions. Disposition Plan: SNF, patient is medically clear for  discharge awaiting SNF bed availability after he finishes quarantine, as well discussed with Education officer, museum, apparently no available beds at SNF over the weekend.  Consultants:  Neurology Neurosurgery PCCM  Procedures:  Intubation EEG   Antimicrobials: None  Status is: Inpatient  Subjective  He denies any complaints today     Objective: Vitals:   04/08/21 2338 04/09/21 0432 04/09/21 0500 04/09/21 0832  BP: 116/74 137/87  127/71  Pulse: 65 70  71  Resp: 16 17  18   Temp: 97.7 F (36.5 C) 98 F (36.7 C)  98.6 F (37 C)  TempSrc:  Oral    SpO2: 100% 100%  99%  Weight:   60.4 kg   Height:        Intake/Output Summary (Last 24 hours) at 04/09/2021 1353 Last data filed at 04/09/2021 1000 Gross per 24 hour  Intake 480 ml  Output 150 ml  Net 330 ml   Filed Weights   04/04/21 0351 04/05/21 0500 04/09/21 0500  Weight: 64.3 kg 60.8 kg 60.4 kg    Examination:  Sitting in bed, in no apparent distress, good air entry bilaterally, regular rate and rhythm.   Data Reviewed: I have personally reviewed following labs and imaging studies  CBC: Recent Labs  Lab 04/08/21 0225  WBC 5.4  HGB 13.1  HCT 39.9  MCV 87.3  PLT 420*    Basic Metabolic Panel: Recent Labs  Lab 04/08/21 0225  NA 132*  K 4.6  CL 101  CO2 24  GLUCOSE 121*  BUN 21  CREATININE 0.63  CALCIUM 9.2    GFR: Estimated Creatinine Clearance: 72.4 mL/min (by C-G formula based on SCr of 0.63 mg/dL). Liver Function Tests: No results for input(s): AST, ALT, ALKPHOS, BILITOT, PROT, ALBUMIN in the last 168 hours. No results for input(s): LIPASE, AMYLASE in the last 168 hours. No results for input(s): AMMONIA in the last 168 hours. Coagulation Profile: No results for input(s): INR, PROTIME in the last 168 hours. Cardiac Enzymes: No results for input(s): CKTOTAL, CKMB, CKMBINDEX, TROPONINI in the last 168 hours. BNP (last 3 results) No results for input(s): PROBNP in the last 8760  hours. HbA1C: No results for input(s): HGBA1C in the last 72 hours. CBG: Recent Labs  Lab 04/02/21 2022 04/03/21 0051 04/03/21 0414 04/03/21 0819 04/04/21 1247  GLUCAP 100* 151* 107* 101* 156*   Lipid Profile: No results for input(s): CHOL, HDL, LDLCALC, TRIG, CHOLHDL, LDLDIRECT in the last 72 hours. Thyroid Function Tests: No results for input(s): TSH, T4TOTAL, FREET4, T3FREE, THYROIDAB in the last 72 hours. Anemia Panel: No results for input(s): VITAMINB12, FOLATE, FERRITIN, TIBC, IRON, RETICCTPCT in the last 72 hours. Sepsis Labs: No results for input(s): PROCALCITON, LATICACIDVEN in the last 168 hours.  Recent Results (from the past 240 hour(s))  Resp Panel by RT-PCR (Flu A&B, Covid) Nasopharyngeal Swab     Status: Abnormal   Collection Time: 03/30/21  3:57 PM   Specimen: Nasopharyngeal Swab; Nasopharyngeal(NP) swabs in vial transport medium  Result Value Ref Range Status   SARS Coronavirus 2 by RT PCR POSITIVE (A) NEGATIVE Final    Comment: (NOTE) SARS-CoV-2 target nucleic acids are DETECTED.  The SARS-CoV-2 RNA is generally detectable in upper respiratory  specimens during the acute phase of infection. Positive results are indicative of the presence of the identified virus, but do not rule out bacterial infection or co-infection with other pathogens not detected by the test. Clinical correlation with patient history and other diagnostic information is necessary to determine patient infection status. The expected result is Negative.  Fact Sheet for Patients: EntrepreneurPulse.com.au  Fact Sheet for Healthcare Providers: IncredibleEmployment.be  This test is not yet approved or cleared by the [place] FDA and  has been authorized for detection and/or diagnosis of SARS-CoV-2 by FDA under an Emergency Use Authorization (EUA).  This EUA will remain in effect (meaning this test can be used) for the duration of  the COVID-19  declaration under Section 564(b)(1) of the A ct, 21 U.S.C. section 360bbb-3(b)(1), unless the authorization is terminated or revoked sooner.     Influenza A by PCR NEGATIVE NEGATIVE Final   Influenza B by PCR NEGATIVE NEGATIVE Final    Comment: (NOTE) The Xpert Xpress SARS-CoV-2/FLU/RSV plus assay is intended as an aid in the diagnosis of influenza from Nasopharyngeal swab specimens and should not be used as a sole basis for treatment. Nasal washings and aspirates are unacceptable for Xpert Xpress SARS-CoV-2/FLU/RSV testing.  Fact Sheet for Patients: EntrepreneurPulse.com.au  Fact Sheet for Healthcare Providers: IncredibleEmployment.be  This test is not yet approved or cleared by the [place] FDA and has been authorized for detection and/or diagnosis of SARS-CoV-2 by FDA under an Emergency Use Authorization (EUA). This EUA will remain in effect (meaning this test can be used) for the duration of the COVID-19 declaration under Section 564(b)(1) of the Act, 21 U.S.C. section 360bbb-3(b)(1), unless the authorization is terminated or revoked.  Performed at [facility], [place] [address] [place], [place] [address]   Resp Panel by RT-PCR (Flu A&B, Covid) Nasopharyngeal Swab     Status: Abnormal   Collection Time: 03/31/21  1:01 PM   Specimen: Nasopharyngeal Swab; Nasopharyngeal(NP) swabs in vial transport medium  Result Value Ref Range Status   SARS Coronavirus 2 by RT PCR POSITIVE (A) NEGATIVE Final    Comment: (NOTE) SARS-CoV-2 target nucleic acids are DETECTED.  The SARS-CoV-2 RNA is generally detectable in upper respiratory specimens during the acute phase of infection. Positive results are indicative of the presence of the identified virus, but do not rule out bacterial infection or co-infection with other pathogens not detected by the test. Clinical correlation with patient history and other diagnostic information is necessary  to determine patient infection status. The expected result is Negative.  Fact Sheet for Patients: EntrepreneurPulse.com.au  Fact Sheet for Healthcare Providers: IncredibleEmployment.be  This test is not yet approved or cleared by the [place] FDA and  has been authorized for detection and/or diagnosis of SARS-CoV-2 by FDA under an Emergency Use Authorization (EUA).  This EUA will remain in effect (meaning this test can be used) for the duration of  the COVID-19 declaration under Section 564(b)(1) of the A ct, 21 U.S.C. section 360bbb-3(b)(1), unless the authorization is terminated or revoked sooner.     Influenza A by PCR NEGATIVE NEGATIVE Final   Influenza B by PCR NEGATIVE NEGATIVE Final    Comment: (NOTE) The Xpert Xpress SARS-CoV-2/FLU/RSV plus assay is intended as an aid in the diagnosis of influenza from Nasopharyngeal swab specimens and should not be used as a sole basis for treatment. Nasal washings and aspirates are unacceptable for Xpert Xpress SARS-CoV-2/FLU/RSV testing.  Fact Sheet for Patients: EntrepreneurPulse.com.au  Fact Sheet for  Healthcare Providers: IncredibleEmployment.be  This test is not yet approved or cleared by the [place] and has been authorized for detection and/or diagnosis of SARS-CoV-2 by FDA under an Emergency Use Authorization (EUA). This EUA will remain in effect (meaning this test can be used) for the duration of the COVID-19 declaration under Section 564(b)(1) of the Act, 21 U.S.C. section 360bbb-3(b)(1), unless the authorization is terminated or revoked.  Performed at [facility], [place] [address], [place], [place] [address]        Radiology Studies: No results found.  Scheduled Meds:  amLODipine  10 mg Oral Daily   enoxaparin (LOVENOX) injection  40 mg Subcutaneous Daily   feeding supplement  237 mL Oral TID BM   folic acid  1 mg Oral  Daily   levETIRAcetam  500 mg Oral BID   metoprolol tartrate  25 mg Oral BID   multivitamin with minerals  1 tablet Oral Daily   thiamine  100 mg Oral Daily   Continuous Infusions:  sodium chloride Stopped (03/26/21 0155)     LOS: 17 days    Phillips Climes, MD [facility]  If 7PM-7AM, please contact night-coverage [email or web address] 04/09/2021, 1:53 PM  Patient ID: Dennis Levine, male   DOB: [birth year], 70 y.o.   MRN: [id number] Patient ID: Dennis Levine, male   DOB: [birth year], 70 y.o.   MRN: [id number]

## 2021-04-09 NOTE — TOC Progression Note (Signed)
Transition of Care [facility]) - Progression Note    Patient Details  Name: FEDOR KAZMIERSKI MRN: [id number] Date of Birth: [birth year]  Transition of Care [facility]) CM/SW [place], [place] Phone Number: 04/09/2021, 11:16 AM  Clinical Narrative:   CSW following for SNF placement. [facility] unfortunately unable to admit patient tomorrow as they do not have a weekend admissions nurse, will follow on Monday for admission. CSW to follow.    Expected Discharge Plan: Skilled Nursing Facility Barriers to Discharge: SNF Covid  Expected Discharge Plan and Services Expected Discharge Plan: [place] In-house Referral: Clinical Social Work   Post Acute Care Choice: [place] Living arrangements for the past 2 months: Single Family Home                                       Social Determinants of Health (SDOH) Interventions    Readmission Risk Interventions No flowsheet data found.

## 2021-04-10 NOTE — Progress Notes (Signed)
PROGRESS NOTE    Dennis Levine  YKZ:[id number] DOB: 08/12/1950 DOA: 03/23/2021 PCP: Pcp, No   Brief Narrative:   Patient is a 70 year old male with history of prostate cancer, hepatitis C, hypertension, alcohol use who was found down at home by family on 12/13.  He was observed to be seizing during transfer to ER via EMS.  Code stroke was called.  On presentation he was hypoxic, tachypneic.  Patient was intubated in the ED for concern of ongoing seizure, airway protection, admitted under PCCM service.  Neurology was consulted.  Transferred to [facility] service on 12/16.  Hospital course remarkable for persistent confusion/agitation most likely secondary to alcohol withdrawal.  Respiratory status, mental status has improved and currently at baseline.     PT/OT recommended skilled nursing facility on discharge, was ready for discharge but his COVID test incidentally came out to be positive on 03/30/2021.  Discharge prolonged due to isolation status.  TOC following.  With 10 days isolation will be ready for dc to SNF on 04/10/21.  04/06/2021: Seen at his bedside.  Has no new complaint at this time of the visit.  Awaiting placement.  Assessment & Plan:  New onset seizures:  -Presented with episode of unresponsiveness, left word gaze, seizures followed by left-sided weakness.   -MRI of the brain did not show any acute infarct or intracranial process, no definitive etiology for seizure.  But showed dural based mass along the right bilateral convexity consistent with meningioma.   -EEG showed epileptogenicity from the right frontal region but no active seizures.   -Seen by neurology.  Currently on Keppra.  Will need to follow-up with neurology as an outpatient.   -Neurology thought that alcohol could be the etiology for his seizure.  Currently back to his baseline mentation.   Meningioma:  -He needs to follow-up with neurosurgery as an outpatient.   -No signal change in the brain parenchyma adjacent  to the meningioma making it less likely the cause for his seizure.   Resolved acute hypoxic respiratory failure required intubation:  -Intubated on 12/13 for airway protection.  Extubated on 12/14.  Chest x-ray done on 12/17 showed mild vascular congestion with no focal infiltrate.   -Currently on room air with oxygen saturation of 98%.   Chronic diastolic congestive heart failure:  -Currently euvolemic.  Echo on 03/24/21 showed EF of 65 to 93%, grade 1 diastolic function. -Strict I's and O's and daily weight   Hypertension, stable, at goal:  -Takes amlodipine at home, continue Lopressor 25 mg twice daily, started here in the hospital. -Continue to monitor vital signs   Post ictal state/delirium:  [facility] course remarkable for delirium.  Was given Precedex in the ICU.  He was on Zyprexa which was stopped due to persistent sleepiness/drowsiness.   -Mental status is back to baseline on Keppra.   Hepatitis C/prostate cancer:  -Management as outpatient.   Alcohol withdrawal/alcohol abuse:  -Drinks 6 packs of beer every day.  Counseled for cessation.  Continue multivitamin, thiamine and folic acid.  Given high-dose thiamine.  He was on CIWA protocol, also on phenobarbital, stopped   Debility/deconditioning:  -PT/OT recommending skilled nursing facility on discharge. -TOC assisting with SNF placement   Incidental COVID positive:  -COVID screen test that was done for preparation for  SNF discharge came out to be positive. --Oxygen saturation 98 to 100% room air.  Chest x-ray does not show any infiltrate.   -Continue supportive care.    Lower back pain: -Tylenol as needed  for pain control   Nutrition Problem: Severe Malnutrition Etiology: chronic illness (h/o cancer, hep C)   DVT prophylaxis: Lovenox Code Status: Full code Family Communication: None at bedside, I have discussed with the patient and answered his questions. Disposition Plan: SNF, patient is medically clear for  discharge awaiting SNF bed availability after he finishes quarantine, as well discussed with Education officer, museum, apparently no available beds at SNF over the weekend.  Consultants:  Neurology Neurosurgery PCCM  Procedures:  Intubation EEG   Antimicrobials: None  Status is: Inpatient  Subjective  He denies any complaints today, he was encouraged to keep using incentive spirometer, and to sit on the chair for few hours.     Objective: Vitals:   04/09/21 2117 04/10/21 0000 04/10/21 0400 04/10/21 0800  BP: 120/66 99/62 118/72 122/68  Pulse: 69 71  68  Resp: 16 16 18    Temp: 98.6 F (37 C) 98.9 F (37.2 C) 98.1 F (36.7 C) 98.6 F (37 C)  TempSrc: Oral Oral Oral Oral  SpO2: 97% 98% 97% 98%  Weight:      Height:        Intake/Output Summary (Last 24 hours) at 04/10/2021 1109 Last data filed at 04/09/2021 1800 Gross per 24 hour  Intake 240 ml  Output 400 ml  Net -160 ml   Filed Weights   04/04/21 0351 04/05/21 0500 04/09/21 0500  Weight: 64.3 kg 60.8 kg 60.4 kg    Examination:  Laying in bed in no apparent distress, regular rate and rhythm, lungs clear to auscultation   Data Reviewed: I have personally reviewed following labs and imaging studies  CBC: Recent Labs  Lab 04/08/21 0225  WBC 5.4  HGB 13.1  HCT 39.9  MCV 87.3  PLT 420*    Basic Metabolic Panel: Recent Labs  Lab 04/08/21 0225  NA 132*  K 4.6  CL 101  CO2 24  GLUCOSE 121*  BUN 21  CREATININE 0.63  CALCIUM 9.2    GFR: Estimated Creatinine Clearance: 72.4 mL/min (by C-G formula based on SCr of 0.63 mg/dL). Liver Function Tests: No results for input(s): AST, ALT, ALKPHOS, BILITOT, PROT, ALBUMIN in the last 168 hours. No results for input(s): LIPASE, AMYLASE in the last 168 hours. No results for input(s): AMMONIA in the last 168 hours. Coagulation Profile: No results for input(s): INR, PROTIME in the last 168 hours. Cardiac Enzymes: No results for input(s): CKTOTAL, CKMB, CKMBINDEX,  TROPONINI in the last 168 hours. BNP (last 3 results) No results for input(s): PROBNP in the last 8760 hours. HbA1C: No results for input(s): HGBA1C in the last 72 hours. CBG: Recent Labs  Lab 04/04/21 1247  GLUCAP 156*   Lipid Profile: No results for input(s): CHOL, HDL, LDLCALC, TRIG, CHOLHDL, LDLDIRECT in the last 72 hours. Thyroid Function Tests: No results for input(s): TSH, T4TOTAL, FREET4, T3FREE, THYROIDAB in the last 72 hours. Anemia Panel: No results for input(s): VITAMINB12, FOLATE, FERRITIN, TIBC, IRON, RETICCTPCT in the last 72 hours. Sepsis Labs: No results for input(s): PROCALCITON, LATICACIDVEN in the last 168 hours.  Recent Results (from the past 240 hour(s))  Resp Panel by RT-PCR (Flu A&B, Covid) Nasopharyngeal Swab     Status: Abnormal   Collection Time: 03/31/21  1:01 PM   Specimen: Nasopharyngeal Swab; Nasopharyngeal(NP) swabs in vial transport medium  Result Value Ref Range Status   SARS Coronavirus 2 by RT PCR POSITIVE (A) NEGATIVE Final    Comment: (NOTE) SARS-CoV-2 target nucleic acids are DETECTED.  The SARS-CoV-2  RNA is generally detectable in upper respiratory specimens during the acute phase of infection. Positive results are indicative of the presence of the identified virus, but do not rule out bacterial infection or co-infection with other pathogens not detected by the test. Clinical correlation with patient history and other diagnostic information is necessary to determine patient infection status. The expected result is Negative.  Fact Sheet for Patients: EntrepreneurPulse.com.au  Fact Sheet for Healthcare Providers: IncredibleEmployment.be  This test is not yet approved or cleared by the [place] FDA and  has been authorized for detection and/or diagnosis of SARS-CoV-2 by FDA under an Emergency Use Authorization (EUA).  This EUA will remain in effect (meaning this test can be used) for the  duration of  the COVID-19 declaration under Section 564(b)(1) of the A ct, 21 U.S.C. section 360bbb-3(b)(1), unless the authorization is terminated or revoked sooner.     Influenza A by PCR NEGATIVE NEGATIVE Final   Influenza B by PCR NEGATIVE NEGATIVE Final    Comment: (NOTE) The Xpert Xpress SARS-CoV-2/FLU/RSV plus assay is intended as an aid in the diagnosis of influenza from Nasopharyngeal swab specimens and should not be used as a sole basis for treatment. Nasal washings and aspirates are unacceptable for Xpert Xpress SARS-CoV-2/FLU/RSV testing.  Fact Sheet for Patients: EntrepreneurPulse.com.au  Fact Sheet for Healthcare Providers: IncredibleEmployment.be  This test is not yet approved or cleared by the [place] FDA and has been authorized for detection and/or diagnosis of SARS-CoV-2 by FDA under an Emergency Use Authorization (EUA). This EUA will remain in effect (meaning this test can be used) for the duration of the COVID-19 declaration under Section 564(b)(1) of the Act, 21 U.S.C. section 360bbb-3(b)(1), unless the authorization is terminated or revoked.  Performed at [facility], [place] [address] [place], [place] [address]        Radiology Studies: No results found.  Scheduled Meds:  amLODipine  10 mg Oral Daily   enoxaparin (LOVENOX) injection  40 mg Subcutaneous Daily   feeding supplement  237 mL Oral TID BM   folic acid  1 mg Oral Daily   levETIRAcetam  500 mg Oral BID   metoprolol tartrate  25 mg Oral BID   multivitamin with minerals  1 tablet Oral Daily   thiamine  100 mg Oral Daily   Continuous Infusions:  sodium chloride Stopped (03/26/21 0155)     LOS: 18 days    Phillips Climes, MD [facility]  If 7PM-7AM, please contact night-coverage [email or web address] 04/10/2021, 11:09 AM  Patient ID: [name], male   DOB: 1950-06-06, 70 y.o.   MRN: [id number] Patient ID: RONDLE LOHSE, male    DOB: 10-25-1950, 70 y.o.   MRN: [id number] He denies any complaints today patient ID: KENG JEWEL, male   DOB: 09-01-1950, 70 y.o.   MRN: [id number]

## 2021-04-11 ENCOUNTER — Encounter (HOSPITAL_COMMUNITY): Payer: Self-pay | Admitting: Internal Medicine

## 2021-04-11 ENCOUNTER — Other Ambulatory Visit: Payer: Self-pay

## 2021-04-11 NOTE — Progress Notes (Signed)
PROGRESS NOTE    CAMIL WILHELMSEN  WHQ:[id number] DOB: 08/03/1950 DOA: 03/23/2021 PCP: Pcp, No   Brief Narrative:   Patient is a 71 year old male with history of prostate cancer, hepatitis C, hypertension, alcohol use who was found down at home by family on 12/13.  He was observed to be seizing during transfer to ER via EMS.  Code stroke was called.  On presentation he was hypoxic, tachypneic.  Patient was intubated in the ED for concern of ongoing seizure, airway protection, admitted under PCCM service.  Neurology was consulted.  Transferred to [facility] service on 12/16.  Hospital course remarkable for persistent confusion/agitation most likely secondary to alcohol withdrawal.  Respiratory status, mental status has improved and currently at baseline.     PT/OT recommended skilled nursing facility on discharge, was ready for discharge but his COVID test incidentally came out to be positive on 03/30/2021.  Discharge prolonged due to isolation status.  TOC following.  With 10 days isolation will be ready for dc to SNF on 04/10/21.patient is currently off isolation, and awaiting for SNF bed availability   Assessment & Plan:  New onset seizures:  -Presented with episode of unresponsiveness, left word gaze, seizures followed by left-sided weakness.   -MRI of the brain did not show any acute infarct or intracranial process, no definitive etiology for seizure.  But showed dural based mass along the right bilateral convexity consistent with meningioma.   -EEG showed epileptogenicity from the right frontal region but no active seizures.   -Seen by neurology.  Currently on Keppra.  Will need to follow-up with neurology as an outpatient.   -Neurology thought that alcohol could be the etiology for his seizure.  Currently back to his baseline mentation.   Meningioma:  -He needs to follow-up with neurosurgery as an outpatient.   -No signal change in the brain parenchyma adjacent to the meningioma making it  less likely the cause for his seizure.   Resolved acute hypoxic respiratory failure required intubation:  -Intubated on 12/13 for airway protection.  Extubated on 12/14.  Chest x-ray done on 12/17 showed mild vascular congestion with no focal infiltrate.   -Currently on room air with oxygen saturation of 98%.   Chronic diastolic congestive heart failure:  -Currently euvolemic.  Echo on 03/24/21 showed EF of 65 to 65%, grade 1 diastolic function. -Strict I's and O's and daily weight   Hypertension, stable, at goal:  -Takes amlodipine at home, continue Lopressor 25 mg twice daily, started here in the hospital. -Continue to monitor vital signs   Post ictal state/delirium:  [facility] course remarkable for delirium.  Was given Precedex in the ICU.  He was on Zyprexa which was stopped due to persistent sleepiness/drowsiness.   -Mental status is back to baseline on Keppra.   Hepatitis C/prostate cancer:  -Management as outpatient.   Alcohol withdrawal/alcohol abuse:  -Drinks 6 packs of beer every day.  Counseled for cessation.  Continue multivitamin, thiamine and folic acid.  Given high-dose thiamine.  He was on CIWA protocol, also on phenobarbital, stopped   Debility/deconditioning:  -PT/OT recommending skilled nursing facility on discharge. -TOC assisting with SNF placement   Incidental COVID positive:  -COVID screen test that was done for preparation for  SNF discharge came out to be positive. --Oxygen saturation 98 to 100% room air.  Chest x-ray does not show any infiltrate.   -Continue supportive care.   -Patient finished his 10 days isolation.   Lower back pain: -Tylenol as needed for pain  control   Nutrition Problem: Severe Malnutrition Etiology: chronic illness (h/o cancer, hep C)   DVT prophylaxis: Lovenox Code Status: Full code Family Communication: None at bedside, I have discussed with the patient and answered his questions. Disposition Plan: SNF, patient is medically  clear for discharge awaiting SNF bed availability after he finishes quarantine, as well discussed with Education officer, museum, apparently no available beds at SNF over the weekend.  Consultants:  Neurology Neurosurgery PCCM  Procedures:  Intubation EEG   Antimicrobials: None  Status is: Inpatient  Subjective  Denies any complaints today, no significant events overnight     Objective: Vitals:   04/11/21 0229 04/11/21 0458 04/11/21 0750 04/11/21 0954  BP: 139/78  115/75 132/69  Pulse: 63  68 76  Resp: 18  18   Temp: (!) 97.4 F (36.3 C)  98.2 F (36.8 C) 97.9 F (36.6 C)  TempSrc: Oral  Oral Oral  SpO2: 100%  100%   Weight:  62.3 kg    Height:        Intake/Output Summary (Last 24 hours) at 04/11/2021 1200 Last data filed at 04/11/2021 0200 Gross per 24 hour  Intake 477 ml  Output 550 ml  Net -73 ml   Filed Weights   04/05/21 0500 04/09/21 0500 04/11/21 0458  Weight: 60.8 kg 60.4 kg 62.3 kg    Examination:  Laying in bed in no apparent distress, lungs clear to auscultation, bowel sounds present,   Data Reviewed: I have personally reviewed following labs and imaging studies  CBC: Recent Labs  Lab 04/08/21 0225  WBC 5.4  HGB 13.1  HCT 39.9  MCV 87.3  PLT 420*    Basic Metabolic Panel: Recent Labs  Lab 04/08/21 0225  NA 132*  K 4.6  CL 101  CO2 24  GLUCOSE 121*  BUN 21  CREATININE 0.63  CALCIUM 9.2    GFR: Estimated Creatinine Clearance: 74.6 mL/min (by C-G formula based on SCr of 0.63 mg/dL). Liver Function Tests: No results for input(s): AST, ALT, ALKPHOS, BILITOT, PROT, ALBUMIN in the last 168 hours. No results for input(s): LIPASE, AMYLASE in the last 168 hours. No results for input(s): AMMONIA in the last 168 hours. Coagulation Profile: No results for input(s): INR, PROTIME in the last 168 hours. Cardiac Enzymes: No results for input(s): CKTOTAL, CKMB, CKMBINDEX, TROPONINI in the last 168 hours. BNP (last 3 results) No results for  input(s): PROBNP in the last 8760 hours. HbA1C: No results for input(s): HGBA1C in the last 72 hours. CBG: Recent Labs  Lab 04/04/21 1247  GLUCAP 156*   Lipid Profile: No results for input(s): CHOL, HDL, LDLCALC, TRIG, CHOLHDL, LDLDIRECT in the last 72 hours. Thyroid Function Tests: No results for input(s): TSH, T4TOTAL, FREET4, T3FREE, THYROIDAB in the last 72 hours. Anemia Panel: No results for input(s): VITAMINB12, FOLATE, FERRITIN, TIBC, IRON, RETICCTPCT in the last 72 hours. Sepsis Labs: No results for input(s): PROCALCITON, LATICACIDVEN in the last 168 hours.  No results found for this or any previous visit (from the past 240 hour(s)).      Radiology Studies: No results found.  Scheduled Meds:  amLODipine  10 mg Oral Daily   enoxaparin (LOVENOX) injection  40 mg Subcutaneous Daily   feeding supplement  237 mL Oral TID BM   folic acid  1 mg Oral Daily   levETIRAcetam  500 mg Oral BID   metoprolol tartrate  25 mg Oral BID   multivitamin with minerals  1 tablet Oral Daily  thiamine  100 mg Oral Daily   Continuous Infusions:  sodium chloride Stopped (03/26/21 0155)     LOS: 85 days    Phillips Climes, MD [facility]  If 7PM-7AM, please contact night-coverage [email or web address] 04/11/2021, 12:00 PM  Patient ID: THEODIS KINSEL, male   DOB: [birth year], 71 y.o.   MRN: [id number] Patient ID: TRYGVE THAL, male   DOB: 1951/01/09, 71 y.o.   MRN: [id number] He denies any complaints today patient ID: SHERYL TOWELL, male   DOB: July 23, 1950, 71 y.o.   MRN: [id number] Patient ID: KEAHI MCCARNEY, male   DOB: Sep 30, 1950, 71 y.o.   MRN: [id number]

## 2021-04-12 NOTE — Progress Notes (Signed)
Physical Therapy Treatment Patient Details Name: Dennis Levine MRN: [id number] DOB: March 07, 1951 Today's Date: 04/12/2021   History of Present Illness 71 yo admitted 12/13 after found unresponsive in bathroom with witnessed seizure and intubated 12/13-12/14. Head CT (-), MRI demonstrated Rt meningioma. PMhx: ETOH abuse, HTN, hep C    PT Comments    Pt received in supine, agreeable to therapy session and with good participation and tolerance for gait and transfer training. Pt with fair static and poor dynamic standing balance without AD, needing up to minA for short gait trial with U HHA. Plan to assess dynamic gait index next session for higher level balance challenge. Pt reports 7/10 modified RPE (Fatigue) at end of session and agreeable to sit up in chair for 1-2 hours. Pt continues to benefit from PT services to progress toward functional mobility goals.    Recommendations for follow up therapy are one component of a multi-disciplinary discharge planning process, led by the attending physician.  Recommendations may be updated based on patient status, additional functional criteria and insurance authorization.  Follow Up Recommendations  Skilled nursing-short term rehab (<3 hours/day)     Assistance Recommended at Discharge Frequent or constant Supervision/Assistance  Equipment Recommendations  Rolling walker (2 wheels);BSC/3in1    Recommendations for Other Services       Precautions / Restrictions Precautions Precautions: Fall Restrictions Weight Bearing Restrictions: No     Mobility  Bed Mobility Overal bed mobility: Needs Assistance Bed Mobility: Supine to Sit     Supine to sit: Supervision     General bed mobility comments: cues to initiate and assist with bed linens, pt stopped half way requiring verbal cues to finish task due to impaired attention, ability to stay on task and memory. Multiple reminders needed to achieve foot flat and anterior scooting to edge of bed.     Transfers Overall transfer level: Needs assistance Equipment used: None Transfers: Sit to/from Stand Sit to Stand: Min guard           General transfer comment: verbal cues for hand placement, min guard for safety to/from EOB and chair    Ambulation/Gait Ambulation/Gait assistance: Min assist Gait Distance (Feet): 75 Feet Assistive device: 1 person hand held assist;None Gait Pattern/deviations: Step-through pattern;Decreased stride length;Narrow base of support;Trunk flexed Gait velocity: dec Gait velocity interpretation: <1.31 ft/sec, indicative of household ambulator   General Gait Details: Pt able to perform without RW but needing occasional HHA vs wall railing, needing up to minA for stability when turning and min guard to minA for forward steps. Pt needs frequent directional cues and reminders for task.      Balance Overall balance assessment: Needs assistance Sitting-balance support: Feet supported Sitting balance-Leahy Scale: Fair Sitting balance - Comments: with seated marching, pt with posterior lean/LOB and needs cues for safety and self-support   Standing balance support: During functional activity Standing balance-Leahy Scale: Fair Standing balance comment: pt stood at sink to wash face and hands, needs some U UE support and min guard for safety            Cognition Arousal/Alertness: Awake/alert Behavior During Therapy: [facility] for tasks assessed/performed;Flat affect;Impulsive Overall Cognitive Status: Impaired/Different from baseline Area of Impairment: Following commands;Safety/judgement;Attention;Problem solving;Memory     Current Attention Level: Sustained Memory: Decreased short-term memory Following Commands: Follows one step commands consistently;Follows multi-step commands inconsistently Safety/Judgement: Decreased awareness of safety;Decreased awareness of deficits Awareness: Emergent Problem Solving: Slow processing;Requires verbal  cues;Difficulty sequencing General Comments: pt able to follow simple commands  however when given multistep commands pt unable to follow, pt also somewhat HoH. Pt cooperative throughout, decreased carryover of safety cues noted with transfers.           General Comments General comments (skin integrity, edema, etc.): no acute s/sx distress, VSS per chart review      Pertinent Vitals/Pain Pain Assessment: No/denies pain Pain Intervention(s): Repositioned     PT Goals (current goals can now be found in the care plan section) Acute Rehab PT Goals Patient Stated Goal: to get better and go to rehab PT Goal Formulation: With patient Time For Goal Achievement: 04/23/21 Potential to Achieve Goals: Fair Progress towards PT goals: Progressing toward goals    Frequency    Min 3X/week      PT Plan Current plan remains appropriate       AM-PAC PT "6 Clicks" Mobility   Outcome Measure  Help needed turning from your back to your side while in a flat bed without using bedrails?: A Little Help needed moving from lying on your back to sitting on the side of a flat bed without using bedrails?: A Little Help needed moving to and from a bed to a chair (including a wheelchair)?: A Little Help needed standing up from a chair using your arms (e.g., wheelchair or bedside chair)?: A Lot (constant cues) Help needed to walk in hospital room?: A Lot (constant verbal cues) Help needed climbing 3-5 steps with a railing? : A Lot (constant cues) 6 Click Score: 15    End of Session Equipment Utilized During Treatment: Gait belt Activity Tolerance: Patient tolerated treatment well Patient left: in chair;with call bell/phone within reach;with chair alarm set Nurse Communication: Mobility status PT Visit Diagnosis: Other abnormalities of gait and mobility (R26.89);Difficulty in walking, not elsewhere classified (R26.2);Muscle weakness (generalized) (M62.81);Unsteadiness on feet (R26.81)     Time:  5697-9480 PT Time Calculation (min) (ACUTE ONLY): 18 min  Charges:  $Gait Training: 8-22 mins                     Ramia Sidney P., PTA Acute Rehabilitation Services Pager: [phone] Office: [place] 04/12/2021, 3:24 PM

## 2021-04-12 NOTE — Progress Notes (Signed)
PROGRESS NOTE    Dennis Levine  JOA:[id number] DOB: 07/30/1950 DOA: 03/23/2021 PCP: Pcp, No   Brief Narrative:   Patient is a 71 year old male with history of prostate cancer, hepatitis C, hypertension, alcohol use who was found down at home by family on 12/13.  He was observed to be seizing during transfer to ER via EMS.  Code stroke was called.  On presentation he was hypoxic, tachypneic.  Patient was intubated in the ED for concern of ongoing seizure, airway protection, admitted under PCCM service.  Neurology was consulted.  Transferred to [place] service on 12/16.  Hospital course remarkable for persistent confusion/agitation most likely secondary to alcohol withdrawal.  Respiratory status, mental status has improved and currently at baseline.     PT/OT recommended skilled nursing facility on discharge, was ready for discharge but his COVID test incidentally came out to be positive on 03/30/2021.  Discharge prolonged due to isolation status.  TOC following.  With 10 days isolation will be ready for dc to SNF on 04/10/21.patient is currently off isolation, and awaiting for SNF bed availability   Assessment & Plan:  New onset seizures:  -Presented with episode of unresponsiveness, left word gaze, seizures followed by left-sided weakness.   -MRI of the brain did not show any acute infarct or intracranial process, no definitive etiology for seizure.  But showed dural based mass along the right bilateral convexity consistent with meningioma.   -EEG showed epileptogenicity from the right frontal region but no active seizures.   -Seen by neurology.  Currently on Keppra.  Will need to follow-up with neurology as an outpatient.   -Neurology thought that alcohol could be the etiology for his seizure.  Currently back to his baseline mentation.   Meningioma:  -He needs to follow-up with neurosurgery as an outpatient.   -No signal change in the brain parenchyma adjacent to the meningioma making it  less likely the cause for his seizure.   Resolved acute hypoxic respiratory failure required intubation:  -Intubated on 12/13 for airway protection.  Extubated on 12/14.  Chest x-ray done on 12/17 showed mild vascular congestion with no focal infiltrate.   -Currently on room air with oxygen saturation of 98%.   Chronic diastolic congestive heart failure:  -Currently euvolemic.  Echo on 03/24/21 showed EF of 65 to 60%, grade 1 diastolic function. -Strict I's and O's and daily weight   Hypertension, stable, at goal:  -Takes amlodipine at home, continue Lopressor 25 mg twice daily, started here in the hospital. -Continue to monitor vital signs   Post ictal state/delirium:  [facility] course remarkable for delirium.  Was given Precedex in the ICU.  He was on Zyprexa which was stopped due to persistent sleepiness/drowsiness.   -Mental status is back to baseline on Keppra.   Hepatitis C/prostate cancer:  -Management as outpatient.   Alcohol withdrawal/alcohol abuse:  -Drinks 6 packs of beer every day.  Counseled for cessation.  Continue multivitamin, thiamine and folic acid.  Given high-dose thiamine.  He was on CIWA protocol, also on phenobarbital, stopped   Debility/deconditioning:  -PT/OT recommending skilled nursing facility on discharge. -TOC assisting with SNF placement   Incidental COVID positive:  -COVID screen test that was done for preparation for  SNF discharge came out to be positive. --Oxygen saturation 98 to 100% room air.  Chest x-ray does not show any infiltrate.   -Continue supportive care.   -Patient finished his 10 days isolation.   Lower back pain: -Tylenol as needed for pain  control   Nutrition Problem: Severe Malnutrition Etiology: chronic illness (h/o cancer, hep C)   DVT prophylaxis: Lovenox Code Status: Full code Family Communication: None at bedside, I have discussed with the patient and answered his questions. Disposition Plan: SNF, patient is medically  ready for discharge, have discussed with social worker, apparently [facility] does not accept any patients today because of new year holiday, likely will be able to accept patient tomorrow.     Consultants:  Neurology Neurosurgery PCCM  Procedures:  Intubation EEG   Antimicrobials: None  Status is: Inpatient  Subjective  Denies any complaints today, no significant events overnight     Objective: Vitals:   04/11/21 2347 04/12/21 0403 04/12/21 0758 04/12/21 1140  BP: 134/79 111/76 134/72 113/73  Pulse: 69 67 66 64  Resp: 17 17 20 18   Temp: 97.7 F (36.5 C) 97.7 F (36.5 C) 98.1 F (36.7 C) 97.8 F (36.6 C)  TempSrc: Oral Oral Oral Oral  SpO2: 100% 100% 99% 99%  Weight:      Height:        Intake/Output Summary (Last 24 hours) at 04/12/2021 1158 Last data filed at 04/12/2021 1143 Gross per 24 hour  Intake 900 ml  Output 620 ml  Net 280 ml   Filed Weights   04/05/21 0500 04/09/21 0500 04/11/21 0458  Weight: 60.8 kg 60.4 kg 62.3 kg    Examination:  Laying in bed, in no apparent distress, lungs are clear.   Data Reviewed: I have personally reviewed following labs and imaging studies  CBC: Recent Labs  Lab 04/08/21 0225  WBC 5.4  HGB 13.1  HCT 39.9  MCV 87.3  PLT 420*    Basic Metabolic Panel: Recent Labs  Lab 04/08/21 0225  NA 132*  K 4.6  CL 101  CO2 24  GLUCOSE 121*  BUN 21  CREATININE 0.63  CALCIUM 9.2    GFR: Estimated Creatinine Clearance: 74.6 mL/min (by C-G formula based on SCr of 0.63 mg/dL). Liver Function Tests: No results for input(s): AST, ALT, ALKPHOS, BILITOT, PROT, ALBUMIN in the last 168 hours. No results for input(s): LIPASE, AMYLASE in the last 168 hours. No results for input(s): AMMONIA in the last 168 hours. Coagulation Profile: No results for input(s): INR, PROTIME in the last 168 hours. Cardiac Enzymes: No results for input(s): CKTOTAL, CKMB, CKMBINDEX, TROPONINI in the last 168 hours. BNP (last 3 results) No results  for input(s): PROBNP in the last 8760 hours. HbA1C: No results for input(s): HGBA1C in the last 72 hours. CBG: No results for input(s): GLUCAP in the last 168 hours.  Lipid Profile: No results for input(s): CHOL, HDL, LDLCALC, TRIG, CHOLHDL, LDLDIRECT in the last 72 hours. Thyroid Function Tests: No results for input(s): TSH, T4TOTAL, FREET4, T3FREE, THYROIDAB in the last 72 hours. Anemia Panel: No results for input(s): VITAMINB12, FOLATE, FERRITIN, TIBC, IRON, RETICCTPCT in the last 72 hours. Sepsis Labs: No results for input(s): PROCALCITON, LATICACIDVEN in the last 168 hours.  No results found for this or any previous visit (from the past 240 hour(s)).      Radiology Studies: No results found.  Scheduled Meds:  amLODipine  10 mg Oral Daily   enoxaparin (LOVENOX) injection  40 mg Subcutaneous Daily   feeding supplement  237 mL Oral TID BM   folic acid  1 mg Oral Daily   levETIRAcetam  500 mg Oral BID   metoprolol tartrate  25 mg Oral BID   multivitamin with minerals  1 tablet Oral  Daily   thiamine  100 mg Oral Daily   Continuous Infusions:  sodium chloride Stopped (03/26/21 0155)     LOS: 20 days    Phillips Climes, MD [facility]  If 7PM-7AM, please contact night-coverage [email or web address] 04/12/2021, 11:58 AM  Patient ID: Dennis Levine, male   DOB: 13-Apr-1950, 71 y.o.   MRN: [id number] Patient ID: Dennis Levine, male   DOB: 1950/05/28, 71 y.o.   MRN: [id number] He denies any complaints today patient ID: Dennis Levine, male   DOB: October 28, 1950, 71 y.o.   MRN: [id number] Patient ID: Dennis Levine, male   DOB: [birth year], 71 y.o.   MRN: [id number] Patient ID: Dennis Levine, male   DOB: 05-15-1950, 71 y.o.   MRN: [id number]

## 2021-04-12 NOTE — TOC Progression Note (Signed)
Transition of Care [facility]) - Progression Note    Patient Details  Name: Dennis Levine MRN: [id number] Date of Birth: [birth year]  Transition of Care [facility]) CM/SW [place], [place] Phone Number: 04/12/2021, 12:32 PM  Clinical Narrative:   CSW spoke with [facility], and they are unable to admit today due to holiday. Plan for admission tomorrow. CSW spoke with patient, he is in agreement. CSW to obtain insurance authorization after updated PT note.     Expected Discharge Plan: Skilled Nursing Facility Barriers to Discharge: Insurance Authorization  Expected Discharge Plan and Services Expected Discharge Plan: [place] In-house Referral: Clinical Social Work   Post Acute Care Choice: [place] Living arrangements for the past 2 months: Single Family Home                                       Social Determinants of Health (SDOH) Interventions    Readmission Risk Interventions No flowsheet data found.

## 2021-04-12 NOTE — Plan of Care (Signed)

## 2021-04-13 MED ORDER — ENSURE ENLIVE PO LIQD: 237.0000 mL | Freq: Three times a day (TID) | ORAL | 12 refills | Status: AC

## 2021-04-13 MED ORDER — ADULT MULTIVITAMIN W/MINERALS CH: 1.0000 | ORAL_TABLET | Freq: Every day | ORAL | Status: AC

## 2021-04-13 MED ORDER — THIAMINE HCL 100 MG PO TABS: 100.0000 mg | ORAL_TABLET | Freq: Every day | ORAL | Status: DC

## 2021-04-13 MED ORDER — ACETAMINOPHEN 325 MG PO TABS: 325.0000 mg | ORAL_TABLET | Freq: Four times a day (QID) | ORAL | Status: DC | PRN

## 2021-04-13 NOTE — Care Management Important Message (Signed)
Important Message  Patient Details  Name: Dennis Levine MRN: [id number] Date of Birth: Oct 19, 1950   Medicare Important Message Given:  Yes     Orbie Pyo 04/13/2021, 2:12 PM

## 2021-04-13 NOTE — Care Management Important Message (Signed)
Important Message  Patient Details  Name: Dennis Levine MRN: [id number] Date of Birth: Aug 29, 1950   Medicare Important Message Given:  Yes     Orbie Pyo 04/13/2021, 2:19 PM

## 2021-04-13 NOTE — Progress Notes (Signed)
Occupational Therapy Treatment Patient Details Name: Dennis Levine MRN: [id number] DOB: April 10, 1951 Today's Date: 04/13/2021   History of present illness 71 yo admitted 12/13 after found unresponsive in bathroom with witnessed seizure and intubated 12/13-12/14. Head CT (-), MRI demonstrated Rt meningioma. PMhx: ETOH abuse, HTN, hep C   OT comments  Pt is progressing well towards his acute goals. Pt completed ADLs while standing at the sink without need for sitting rest break, no LOB. He does require verbal cues for initiating tasks and sequence through multi-step tasks. D/c plan remains appropriate, OT to continue to follow acutely.   Recommendations for follow up therapy are one component of a multi-disciplinary discharge planning process, led by the attending physician.  Recommendations may be updated based on patient status, additional functional criteria and insurance authorization.    Follow Up Recommendations  Skilled nursing-short term rehab (<3 hours/day)    Assistance Recommended at Discharge Frequent or constant Supervision/Assistance  Patient can return home with the following  A little help with walking and/or transfers;Direct supervision/assist for financial management;Direct supervision/assist for medications management;A little help with bathing/dressing/bathroom   Equipment Recommendations  Tub/shower seat    Recommendations for Other Services      Precautions / Restrictions Precautions Precautions: Fall Restrictions Weight Bearing Restrictions: No       Mobility Bed Mobility Overal bed mobility: Needs Assistance Bed Mobility: Supine to Sit     Supine to sit: Supervision     General bed mobility comments: cues to initiate and sustain task    Transfers Overall transfer level: Needs assistance Equipment used: Rolling walker (2 wheels) Transfers: Sit to/from Stand Sit to Stand: Supervision           General transfer comment: 1st attempt pt  unsuccessfully stood by pulling with BUE on RW. vc for proper hand placement with good transition to standing     Balance Overall balance assessment: Needs assistance Sitting-balance support: Feet supported Sitting balance-Leahy Scale: Fair     Standing balance support: No upper extremity supported;During functional activity Standing balance-Leahy Scale: Fair                             ADL either performed or assessed with clinical judgement   ADL Overall ADL's : Needs assistance/impaired     Grooming: Supervision/safety;Standing;Wash/dry hands;Wash/dry face;Applying deodorant;Oral care Grooming Details (indicate cue type and reason): standing at the sink ~10 minutes Upper Body Bathing: Supervision/ safety;Sitting Upper Body Bathing Details (indicate cue type and reason): verbal cues for sequencing and to initiate             Toilet Transfer: Supervision/safety;Rolling walker (2 wheels) Toilet Transfer Details (indicate cue type and reason): verbal cues fro RW management         Functional mobility during ADLs: Supervision/safety General ADL Comments: pt stood at the sink for >10 minutes while completing ADLs. He requires cues to initiate each task    Extremity/Trunk Assessment Upper Extremity Assessment Upper Extremity Assessment: Generalized weakness LUE Deficits / Details: chronic L shoulder dysfunction limiting FF LUE Coordination: WNL   Lower Extremity Assessment Lower Extremity Assessment: Defer to PT evaluation        Vision       Perception     Praxis      Cognition Arousal/Alertness: Awake/alert Behavior During Therapy: [facility] for tasks assessed/performed;Flat affect;Impulsive Overall Cognitive Status: Impaired/Different from baseline Area of Impairment: Following commands;Safety/judgement;Attention;Problem solving;Memory  Orientation Level: Time;Situation Current Attention Level: Sustained Memory: Decreased  short-term memory Following Commands: Follows one step commands consistently;Follows multi-step commands inconsistently Safety/Judgement: Decreased awareness of safety;Decreased awareness of deficits Awareness: Emergent Problem Solving: Slow processing;Requires verbal cues;Difficulty sequencing General Comments: pt unable to sequence throughout a 3 step trail making task: go to sink , wash face, chage gown. Once he sat EOB he required vebal cues stating "okay, whats next" and continued to require cues thrgouhout          Exercises     Shoulder Instructions       General Comments VSS on RA    Pertinent Vitals/ Pain       Pain Assessment: No/denies pain Pain Intervention(s): Monitored during session   Frequency  Min 2X/week        Progress Toward Goals  OT Goals(current goals can now be found in the care plan section)  Progress towards OT goals: Progressing toward goals  Acute Rehab OT Goals Patient Stated Goal: leave this hospital OT Goal Formulation: With patient Time For Goal Achievement: 04/22/21 Potential to Achieve Goals: Good ADL Goals Pt Will Perform Grooming: with supervision;standing Pt Will Perform Lower Body Bathing: with supervision;sit to/from stand Pt Will Perform Lower Body Dressing: with supervision;sit to/from stand Pt Will Transfer to Toilet: ambulating;with supervision Pt Will Perform Toileting - Clothing Manipulation and hygiene: with supervision;sit to/from stand  Plan Discharge plan remains appropriate;Frequency remains appropriate       AM-PAC OT "6 Clicks" Daily Activity     Outcome Measure   Help from another person eating meals?: A Little Help from another person taking care of personal grooming?: A Little Help from another person toileting, which includes using toliet, bedpan, or urinal?: A Lot Help from another person bathing (including washing, rinsing, drying)?: A Little Help from another person to put on and taking off regular  upper body clothing?: A Little Help from another person to put on and taking off regular lower body clothing?: A Lot 6 Click Score: 16    End of Session Equipment Utilized During Treatment: Rolling walker (2 wheels)  OT Visit Diagnosis: Unsteadiness on feet (R26.81);Other symptoms and signs involving cognitive function   Activity Tolerance Patient tolerated treatment well   Patient Left in bed;with bed alarm set;with call bell/phone within reach   Nurse Communication Mobility status        Time: 0962-8366 OT Time Calculation (min): 22 min  Charges: OT General Charges $OT Visit: 1 Visit OT Treatments $Self Care/Home Management : 8-22 mins   Laurene Melendrez A Montrice Montuori 04/13/2021, 3:23 PM

## 2021-04-13 NOTE — Plan of Care (Signed)

## 2021-04-13 NOTE — Discharge Summary (Signed)
Physician Discharge Summary  Dennis Levine URK:[id number] DOB: 31-Dec-1950 DOA: 03/23/2021  PCP: Dennis Levine, No  Admit date: 03/23/2021 Discharge date: 04/13/2021  Admitted From: Home Disposition:  SNF  Recommendations for Outpatient Follow-up:  Patient to follow with neurosurgery and neurology  as an outpatient   Discharge Condition:Stable CODE STATUS:FULL Diet recommendation: Heart Healthy /dysphagia 3 with thin liquids  Brief/Interim Summary:  Patient is a 71 year old male with history of prostate cancer, hepatitis C, hypertension, alcohol use who was found down at home by family on 12/13.  He was observed to be seizing during transfer to ER via EMS.  Code stroke was called.  On presentation he was hypoxic, tachypneic.  Patient was intubated in the ED for concern of ongoing seizure, airway protection, admitted under PCCM service.  Neurology was consulted.  Transferred to [facility] service on 12/16.  Hospital course remarkable for persistent confusion/agitation most likely secondary to alcohol withdrawal.  Respiratory status, mental status has improved and currently at baseline.     PT/OT recommended skilled nursing facility on discharge, was ready for discharge but his COVID test incidentally came out to be positive on 03/30/2021.  Discharge prolonged due to isolation status.  In ED due to new year holidays where SNF was unable to take any patients, patient is being discharged 04/13/2021.  New onset seizures:  -Presented with episode of unresponsiveness, left word gaze, seizures followed by left-sided weakness.   -MRI of the brain did not show any acute infarct or intracranial process, no definitive etiology for seizure.  But showed dural based mass along the right bilateral convexity consistent with meningioma.   -EEG showed epileptogenicity from the right frontal region but no active seizures.   -Seen by neurology.  Currently on Keppra.  Will need to follow-up with neurology as an outpatient.    -Neurology thought that alcohol could be the etiology for his seizure.  Currently back to his baseline mentation.   Meningioma:  -He needs to follow-up with neurosurgery as an outpatient.   -No signal change in the brain parenchyma adjacent to the meningioma making it less likely the cause for his seizure.   Resolved acute hypoxic respiratory failure required intubation:  -Intubated on 12/13 for airway protection.  Extubated on 12/14.  Chest x-ray done on 12/17 showed mild vascular congestion with no focal infiltrate.   -Currently on room air with oxygen saturation of 98%.   Chronic diastolic congestive heart failure:  -Currently euvolemic.  Echo on 03/24/21 showed EF of 65 to 83%, grade 1 diastolic function. -Strict I's and O's and daily weight   Hypertension, stable, at goal:  -Takes amlodipine at home, continue Lopressor 25 mg twice daily, started here in the hospital. -Continue to monitor vital signs   Post ictal state/delirium:  [name] course remarkable for delirium.  Was given Precedex in the ICU.  He was on Zyprexa which was stopped due to persistent sleepiness/drowsiness.   -Mental status is back to baseline on Keppra.   Hepatitis C/prostate cancer:  -Management as outpatient.   Alcohol withdrawal/alcohol abuse:  -Drinks 6 packs of beer every day.  Counseled for cessation.  Continue multivitamin, thiamine and folic acid.  Given high-dose thiamine.  He was on CIWA protocol, also on phenobarbital, stopped   Debility/deconditioning:  -PT/OT recommending skilled nursing facility on discharge. -will SNF placement   Incidental COVID positive:  -COVID screen test that was done for preparation for  SNF discharge came out to be positive. --Oxygen saturation 98 to 100% room air.  Chest x-ray does not show any infiltrate.   -Continue supportive care.   -Patient finished his 10 days isolation.    Lower back pain: -Tylenol as needed for pain control   Nutrition Problem: Severe  Malnutrition Etiology: chronic illness (h/o cancer, hep C)    Discharge Diagnoses:  Principal Problem:   Seizures ([place]) Active Problems:   Protein-calorie malnutrition, severe    Discharge Instructions  Discharge Instructions     Diet - low sodium heart healthy   Complete by: As directed    Diet general   Complete by: As directed    Dysphagia 3 diet   Discharge instructions   Complete by: As directed    1)Please take prescribed medication as instructed 2)Quit alcohol and smoking 3)Follow up with neurosurgery as an outpatient for the evaluation of your meningioma.  Name and number of the provider has been attached.  Call for appointment   Discharge instructions   Complete by: As directed    Follow with Primary MD /SNF physician  Get CBC, CMP, 2 view Chest X ray checked  by Primary MD next visit.    Activity: As tolerated with Full fall precautions use walker/cane & assistance as needed   Disposition Home    Diet: Dysphagia 3 with thin liquid.   On your next visit with your primary care physician please Get Medicines reviewed and adjusted.   Please request your Prim.MD to go over all Hospital Tests and Procedure/Radiological results at the follow up, please get all Hospital records sent to your Prim MD by signing hospital release before you go home.   If you experience worsening of your admission symptoms, develop shortness of breath, life threatening emergency, suicidal or homicidal thoughts you must seek medical attention immediately by calling 911 or calling your MD immediately  if symptoms less severe.  You Must read complete instructions/literature along with all the possible adverse reactions/side effects for all the Medicines you take and that have been prescribed to you. Take any new Medicines after you have completely understood and accpet all the possible adverse reactions/side effects.   Do not drive, operating heavy machinery, perform activities at  heights, swimming or participation in water activities or provide baby sitting services if your were admitted for syncope or siezures until you have seen by Primary MD or a Neurologist and advised to do so again.  Do not drive when taking Pain medications.    Do not take more than prescribed Pain, Sleep and Anxiety Medications  Special Instructions: If you have smoked or chewed Tobacco  in the last 2 yrs please stop smoking, stop any regular Alcohol  and or any Recreational drug use.  Wear Seat belts while driving.   Please note  You were cared for by a hospitalist during your hospital stay. If you have any questions about your discharge medications or the care you received while you were in the hospital after you are discharged, you can call the unit and asked to speak with the hospitalist on call if the hospitalist that took care of you is not available. Once you are discharged, your primary care physician will handle any further medical issues. Please note that NO REFILLS for any discharge medications will be authorized once you are discharged, as it is imperative that you return to your primary care physician (or establish a relationship with a primary care physician if you do not have one) for your aftercare needs so that they can reassess your need for medications and  monitor your lab values.   Increase activity slowly   Complete by: As directed    Increase activity slowly   Complete by: As directed       Allergies as of 04/13/2021   No Known Allergies      Medication List     STOP taking these medications    docusate sodium 100 MG capsule Commonly known as: COLACE   oxyCODONE 5 MG immediate release tablet Commonly known as: Oxy IR/ROXICODONE   sulfamethoxazole-trimethoprim 800-160 MG tablet Commonly known as: BACTRIM DS       TAKE these medications    acetaminophen 325 MG tablet Commonly known as: TYLENOL Take 1 tablet (325 mg total) by mouth every 6 (six) hours  as needed for mild pain (or Fever >/= 101).   amLODipine 10 MG tablet Commonly known as: NORVASC Take 1 tablet (10 mg total) by mouth daily.   feeding supplement Liqd Take 237 mLs by mouth 3 (three) times daily between meals.   folic acid 1 MG tablet Commonly known as: FOLVITE Take 1 tablet (1 mg total) by mouth daily.   levETIRAcetam 500 MG tablet Commonly known as: KEPPRA Take 1 tablet (500 mg total) by mouth 2 (two) times daily.   metoprolol tartrate 25 MG tablet Commonly known as: LOPRESSOR Take 1 tablet (25 mg total) by mouth 2 (two) times daily.   multivitamin with minerals Tabs tablet Take 1 tablet by mouth daily.   polyethylene glycol 17 g packet Commonly known as: MIRALAX / GLYCOLAX Take 17 g by mouth daily as needed for moderate constipation.   thiamine 100 MG tablet Take 1 tablet (100 mg total) by mouth daily.               Durable Medical Equipment  (From admission, onward)           Start     Ordered   04/02/21 0453  For home use only DME Tub bench  Once        04/02/21 7169            Follow-up Information     Consuella Lose, MD. Schedule an appointment as soon as possible for a visit in 1 week(s).   Specialty: Neurosurgery Contact information: [address] [place] [address] [place] [address] [phone]                No Known Allergies  Consultations: PCCM Neurology Neurosurgery   Procedures/Studies: DG Chest 1 View  Result Date: 03/27/2021 CLINICAL DATA:  Shortness of breath EXAM: PORTABLE CHEST 1 VIEW COMPARISON:  03/24/2021 FINDINGS: Gastric catheter and endotracheal tube have been removed in the interval. Lungs are well aerated bilaterally. Mild central vascular prominence is seen without focal infiltrate or edema. No acute bony abnormality is noted. IMPRESSION: Mild vascular congestion without focal infiltrate. Electronically Signed   By: Inez Catalina M.D.   On: 03/27/2021 01:55   DG Abd 1  View  Result Date: 03/24/2021 CLINICAL DATA:  NG placement.  Seizure. EXAM: ABDOMEN - 1 VIEW; PORTABLE CHEST - 1 VIEW COMPARISON:  Chest radiograph dated 03/23/2021. FINDINGS: Endotracheal tube with tip approximately 5 cm above the carina. Enteric tube with tip in the distal stomach. Probable small left pleural effusion. There is left lung base atelectasis or infiltrate. The right lung is clear. No pneumothorax. Top-normal cardiac size. No acute osseous pathology. IMPRESSION: 1. Endotracheal tube above the carina. Enteric tube with tip in the distal stomach. 2. Left lung base atelectasis or  infiltrate. Electronically Signed   By: Anner Crete M.D.   On: 03/24/2021 01:54   MR BRAIN W WO CONTRAST  Result Date: 03/24/2021 CLINICAL DATA:  Seizure, new onset, no history of trauma EXAM: MRI HEAD WITHOUT AND WITH CONTRAST TECHNIQUE: Multiplanar, multiecho pulse sequences of the brain and surrounding structures were obtained without and with intravenous contrast. CONTRAST:  15mL GADAVIST GADOBUTROL 1 MMOL/ML IV SOLN COMPARISON:  No prior MRI, correlation is made with CT head and CTA head neck 03/23/2021 FINDINGS: Brain: No restricted diffusion to suggest acute or subacute infarction. No acute hemorrhage, intraparenchymal mass, mass effect, or midline shift. No abnormal parenchymal enhancement. Redemonstrated extra-axial, dural-based mass along the right parietal convexity, measuring up to 1.6 x 2.2 x 0.9 cm (AP x TR x CC) (series 25, image 10 and series 24, image 8). No signal abnormality in the underlying right parietal lobe to suggest significant mass effect. No hydrocephalus or extra-axial collection. Scattered T2 hyperintense signal in the periventricular white matter, likely the sequela of mild chronic small vessel ischemic disease. Lacunar infarcts in the left basal ganglia. General cerebral volume loss, which is somewhat advanced for age. No signal abnormality in the bilateral hippocampi, which demonstrate  atrophy, right greater than left, with widening of the temporal horns. No heterotopia or evidence of cortical dysgenesis. Vascular: Normal flow voids. Please see CTA head neck for more detailed vascular findings. Skull and upper cervical spine: Normal marrow signal. Sinuses/Orbits: Mucosal thickening in the ethmoid air cells. The orbits are unremarkable. Other: Trace fluid in the right mastoid air cells. IMPRESSION: 1. No infarct or other acute intracranial process. 2. No seizure etiology identified. There is atrophy in the right greater than left hippocampus, with dilatation of the right greater than left temporal horn. 3. Dural based mass along the right parietal convexity, without signal abnormality in the underlying right parietal lobe, consistent with a meningioma. Electronically Signed   By: Merilyn Baba M.D.   On: 03/24/2021 01:26   DG CHEST PORT 1 VIEW  Result Date: 03/31/2021 CLINICAL DATA:  COVID-19 EXAM: PORTABLE CHEST 1 VIEW COMPARISON:  03/27/2021 FINDINGS: Heart is upper limits normal in size. Lungs clear. No effusions. No acute bony abnormality. IMPRESSION: No active disease. Electronically Signed   By: Rolm Baptise M.D.   On: 03/31/2021 12:03   DG Chest Port 1 View  Result Date: 03/24/2021 CLINICAL DATA:  NG placement.  Seizure. EXAM: ABDOMEN - 1 VIEW; PORTABLE CHEST - 1 VIEW COMPARISON:  Chest radiograph dated 03/23/2021. FINDINGS: Endotracheal tube with tip approximately 5 cm above the carina. Enteric tube with tip in the distal stomach. Probable small left pleural effusion. There is left lung base atelectasis or infiltrate. The right lung is clear. No pneumothorax. Top-normal cardiac size. No acute osseous pathology. IMPRESSION: 1. Endotracheal tube above the carina. Enteric tube with tip in the distal stomach. 2. Left lung base atelectasis or infiltrate. Electronically Signed   By: Anner Crete M.D.   On: 03/24/2021 01:54   DG Chest Port 1 View  Result Date:  03/23/2021 CLINICAL DATA:  Intubation EXAM: PORTABLE CHEST 1 VIEW COMPARISON:  10/07/2020 FINDINGS: Endotracheal tube 5 cm above the carina. Normal heart size and vascularity. Bibasilar atelectasis worse on the left. Negative for edema, effusion or pneumothorax. Aorta is atherosclerotic. IMPRESSION: Endotracheal tube 5 cm above the carina. Lower lung volumes with bibasilar atelectasis, worse on the left. Electronically Signed   By: Jerilynn Mages.  Shick M.D.   On: 03/23/2021 12:22   EEG adult  Result Date: 03/23/2021 Lora Havens, MD     03/23/2021  2:24 PM Patient Name: CORNEILUS HEGGIE MRN: [id number] Epilepsy Attending: Lora Havens Referring Physician/Provider: Dr Amie [place] Date: 03/23/2021 Duration: 24.29 mins Patient history:  71 year old being brought in for evaluation for leftward gaze, seizures followed by left-sided weakness. EEG to evaluate for seizure Level of alertness: Asleep AEDs during EEG study: LEV, ativan, propofol Technical aspects: This EEG study was done with scalp electrodes positioned according to the 10-20 International system of electrode placement. Electrical activity was acquired at a sampling rate of 500Hz  and reviewed with a high frequency filter of 70Hz  and a low frequency filter of 1Hz . EEG data were recorded continuously and digitally stored. Description: EEG showed continuous generalized low amplitude 3-6 theta-delta slowing admixed with 15 to 18 Hz generalized beta activity predominantly in bilateral frontocentral regions. Frequent spikes were noted in right frontal region. Hyperventilation and photic stimulation were not performed.   ABNORMALITY -Spike, right frontal region -Continuous slow, generalized IMPRESSION: This study showed evidence of epileptogenicity arising from right frontal region. Additionally there is severe diffuse encephalopathy, nonspecific etiology but most likely due to sedation. No seizures were seen throughout the recording. Dr Rory Percy was notified.  Priyanka [name]   Overnight EEG with video  Result Date: 03/24/2021 Lora Havens, MD     03/25/2021  9:33 AM Patient Name: SIE FORMISANO MRN: [id number] Epilepsy Attending: Lora Havens Referring Physician/Provider: Dr Amie Portland Duration: 03/23/2021 1325 to [place] 03/24/2021 0810 to 03/25/2021 0300  Patient history:  71 year old being brought in for evaluation for leftward gaze, seizures followed by left-sided weakness. EEG to evaluate for seizure  Level of alertness: lethargic  AEDs during EEG study: LEV, ativan, propofol  Technical aspects: This EEG study was done with scalp electrodes positioned according to the 10-20 International system of electrode placement. Electrical activity was acquired at a sampling rate of 500Hz  and reviewed with a high frequency filter of 70Hz  and a low frequency filter of 1Hz . EEG data were recorded continuously and digitally stored.  Description: EEG showed continuous generalized low amplitude 3-6 theta-delta slowing admixed with 15 to 18 Hz generalized beta activity predominantly in bilateral frontocentral regions. Frequent spikes were noted in right frontal region. Hyperventilation and photic stimulation were not performed.   Of note, study was technically difficult due to significant myogenic artifact.  Additionally, machine was unplugged at [place] on 04/09/2024 during patient transferred and not replugged until 0810 on 03/24/2021.  ABNORMALITY -Spike, right frontal region -Continuous slow, generalized  IMPRESSION: This study showed evidence of epileptogenicity arising from right frontal region. Additionally there is severe diffuse encephalopathy, nonspecific etiology but most likely due to sedation. No seizures were seen throughout the recording.  Lora Havens   ECHOCARDIOGRAM COMPLETE  Result Date: 03/24/2021    ECHOCARDIOGRAM REPORT   Patient Name:   KIMO BANCROFT Date of Exam: 03/24/2021 Medical Rec #:  [id number]       Height:       70.0 in Accession  #:    [id number]      Weight:       148.4 lb Date of Birth:  07-16-1950       BSA:          1.839 m Patient Age:    58 years        BP:           125/76 mmHg Patient Gender: M  HR:           79 bpm. Exam Location:  Inpatient Procedure: 2D Echo, Cardiac Doppler and Color Doppler Indications:    Dyspnea R06.00  History:        Patient has no prior history of Echocardiogram examinations.                 Risk Factors:Hypertension.  Sonographer:    Darlina Sicilian RDCS Referring Phys: [place]  1. Left ventricular ejection fraction, by estimation, is 65 to 70%. The left ventricle has normal function. The left ventricle has no regional wall motion abnormalities. Left ventricular diastolic parameters are consistent with Grade I diastolic dysfunction (impaired relaxation).  2. Right ventricular systolic function is normal. The right ventricular size is normal. There is normal pulmonary artery systolic pressure.  3. The mitral valve is grossly normal. No evidence of mitral valve regurgitation. No evidence of mitral stenosis.  4. The aortic valve was not well visualized. Aortic valve regurgitation is not visualized. No aortic stenosis is present.  5. The inferior vena cava is dilated in size with >50% respiratory variability, suggesting right atrial pressure of 8 mmHg. Comparison(s): No prior Echocardiogram. FINDINGS  Left Ventricle: Left ventricular ejection fraction, by estimation, is 65 to 70%. The left ventricle has normal function. The left ventricle has no regional wall motion abnormalities. The left ventricular internal cavity size was normal in size. There is  no left ventricular hypertrophy. Left ventricular diastolic parameters are consistent with Grade I diastolic dysfunction (impaired relaxation). Right Ventricle: The right ventricular size is normal. No increase in right ventricular wall thickness. Right ventricular systolic function is normal. There is normal pulmonary artery  systolic pressure. The tricuspid regurgitant velocity is 2.26 m/s, and  with an assumed right atrial pressure of 8 mmHg, the estimated right ventricular systolic pressure is 16.1 mmHg. Left Atrium: Left atrial size was normal in size. Right Atrium: Right atrial size was normal in size. Pericardium: There is no evidence of pericardial effusion. Mitral Valve: The mitral valve is grossly normal. No evidence of mitral valve regurgitation. No evidence of mitral valve stenosis. Tricuspid Valve: The tricuspid valve is normal in structure. Tricuspid valve regurgitation is mild . No evidence of tricuspid stenosis. Aortic Valve: The aortic valve was not well visualized. Aortic valve regurgitation is not visualized. No aortic stenosis is present. Pulmonic Valve: The pulmonic valve was not well visualized. Pulmonic valve regurgitation is trivial. No evidence of pulmonic stenosis. Aorta: The aortic root and ascending aorta are structurally normal, with no evidence of dilitation. Venous: The inferior vena cava is dilated in size with greater than 50% respiratory variability, suggesting right atrial pressure of 8 mmHg. IAS/Shunts: The atrial septum is grossly normal.  LEFT VENTRICLE PLAX 2D LVIDd:         4.30 cm   Diastology LVIDs:         2.30 cm   LV e' medial:    4.86 cm/s LV PW:         1.00 cm   LV E/e' medial:  11.3 LV IVS:        1.00 cm   LV e' lateral:   6.04 cm/s LVOT diam:     1.90 cm   LV E/e' lateral: 9.1 LV SV:         45 LV SV Index:   25 LVOT Area:     2.84 cm  RIGHT VENTRICLE RV S prime:     11.30 cm/s  TAPSE (M-mode): 2.3 cm LEFT ATRIUM           Index        RIGHT ATRIUM          Index LA diam:      1.90 cm 1.03 cm/m   RA Area:     8.96 cm LA Vol (A2C): 12.3 ml 6.69 ml/m   RA Volume:   15.00 ml 8.16 ml/m LA Vol (A4C): 19.7 ml 10.72 ml/m  AORTIC VALVE LVOT Vmax:   71.60 cm/s LVOT Vmean:  50.600 cm/s LVOT VTI:    0.159 m  AORTA Ao Root diam: 3.10 cm MITRAL VALVE               TRICUSPID VALVE MV Area (PHT):  3.60 cm    TR Peak grad:   20.4 mmHg MV Decel Time: 211 msec    TR Vmax:        226.00 cm/s MV E velocity: 54.85 cm/s MV A velocity: 70.05 cm/s  SHUNTS MV E/A ratio:  0.78        Systemic VTI:  0.16 m                            Systemic Diam: 1.90 cm Rudean Haskell MD Electronically signed by Rudean Haskell MD Signature Date/Time: 03/24/2021/3:14:44 PM    Final    CT HEAD CODE STROKE WO CONTRAST  Result Date: 03/23/2021 CLINICAL DATA:  Code stroke.  Old deficit, acute, stroke suspected EXAM: CT HEAD WITHOUT CONTRAST TECHNIQUE: Contiguous axial images were obtained from the base of the skull through the vertex without intravenous contrast. COMPARISON:  10/07/2020 FINDINGS: Brain: Generalized atrophy. No sign of acute infarction, mass lesion, hemorrhage, hydrocephalus or extra-axial collection. Mild chronic small-vessel ischemic change of the white matter. Old lacunar infarction left caudate. Vascular: There is atherosclerotic calcification of the major vessels at the base of the brain. Skull: Negative Sinuses/Orbits: Clear/normal Other: Right forehead scalp lipoma. ASPECTS [facility] Stroke Program Early CT Score) - Ganglionic level infarction (caudate, lentiform nuclei, internal capsule, insula, M1-M3 cortex): 7 - Supraganglionic infarction (M4-M6 cortex): 3 Total score (0-10 with 10 being normal): 10 IMPRESSION: 1. No acute CT finding. Atrophy. Chronic small-vessel ischemic changes of the white matter. Old left caudate lacunar infarction. 2. ASPECTS is 10 3. These results were communicated to Dr. Rory Percy at 11:19 am on 03/23/2021 by text page via the [facility] messaging system. Electronically Signed   By: Nelson Chimes M.D.   On: 03/23/2021 11:20   CT ANGIO HEAD NECK W WO CM (CODE STROKE)  Result Date: 03/23/2021 CLINICAL DATA:  Stroke. Follow-up. Neurological deficit, acute, stroke suspected. EXAM: CT ANGIOGRAPHY HEAD AND NECK TECHNIQUE: Multidetector CT imaging of the head and neck was performed  using the standard protocol during bolus administration of intravenous contrast. Multiplanar CT image reconstructions and MIPs were obtained to evaluate the vascular anatomy. Carotid stenosis measurements (when applicable) are obtained utilizing NASCET criteria, using the distal internal carotid diameter as the denominator. CONTRAST:  32mL OMNIPAQUE IOHEXOL 350 MG/ML SOLN COMPARISON:  Head CT earlier same day FINDINGS: CTA NECK FINDINGS Aortic arch: Aortic arch is normal. Branching pattern is normal without origin stenosis. Right carotid system: Common carotid artery is widely patent to the bifurcation. There is soft and calcified plaque at the carotid bifurcation and ICA bulb. There is no stenosis. Cervical ICA is patent to the skull base. Left carotid system: Common carotid artery is patent to the  bifurcation with scattered soft plaque but no stenosis. Carotid bifurcation and ICA bulb show calcified more than soft plaque. No stenosis. Cervical ICA is patent beyond that. Vertebral arteries: Calcified plaque at both vertebral artery origins. The right vertebral artery is dominant. No stenosis at the right vertebral artery origin. Severe stenosis of the non dominant left vertebral artery origin. Both vessels patent beyond that through the cervical region to the foramen magnum. Skeleton: Scoliotic curvature convex to the right. Mid cervical spondylosis. Other neck: No mass or lymphadenopathy. Nasal intubation with the tip in the oropharynx. Upper chest: Lung apices are clear. Review of the MIP images confirms the above findings CTA HEAD FINDINGS Anterior circulation: Both internal carotid arteries are widely patent through the skull base. There is atherosclerotic calcification throughout both carotid siphon regions with stenosis estimated at 50% on both sides. The anterior and middle cerebral vessels are patent on both sides. There is moderate stenosis of the left middle cerebral artery inferior division M2 branch.  Distal branch vessels do show some atherosclerotic irregularity. No evidence of large vessel occlusion or correctable proximal stenosis. Posterior circulation: Both vertebral arteries are patent through the foramen magnum. The left terminates in PICA. Dominant right vertebral artery supplies the basilar. Superior cerebellar and posterior cerebral arteries show flow. There is atherosclerotic irregularity in the more distal PCA branches. Venous sinuses: Patent and normal. Anatomic variants: None significant. 15 mm enhancing meningioma in the right posterior parietal region without significant mass-effect upon the brain. Review of the MIP images confirms the above findings IMPRESSION: No large vessel occlusion. Atherosclerotic change at both carotid bifurcations without stenosis. Severe stenosis of the non dominant left vertebral artery origin. Left vertebral artery terminates in PICA. Right vertebral artery widely patent. Atherosclerotic disease in both carotid siphon regions with stenosis estimated at 50% on both sides. Moderate stenosis of the left middle cerebral artery inferior division M2 branch. Atherosclerotic narrowing and irregularity within the more distal branch vessels of the anterior, middle and posterior cerebral artery territories. 15 mm right parietal meningioma without significant mass-effect upon the brain. Electronically Signed   By: Nelson Chimes M.D.   On: 03/23/2021 11:40      Subjective: No significant events overnight, patient denies any complaints.  Discharge Exam: Vitals:   04/13/21 0410 04/13/21 0730  BP: (!) 140/95 105/80  Pulse: 67 66  Resp: 18 18  Temp: 98.3 F (36.8 C) 98 F (36.7 C)  SpO2: 98% 100%   Vitals:   04/12/21 1952 04/12/21 2336 04/13/21 0410 04/13/21 0730  BP: 132/80 122/69 (!) 140/95 105/80  Pulse: 71 73 67 66  Resp: 16 17 18 18   Temp: 98.2 F (36.8 C) 98.6 F (37 C) 98.3 F (36.8 C) 98 F (36.7 C)  TempSrc: Oral Oral Oral Oral  SpO2: 97% 100% 98%  100%  Weight:      Height:        General: Pt is alert, awake, not in acute distress Cardiovascular: RRR, S1/S2 +, no rubs, no gallops Respiratory: CTA bilaterally, no wheezing, no rhonchi Abdominal: Soft, NT, ND, bowel sounds + Extremities: no edema, no cyanosis    The results of significant diagnostics from this hospitalization (including imaging, microbiology, ancillary and laboratory) are listed below for reference.     Microbiology: No results found for this or any previous visit (from the past 240 hour(s)).   Labs: BNP (last 3 results) No results for input(s): BNP in the last 8760 hours. Basic Metabolic Panel: Recent Labs  Lab 04/08/21  0225  NA 132*  K 4.6  CL 101  CO2 24  GLUCOSE 121*  BUN 21  CREATININE 0.63  CALCIUM 9.2   Liver Function Tests: No results for input(s): AST, ALT, ALKPHOS, BILITOT, PROT, ALBUMIN in the last 168 hours. No results for input(s): LIPASE, AMYLASE in the last 168 hours. No results for input(s): AMMONIA in the last 168 hours. CBC: Recent Labs  Lab 04/08/21 0225  WBC 5.4  HGB 13.1  HCT 39.9  MCV 87.3  PLT 420*   Cardiac Enzymes: No results for input(s): CKTOTAL, CKMB, CKMBINDEX, TROPONINI in the last 168 hours. BNP: Invalid input(s): POCBNP CBG: No results for input(s): GLUCAP in the last 168 hours. D-Dimer No results for input(s): DDIMER in the last 72 hours. Hgb A1c No results for input(s): HGBA1C in the last 72 hours. Lipid Profile No results for input(s): CHOL, HDL, LDLCALC, TRIG, CHOLHDL, LDLDIRECT in the last 72 hours. Thyroid function studies No results for input(s): TSH, T4TOTAL, T3FREE, THYROIDAB in the last 72 hours.  Invalid input(s): FREET3 Anemia work up No results for input(s): VITAMINB12, FOLATE, FERRITIN, TIBC, IRON, RETICCTPCT in the last 72 hours. Urinalysis    Component Value Date/Time   COLORURINE YELLOW 10/07/2020 1518   APPEARANCEUR CLEAR 10/07/2020 1518   LABSPEC 1.012 10/07/2020 1518    PHURINE 7.0 10/07/2020 1518   GLUCOSEU NEGATIVE 10/07/2020 1518   HGBUR NEGATIVE 10/07/2020 1518   BILIRUBINUR NEGATIVE 10/07/2020 1518   KETONESUR 20 (A) 10/07/2020 1518   PROTEINUR NEGATIVE 10/07/2020 1518   NITRITE NEGATIVE 10/07/2020 1518   LEUKOCYTESUR NEGATIVE 10/07/2020 1518   Sepsis Labs Invalid input(s): PROCALCITONIN,  WBC,  LACTICIDVEN Microbiology No results found for this or any previous visit (from the past 240 hour(s)).   Time coordinating discharge: 30 minutes  SIGNED:   Phillips Climes, MD  [facility] 04/13/2021, 10:11 AM Pager   If 7PM-7AM, please contact night-coverage [email or web address] Password [email or web address]

## 2021-04-14 NOTE — Discharge Summary (Signed)
Physician Discharge Summary  Dennis Levine IOX:[id number] DOB: August 03, 1950 DOA: 03/23/2021  PCP: Merryl Hacker, No  Admit date: 03/23/2021 Discharge date: 04/14/2021  Admitted From: Home Disposition:  SNF  Recommendations for Outpatient Follow-up:  Patient to follow with neurosurgery and neurology as an outpatient   Discharge Condition:Stable CODE STATUS:FULL Diet recommendation: Heart Healthy /dysphagia 3 with thin liquids   Brief/Interim Summary: Patient is a 71 year old male with history of prostate cancer, hepatitis C, hypertension, alcohol use who was found down at home by family on 12/13.  He was observed to be seizing during transfer to ER via EMS.  Code stroke was called.  On presentation he was hypoxic, tachypneic.  Patient was intubated in the ED for concern of ongoing seizure, airway protection, admitted under PCCM service.  Neurology was consulted.  Transferred to [facility] service on 12/16.  Hospital course remarkable for persistent confusion/agitation most likely secondary to alcohol withdrawal.  Respiratory status, mental status has improved and currently at baseline.    PT/OT recommended skilled nursing facility on discharge, was ready for discharge but his COVID test incidentally came out to be positive on 03/30/2021.  Discharge prolonged due to isolation status.   Hospital course:  New onset seizures:  -Presented with episode of unresponsiveness, left word gaze, seizures followed by left-sided weakness.   -MRI of the brain did not show any acute infarct or intracranial process, no definitive etiology for seizure.  But showed dural based mass along the right bilateral convexity consistent with meningioma.   -EEG showed epileptogenicity from the right frontal region but no active seizures.   -Seen by neurology.  Patient was initiated on [place].  Will need to follow-up with neurology as an outpatient.   -Neurology thought that alcohol could be the etiology for his seizure.  Currently  back to his baseline mentation.   Meningioma:  -He needs to follow-up with neurosurgery as an outpatient.   -No signal change in the brain parenchyma adjacent to the meningioma making it less likely the cause for his seizure.   Resolved acute hypoxic respiratory failure required intubation:  -Intubated on 12/13 for airway protection.  Extubated on 12/14.  Chest x-ray done on 12/17 showed mild vascular congestion with no focal infiltrate.     Chronic diastolic congestive heart failure:  -Currently euvolemic.  Echo on 03/24/21 showed EF of 65 to 26%, grade 1 diastolic function.   Essential hypertension, stable, at goal:  -Takes amlodipine at home, continue Lopressor 25 mg twice daily, started here in the hospital.   Post ictal state/delirium:  [facility] course remarkable for delirium.  Was given Precedex in the ICU.  He was on Zyprexa which was stopped due to persistent sleepiness/drowsiness.   -Mental status is back to baseline.   Hepatitis C/prostate cancer:  -Management as outpatient.   Alcohol withdrawal/alcohol abuse:  -Drinks 6 packs of beer every day.  Counseled for cessation.    Debility/deconditioning:  -PT/OT recommending skilled nursing facility on discharge.   Incidental COVID positive:  -COVID screen test that was done for preparation for  SNF discharge came out to be positive.  Did not have any respiratory symptoms. -Patient finished his 10 days isolation.    Lower back pain: -Tylenol as needed for pain control   Nutrition Problem: Severe Malnutrition Etiology: chronic illness (h/o cancer, hep C)   Patient is stable.  Okay for discharge to SNF today.  Discharge Instructions  Discharge Instructions     Diet - low sodium heart healthy   Complete by:  As directed    Diet general   Complete by: As directed    Dysphagia 3 diet   Discharge instructions   Complete by: As directed    1)Please take prescribed medication as instructed 2)Quit alcohol and  smoking 3)Follow up with neurosurgery as an outpatient for the evaluation of your meningioma.  Name and number of the provider has been attached.  Call for appointment   Discharge instructions   Complete by: As directed    Follow with Primary MD /SNF physician  Get CBC, CMP, 2 view Chest X ray checked  by Primary MD next visit.    Activity: As tolerated with Full fall precautions use walker/cane & assistance as needed   Disposition Home    Diet: Dysphagia 3 with thin liquid.   On your next visit with your primary care physician please Get Medicines reviewed and adjusted.   Please request your Prim.MD to go over all Hospital Tests and Procedure/Radiological results at the follow up, please get all Hospital records sent to your Prim MD by signing hospital release before you go home.   If you experience worsening of your admission symptoms, develop shortness of breath, life threatening emergency, suicidal or homicidal thoughts you must seek medical attention immediately by calling 911 or calling your MD immediately  if symptoms less severe.  You Must read complete instructions/literature along with all the possible adverse reactions/side effects for all the Medicines you take and that have been prescribed to you. Take any new Medicines after you have completely understood and accpet all the possible adverse reactions/side effects.   Do not drive, operating heavy machinery, perform activities at heights, swimming or participation in water activities or provide baby sitting services if your were admitted for syncope or siezures until you have seen by Primary MD or a Neurologist and advised to do so again.  Do not drive when taking Pain medications.    Do not take more than prescribed Pain, Sleep and Anxiety Medications  Special Instructions: If you have smoked or chewed Tobacco  in the last 2 yrs please stop smoking, stop any regular Alcohol  and or any Recreational drug use.  Wear  Seat belts while driving.   Please note  You were cared for by a hospitalist during your hospital stay. If you have any questions about your discharge medications or the care you received while you were in the hospital after you are discharged, you can call the unit and asked to speak with the hospitalist on call if the hospitalist that took care of you is not available. Once you are discharged, your primary care physician will handle any further medical issues. Please note that NO REFILLS for any discharge medications will be authorized once you are discharged, as it is imperative that you return to your primary care physician (or establish a relationship with a primary care physician if you do not have one) for your aftercare needs so that they can reassess your need for medications and monitor your lab values.   Increase activity slowly   Complete by: As directed    Increase activity slowly   Complete by: As directed       Allergies as of 04/14/2021   No Known Allergies      Medication List     STOP taking these medications    docusate sodium 100 MG capsule Commonly known as: COLACE   oxyCODONE 5 MG immediate release tablet Commonly known as: Oxy IR/ROXICODONE   sulfamethoxazole-trimethoprim 800-160  MG tablet Commonly known as: BACTRIM DS       TAKE these medications    acetaminophen 325 MG tablet Commonly known as: TYLENOL Take 1 tablet (325 mg total) by mouth every 6 (six) hours as needed for mild pain (or Fever >/= 101).   amLODipine 10 MG tablet Commonly known as: NORVASC Take 1 tablet (10 mg total) by mouth daily.   feeding supplement Liqd Take 237 mLs by mouth 3 (three) times daily between meals.   folic acid 1 MG tablet Commonly known as: FOLVITE Take 1 tablet (1 mg total) by mouth daily.   levETIRAcetam 500 MG tablet Commonly known as: KEPPRA Take 1 tablet (500 mg total) by mouth 2 (two) times daily.   metoprolol tartrate 25 MG tablet Commonly known  as: LOPRESSOR Take 1 tablet (25 mg total) by mouth 2 (two) times daily.   multivitamin with minerals Tabs tablet Take 1 tablet by mouth daily.   polyethylene glycol 17 g packet Commonly known as: MIRALAX / GLYCOLAX Take 17 g by mouth daily as needed for moderate constipation.   thiamine 100 MG tablet Take 1 tablet (100 mg total) by mouth daily.               Durable Medical Equipment  (From admission, onward)           Start     Ordered   04/02/21 0453  For home use only DME Tub bench  Once        04/02/21 3244            Contact information for follow-up providers     Consuella Lose, MD. Schedule an appointment as soon as possible for a visit in 1 week(s).   Specialty: Neurosurgery Contact information: [address] [place] [address] [phone]              Contact information for after-discharge care     Destination     [facility] Preferred SNF .   Service: Skilled Nursing Contact information: [address] [place] [phone]                    No Known Allergies  Consultations: PCCM Neurology Neurosurgery   Procedures/Studies: DG Chest 1 View  Result Date: 03/27/2021 CLINICAL DATA:  Shortness of breath EXAM: PORTABLE CHEST 1 VIEW COMPARISON:  03/24/2021 FINDINGS: Gastric catheter and endotracheal tube have been removed in the interval. Lungs are well aerated bilaterally. Mild central vascular prominence is seen without focal infiltrate or edema. No acute bony abnormality is noted. IMPRESSION: Mild vascular congestion without focal infiltrate. Electronically Signed   By: Inez Catalina M.D.   On: 03/27/2021 01:55   DG Abd 1 View  Result Date: 03/24/2021 CLINICAL DATA:  NG placement.  Seizure. EXAM: ABDOMEN - 1 VIEW; PORTABLE CHEST - 1 VIEW COMPARISON:  Chest radiograph dated 03/23/2021. FINDINGS: Endotracheal tube with tip approximately 5 cm  above the carina. Enteric tube with tip in the distal stomach. Probable small left pleural effusion. There is left lung base atelectasis or infiltrate. The right lung is clear. No pneumothorax. Top-normal cardiac size. No acute osseous pathology. IMPRESSION: 1. Endotracheal tube above the carina. Enteric tube with tip in the distal stomach. 2. Left lung base atelectasis or infiltrate. Electronically Signed   By: Anner Crete M.D.   On: 03/24/2021 01:54   MR BRAIN W WO CONTRAST  Result Date: 03/24/2021 CLINICAL DATA:  Seizure, new onset, no history of trauma EXAM: MRI HEAD WITHOUT AND WITH CONTRAST TECHNIQUE: Multiplanar, multiecho pulse sequences of the brain and surrounding structures were obtained without and with intravenous contrast. CONTRAST:  37mL GADAVIST GADOBUTROL 1 MMOL/ML IV SOLN COMPARISON:  No prior MRI, correlation is made with CT head and CTA head neck 03/23/2021 FINDINGS: Brain: No restricted diffusion to suggest acute or subacute infarction. No acute hemorrhage, intraparenchymal mass, mass effect, or midline shift. No abnormal parenchymal enhancement. Redemonstrated extra-axial, dural-based mass along the right parietal convexity, measuring up to 1.6 x 2.2 x 0.9 cm (AP x TR x CC) (series 25, image 10 and series 24, image 8). No signal abnormality in the underlying right parietal lobe to suggest significant mass effect. No hydrocephalus or extra-axial collection. Scattered T2 hyperintense signal in the periventricular white matter, likely the sequela of mild chronic small vessel ischemic disease. Lacunar infarcts in the left basal ganglia. General cerebral volume loss, which is somewhat advanced for age. No signal abnormality in the bilateral hippocampi, which demonstrate atrophy, right greater than left, with widening of the temporal horns. No heterotopia or evidence of cortical dysgenesis. Vascular: Normal flow voids. Please see CTA head neck for more detailed vascular findings. Skull and  upper cervical spine: Normal marrow signal. Sinuses/Orbits: Mucosal thickening in the ethmoid air cells. The orbits are unremarkable. Other: Trace fluid in the right mastoid air cells. IMPRESSION: 1. No infarct or other acute intracranial process. 2. No seizure etiology identified. There is atrophy in the right greater than left hippocampus, with dilatation of the right greater than left temporal horn. 3. Dural based mass along the right parietal convexity, without signal abnormality in the underlying right parietal lobe, consistent with a meningioma. Electronically Signed   By: Merilyn Baba M.D.   On: 03/24/2021 01:26   DG CHEST PORT 1 VIEW  Result Date: 03/31/2021 CLINICAL DATA:  COVID-19 EXAM: PORTABLE CHEST 1 VIEW COMPARISON:  03/27/2021 FINDINGS: Heart is upper limits normal in size. Lungs clear. No effusions. No acute bony abnormality. IMPRESSION: No active disease. Electronically Signed   By: Rolm Baptise M.D.   On: 03/31/2021 12:03   DG Chest Port 1 View  Result Date: 03/24/2021 CLINICAL DATA:  NG placement.  Seizure. EXAM: ABDOMEN - 1 VIEW; PORTABLE CHEST - 1 VIEW COMPARISON:  Chest radiograph dated 03/23/2021. FINDINGS: Endotracheal tube with tip approximately 5 cm above the carina. Enteric tube with tip in the distal stomach. Probable small left pleural effusion. There is left lung base atelectasis or infiltrate. The right lung is clear. No pneumothorax. Top-normal cardiac size. No acute osseous pathology. IMPRESSION: 1. Endotracheal tube above the carina. Enteric tube with tip in the distal stomach. 2. Left lung base atelectasis or infiltrate. Electronically Signed   By: Anner Crete M.D.   On: 03/24/2021 01:54   DG Chest Port 1 View  Result Date: 03/23/2021 CLINICAL DATA:  Intubation EXAM: PORTABLE CHEST 1 VIEW COMPARISON:  10/07/2020 FINDINGS: Endotracheal tube 5 cm above the carina. Normal heart size and vascularity. Bibasilar atelectasis worse on the left. Negative for edema,  effusion or pneumothorax. Aorta is atherosclerotic. IMPRESSION: Endotracheal tube 5 cm above the carina. Lower lung volumes with bibasilar atelectasis, worse on the left. Electronically Signed   By: Jerilynn Mages.  Shick M.D.   On: 03/23/2021 12:22   EEG adult  Result Date: 03/23/2021 Lora Havens, MD     03/23/2021  2:24 PM Patient Name: IZAYA NETHERTON MRN: [id number] Epilepsy Attending: Lora Havens Referring  Physician/Provider: Dr Amie [place] Date: 03/23/2021 Duration: 24.29 mins Patient history:  71 year old being brought in for evaluation for leftward gaze, seizures followed by left-sided weakness. EEG to evaluate for seizure Level of alertness: Asleep AEDs during EEG study: LEV, ativan, propofol Technical aspects: This EEG study was done with scalp electrodes positioned according to the 10-20 International system of electrode placement. Electrical activity was acquired at a sampling rate of 500Hz  and reviewed with a high frequency filter of 70Hz  and a low frequency filter of 1Hz . EEG data were recorded continuously and digitally stored. Description: EEG showed continuous generalized low amplitude 3-6 theta-delta slowing admixed with 15 to 18 Hz generalized beta activity predominantly in bilateral frontocentral regions. Frequent spikes were noted in right frontal region. Hyperventilation and photic stimulation were not performed.   ABNORMALITY -Spike, right frontal region -Continuous slow, generalized IMPRESSION: This study showed evidence of epileptogenicity arising from right frontal region. Additionally there is severe diffuse encephalopathy, nonspecific etiology but most likely due to sedation. No seizures were seen throughout the recording. Dr Rory Percy was notified. Priyanka [name]   Overnight EEG with video  Result Date: 03/24/2021 Lora Havens, MD     03/25/2021  9:33 AM Patient Name: MICKEL SCHREUR MRN: [id number] Epilepsy Attending: Lora Havens Referring Physician/Provider: Dr Amie [place] Duration: 03/23/2021 1325 to [place] 03/24/2021 0810 to 03/25/2021 0300  Patient history:  71 year old being brought in for evaluation for leftward gaze, seizures followed by left-sided weakness. EEG to evaluate for seizure  Level of alertness: lethargic  AEDs during EEG study: LEV, ativan, propofol  Technical aspects: This EEG study was done with scalp electrodes positioned according to the 10-20 International system of electrode placement. Electrical activity was acquired at a sampling rate of 500Hz  and reviewed with a high frequency filter of 70Hz  and a low frequency filter of 1Hz . EEG data were recorded continuously and digitally stored.  Description: EEG showed continuous generalized low amplitude 3-6 theta-delta slowing admixed with 15 to 18 Hz generalized beta activity predominantly in bilateral frontocentral regions. Frequent spikes were noted in right frontal region. Hyperventilation and photic stimulation were not performed.   Of note, study was technically difficult due to significant myogenic artifact.  Additionally, machine was unplugged at [place] on 04/09/2024 during patient transferred and not replugged until 0810 on 03/24/2021.  ABNORMALITY -Spike, right frontal region -Continuous slow, generalized  IMPRESSION: This study showed evidence of epileptogenicity arising from right frontal region. Additionally there is severe diffuse encephalopathy, nonspecific etiology but most likely due to sedation. No seizures were seen throughout the recording.  Lora Havens   ECHOCARDIOGRAM COMPLETE  Result Date: 03/24/2021    ECHOCARDIOGRAM REPORT   Patient Name:   ALTO GANDOLFO Date of Exam: 03/24/2021 Medical Rec #:  [id number]       Height:       70.0 in Accession #:    [id number]      Weight:       148.4 lb Date of Birth:  1950-10-13       BSA:          1.839 m Patient Age:    19 years        BP:           125/76 mmHg Patient Gender: M               HR:           79 bpm. Exam Location:  Inpatient  Procedure: 2D Echo, Cardiac  Doppler and Color Doppler Indications:    Dyspnea R06.00  History:        Patient has no prior history of Echocardiogram examinations.                 Risk Factors:Hypertension.  Sonographer:    Darlina Sicilian RDCS Referring Phys: [place]  1. Left ventricular ejection fraction, by estimation, is 65 to 70%. The left ventricle has normal function. The left ventricle has no regional wall motion abnormalities. Left ventricular diastolic parameters are consistent with Grade I diastolic dysfunction (impaired relaxation).  2. Right ventricular systolic function is normal. The right ventricular size is normal. There is normal pulmonary artery systolic pressure.  3. The mitral valve is grossly normal. No evidence of mitral valve regurgitation. No evidence of mitral stenosis.  4. The aortic valve was not well visualized. Aortic valve regurgitation is not visualized. No aortic stenosis is present.  5. The inferior vena cava is dilated in size with >50% respiratory variability, suggesting right atrial pressure of 8 mmHg. Comparison(s): No prior Echocardiogram. FINDINGS  Left Ventricle: Left ventricular ejection fraction, by estimation, is 65 to 70%. The left ventricle has normal function. The left ventricle has no regional wall motion abnormalities. The left ventricular internal cavity size was normal in size. There is  no left ventricular hypertrophy. Left ventricular diastolic parameters are consistent with Grade I diastolic dysfunction (impaired relaxation). Right Ventricle: The right ventricular size is normal. No increase in right ventricular wall thickness. Right ventricular systolic function is normal. There is normal pulmonary artery systolic pressure. The tricuspid regurgitant velocity is 2.26 m/s, and  with an assumed right atrial pressure of 8 mmHg, the estimated right ventricular systolic pressure is 31.5 mmHg. Left Atrium: Left atrial size was normal in size. Right  Atrium: Right atrial size was normal in size. Pericardium: There is no evidence of pericardial effusion. Mitral Valve: The mitral valve is grossly normal. No evidence of mitral valve regurgitation. No evidence of mitral valve stenosis. Tricuspid Valve: The tricuspid valve is normal in structure. Tricuspid valve regurgitation is mild . No evidence of tricuspid stenosis. Aortic Valve: The aortic valve was not well visualized. Aortic valve regurgitation is not visualized. No aortic stenosis is present. Pulmonic Valve: The pulmonic valve was not well visualized. Pulmonic valve regurgitation is trivial. No evidence of pulmonic stenosis. Aorta: The aortic root and ascending aorta are structurally normal, with no evidence of dilitation. Venous: The inferior vena cava is dilated in size with greater than 50% respiratory variability, suggesting right atrial pressure of 8 mmHg. IAS/Shunts: The atrial septum is grossly normal.  LEFT VENTRICLE PLAX 2D LVIDd:         4.30 cm   Diastology LVIDs:         2.30 cm   LV e' medial:    4.86 cm/s LV PW:         1.00 cm   LV E/e' medial:  11.3 LV IVS:        1.00 cm   LV e' lateral:   6.04 cm/s LVOT diam:     1.90 cm   LV E/e' lateral: 9.1 LV SV:         45 LV SV Index:   25 LVOT Area:     2.84 cm  RIGHT VENTRICLE RV S prime:     11.30 cm/s TAPSE (M-mode): 2.3 cm LEFT ATRIUM           Index  RIGHT ATRIUM          Index LA diam:      1.90 cm 1.03 cm/m   RA Area:     8.96 cm LA Vol (A2C): 12.3 ml 6.69 ml/m   RA Volume:   15.00 ml 8.16 ml/m LA Vol (A4C): 19.7 ml 10.72 ml/m  AORTIC VALVE LVOT Vmax:   71.60 cm/s LVOT Vmean:  50.600 cm/s LVOT VTI:    0.159 m  AORTA Ao Root diam: 3.10 cm MITRAL VALVE               TRICUSPID VALVE MV Area (PHT): 3.60 cm    TR Peak grad:   20.4 mmHg MV Decel Time: 211 msec    TR Vmax:        226.00 cm/s MV E velocity: 54.85 cm/s MV A velocity: 70.05 cm/s  SHUNTS MV E/A ratio:  0.78        Systemic VTI:  0.16 m                            Systemic  Diam: 1.90 cm Rudean Haskell MD Electronically signed by Rudean Haskell MD Signature Date/Time: 03/24/2021/3:14:44 PM    Final    CT HEAD CODE STROKE WO CONTRAST  Result Date: 03/23/2021 CLINICAL DATA:  Code stroke.  Old deficit, acute, stroke suspected EXAM: CT HEAD WITHOUT CONTRAST TECHNIQUE: Contiguous axial images were obtained from the base of the skull through the vertex without intravenous contrast. COMPARISON:  10/07/2020 FINDINGS: Brain: Generalized atrophy. No sign of acute infarction, mass lesion, hemorrhage, hydrocephalus or extra-axial collection. Mild chronic small-vessel ischemic change of the white matter. Old lacunar infarction left caudate. Vascular: There is atherosclerotic calcification of the major vessels at the base of the brain. Skull: Negative Sinuses/Orbits: Clear/normal Other: Right forehead scalp lipoma. ASPECTS [facility] Stroke Program Early CT Score) - Ganglionic level infarction (caudate, lentiform nuclei, internal capsule, insula, M1-M3 cortex): 7 - Supraganglionic infarction (M4-M6 cortex): 3 Total score (0-10 with 10 being normal): 10 IMPRESSION: 1. No acute CT finding. Atrophy. Chronic small-vessel ischemic changes of the white matter. Old left caudate lacunar infarction. 2. ASPECTS is 10 3. These results were communicated to Dr. Rory Percy at 11:19 am on 03/23/2021 by text page via the [facility] messaging system. Electronically Signed   By: Nelson Chimes M.D.   On: 03/23/2021 11:20   CT ANGIO HEAD NECK W WO CM (CODE STROKE)  Result Date: 03/23/2021 CLINICAL DATA:  Stroke. Follow-up. Neurological deficit, acute, stroke suspected. EXAM: CT ANGIOGRAPHY HEAD AND NECK TECHNIQUE: Multidetector CT imaging of the head and neck was performed using the standard protocol during bolus administration of intravenous contrast. Multiplanar CT image reconstructions and MIPs were obtained to evaluate the vascular anatomy. Carotid stenosis measurements (when applicable) are obtained  utilizing NASCET criteria, using the distal internal carotid diameter as the denominator. CONTRAST:  50mL OMNIPAQUE IOHEXOL 350 MG/ML SOLN COMPARISON:  Head CT earlier same day FINDINGS: CTA NECK FINDINGS Aortic arch: Aortic arch is normal. Branching pattern is normal without origin stenosis. Right carotid system: Common carotid artery is widely patent to the bifurcation. There is soft and calcified plaque at the carotid bifurcation and ICA bulb. There is no stenosis. Cervical ICA is patent to the skull base. Left carotid system: Common carotid artery is patent to the bifurcation with scattered soft plaque but no stenosis. Carotid bifurcation and ICA bulb show calcified more than soft plaque. No stenosis. Cervical ICA is  patent beyond that. Vertebral arteries: Calcified plaque at both vertebral artery origins. The right vertebral artery is dominant. No stenosis at the right vertebral artery origin. Severe stenosis of the non dominant left vertebral artery origin. Both vessels patent beyond that through the cervical region to the foramen magnum. Skeleton: Scoliotic curvature convex to the right. Mid cervical spondylosis. Other neck: No mass or lymphadenopathy. Nasal intubation with the tip in the oropharynx. Upper chest: Lung apices are clear. Review of the MIP images confirms the above findings CTA HEAD FINDINGS Anterior circulation: Both internal carotid arteries are widely patent through the skull base. There is atherosclerotic calcification throughout both carotid siphon regions with stenosis estimated at 50% on both sides. The anterior and middle cerebral vessels are patent on both sides. There is moderate stenosis of the left middle cerebral artery inferior division M2 branch. Distal branch vessels do show some atherosclerotic irregularity. No evidence of large vessel occlusion or correctable proximal stenosis. Posterior circulation: Both vertebral arteries are patent through the foramen magnum. The left  terminates in PICA. Dominant right vertebral artery supplies the basilar. Superior cerebellar and posterior cerebral arteries show flow. There is atherosclerotic irregularity in the more distal PCA branches. Venous sinuses: Patent and normal. Anatomic variants: None significant. 15 mm enhancing meningioma in the right posterior parietal region without significant mass-effect upon the brain. Review of the MIP images confirms the above findings IMPRESSION: No large vessel occlusion. Atherosclerotic change at both carotid bifurcations without stenosis. Severe stenosis of the non dominant left vertebral artery origin. Left vertebral artery terminates in PICA. Right vertebral artery widely patent. Atherosclerotic disease in both carotid siphon regions with stenosis estimated at 50% on both sides. Moderate stenosis of the left middle cerebral artery inferior division M2 branch. Atherosclerotic narrowing and irregularity within the more distal branch vessels of the anterior, middle and posterior cerebral artery territories. 15 mm right parietal meningioma without significant mass-effect upon the brain. Electronically Signed   By: Nelson Chimes M.D.   On: 03/23/2021 11:40       Discharge Exam: Vitals:   04/14/21 0417 04/14/21 0808  BP: 136/77 128/76  Pulse: 72 65  Resp: 16 15  Temp: 98.5 F (36.9 C) 98.5 F (36.9 C)  SpO2: 100% 98%   Vitals:   04/13/21 1945 04/14/21 0000 04/14/21 0417 04/14/21 0808  BP: 118/64 129/77 136/77 128/76  Pulse: 73 74 72 65  Resp: 18 18 16 15   Temp: 98.6 F (37 C) 98.3 F (36.8 C) 98.5 F (36.9 C) 98.5 F (36.9 C)  TempSrc: Oral Oral Oral Oral  SpO2: 97% 99% 100% 98%  Weight:      Height:        General appearance: Awake alert.  In no distress Resp: Clear to auscultation bilaterally.  Normal effort Cardio: S1-S2 is normal regular.  No S3-S4.  No rubs murmurs or bruit GI: Abdomen is soft.  Nontender nondistended.  Bowel sounds are present normal.  No masses  organomegaly     The results of significant diagnostics from this hospitalization (including imaging, microbiology, ancillary and laboratory) are listed below for reference.      Labs:  Basic Metabolic Panel: Recent Labs  Lab 04/08/21 0225  NA 132*  K 4.6  CL 101  CO2 24  GLUCOSE 121*  BUN 21  CREATININE 0.63  CALCIUM 9.2     CBC: Recent Labs  Lab 04/08/21 0225  WBC 5.4  HGB 13.1  HCT 39.9  MCV 87.3  PLT 420*  Time coordinating discharge: 35 minutes    Bonnielee Haff, MD  [facility] 04/14/2021, 10:16 AM

## 2021-04-14 NOTE — Plan of Care (Signed)

## 2021-04-14 NOTE — TOC Progression Note (Signed)
Transition of Care [facility]) - Progression Note    Patient Details  Name: Dennis Levine MRN: [id number] Date of Birth: 1950-11-11  [facility] CM/SW [place], [place] Phone Number: 04/14/2021, 10:34 AM  Clinical Narrative:   CSW following for discharge to SNF. Authorization was started this morning, and is still pending at this time. CSW attempted to reach [employer] to ask about approval, but sat on hold with no answer. CSW to follow.    Expected Discharge Plan: Skilled Nursing Facility Barriers to Discharge: Insurance Authorization  Expected Discharge Plan and Services Expected Discharge Plan: [place] In-house Referral: Clinical Social Work   Post Acute Care Choice: [place] Living arrangements for the past 2 months: Single Family Home Expected Discharge Date: 04/13/21                                     Social Determinants of Health (SDOH) Interventions    Readmission Risk Interventions No flowsheet data found.

## 2021-04-14 NOTE — TOC Transition Note (Signed)
Transition of Care [facility]) - CM/SW Discharge Note   Patient Details  Name: Dennis Levine MRN: [id number] Date of Birth: November 03, 1950  [facility] Care [facility] CM/SW Contact:  Geralynn Ochs, LCSW Phone Number: 04/14/2021, 10:36 AM   Clinical Narrative:   Nurse to call report to [phone].    Final next level of care: Skilled Nursing Facility Barriers to Discharge: Barriers Resolved   Patient Goals and CMS Choice Patient states their goals for this hospitalization and ongoing recovery are:: Rehab CMS Medicare.gov Compare Post Acute Care list provided to:: Patient Choice offered to / list presented to : Patient  Discharge Placement              Patient chooses bed at: [facility] Patient to be transferred to facility by: [place] Name of family member notified: Self Patient and family notified of of transfer: 04/14/21  Discharge Plan and Services In-house Referral: Clinical Social Work   Post Acute Care Choice: [place]                               Social Determinants of Health (SDOH) Interventions     Readmission Risk Interventions No flowsheet data found.

## 2021-04-14 NOTE — Progress Notes (Signed)
Physical Therapy Treatment Patient Details Name: Dennis Levine MRN: [id number] DOB: October 17, 1950 Today's Date: 04/14/2021   History of Present Illness 71 yo admitted 12/13 after found unresponsive in bathroom with witnessed seizure and intubated 12/13-12/14. Head CT (-), MRI demonstrated Rt meningioma. PMhx: ETOH abuse, HTN, hep C    PT Comments    Pt making gradual progress but continues to need mod A for sequencing and sustaining/completing task.  He participated well with mobility and balance training.  Worked on progression to no AD with balance task.  Continue plan of care.     Recommendations for follow up therapy are one component of a multi-disciplinary discharge planning process, led by the attending physician.  Recommendations may be updated based on patient status, additional functional criteria and insurance authorization.  Follow Up Recommendations  Skilled nursing-short term rehab (<3 hours/day)     Assistance Recommended at Discharge Frequent or constant Supervision/Assistance  Patient can return home with the following A little help with walking and/or transfers;A little help with bathing/dressing/bathroom;Assistance with cooking/housework;Direct supervision/assist for financial management;Direct supervision/assist for medications management   Equipment Recommendations  Rolling walker (2 wheels);BSC/3in1    Recommendations for Other Services       Precautions / Restrictions Precautions Precautions: Fall     Mobility  Bed Mobility Overal bed mobility: Needs Assistance Bed Mobility: Supine to Sit     Supine to sit: Supervision     General bed mobility comments: cues to initiate and sustain/complete task    Transfers Overall transfer level: Needs assistance Equipment used: Rolling walker (2 wheels) Transfers: Sit to/from Stand Sit to Stand: Supervision           General transfer comment: Close supervision; performed x 5     Ambulation/Gait Ambulation/Gait assistance: Min guard Gait Distance (Feet): 100 Feet Assistive device: Rolling walker (2 wheels) Gait Pattern/deviations: Step-through pattern;Decreased stride length Gait velocity: decreased     General Gait Details: Cues for safety and RW use   Stairs             Wheelchair Mobility    Modified Rankin (Stroke Patients Only)       Balance Overall balance assessment: Needs assistance Sitting-balance support: Feet supported Sitting balance-Leahy Scale: Good     Standing balance support: No upper extremity supported;Bilateral upper extremity supported Standing balance-Leahy Scale: Fair Standing balance comment: Ambulated with RW but did perform balance task in room without support see below               High Level Balance Comments: Worked on reaching all directions both hands - pt not able to go 2" outside BOS; turned in circles, forward/back/side steps all without UE support and with min guard            Cognition Arousal/Alertness: Awake/alert Behavior During Therapy: Flat affect Overall Cognitive Status: Impaired/Different from baseline Area of Impairment: Following commands;Safety/judgement;Attention;Problem solving;Memory                 Orientation Level: Time;Situation Current Attention Level: Sustained Memory: Decreased short-term memory Following Commands: Follows one step commands consistently;Follows multi-step commands inconsistently Safety/Judgement: Decreased awareness of safety;Decreased awareness of deficits Awareness: Emergent Problem Solving: Slow processing;Requires verbal cues;Difficulty sequencing General Comments: Unable to sequence 3 task together, frequently ask "what do you want me to do"; repeated cues for complex taks with demos        Exercises Other Exercises Other Exercises: Standing marching and heel raises without UE support - min guard  General Comments         Pertinent Vitals/Pain Pain Assessment: No/denies pain    Home Living                          Prior Function            PT Goals (current goals can now be found in the care plan section) Progress towards PT goals: Progressing toward goals    Frequency    Min 3X/week      PT Plan Current plan remains appropriate    Co-evaluation              AM-PAC PT "6 Clicks" Mobility   Outcome Measure  Help needed turning from your back to your side while in a flat bed without using bedrails?: A Little Help needed moving from lying on your back to sitting on the side of a flat bed without using bedrails?: A Little Help needed moving to and from a bed to a chair (including a wheelchair)?: A Little Help needed standing up from a chair using your arms (e.g., wheelchair or bedside chair)?: A Lot (cues) Help needed to walk in hospital room?: A Lot Help needed climbing 3-5 steps with a railing? : A Lot 6 Click Score: 15    End of Session Equipment Utilized During Treatment: Gait belt Activity Tolerance: Patient tolerated treatment well Patient left: in chair;with call bell/phone within reach;with chair alarm set Nurse Communication: Mobility status PT Visit Diagnosis: Other abnormalities of gait and mobility (R26.89);Difficulty in walking, not elsewhere classified (R26.2);Muscle weakness (generalized) (M62.81);Unsteadiness on feet (R26.81)     Time: 3524-8185 PT Time Calculation (min) (ACUTE ONLY): 20 min  Charges:  $Neuromuscular Re-education: 8-22 mins                     Abran Richard, PT Acute Rehab Services Pager [phone] [facility] [phone]    Dennis Levine 04/14/2021, 11:29 AM

## 2021-04-15 ENCOUNTER — Encounter: Payer: Self-pay | Admitting: Adult Health

## 2021-04-15 ENCOUNTER — Non-Acute Institutional Stay (SKILLED_NURSING_FACILITY): Payer: Medicare Other | Admitting: Adult Health

## 2021-04-15 DIAGNOSIS — I10 Essential (primary) hypertension: Secondary | ICD-10-CM

## 2021-04-15 DIAGNOSIS — D329 Benign neoplasm of meninges, unspecified: Secondary | ICD-10-CM

## 2021-04-15 DIAGNOSIS — R5381 Other malaise: Secondary | ICD-10-CM

## 2021-04-15 DIAGNOSIS — F1010 Alcohol abuse, uncomplicated: Secondary | ICD-10-CM | POA: Diagnosis not present

## 2021-04-15 DIAGNOSIS — M5442 Lumbago with sciatica, left side: Secondary | ICD-10-CM

## 2021-04-15 DIAGNOSIS — R569 Unspecified convulsions: Secondary | ICD-10-CM

## 2021-04-15 DIAGNOSIS — G8929 Other chronic pain: Secondary | ICD-10-CM

## 2021-04-15 DIAGNOSIS — U071 COVID-19: Secondary | ICD-10-CM

## 2021-04-15 NOTE — Progress Notes (Signed)
Location:  [facility] Room Number: [place] of Service:  SNF (31) Provider:  Durenda Age, DNP, FNP-BC  Patient Care Team: Pcp, No as PCP - General  Extended Emergency Contact Information Primary Emergency Contact: Ar[facility] [employer] Phone: [phone] Relation: Son Secondary Emergency Contact: [facility] Phone: [phone] Relation: Niece  Code Status:   Full Code  Goals of care: Advanced Directive information Advanced Directives 04/11/2021  Does Patient Have a Medical Advance Directive? No  Would patient like information on creating a medical advance directive? No - Patient declined     Chief Complaint  Patient presents with   Hospitalization Follow-up    HPI:  Pt is a 71 y.o. male who was admitted to [facility] on 04/14/20 post hospitalization 03/23/21 to 04/14/21. He has a PMH of prostate cancer, hepatitis C, hypertension and alcohol use.  He was found unresponsive in the bathroom, when EMS arrived, he was noted to be seizing.  He was hypoxic and tachypneic upon arrival to the ED.  He was intubated in the ED on 12/13 for concern of ongoing seizure and airway protection.  MRI of the brain did not show any acute infarct or intracranial process but showed dural based mass along the right bilateral convexity consistent with meningioma.  EEG showed epileptogenicity from the right frontal region but no active seizures.  Neurology was consulted and was started on Keppra.  Neurology thought that alcohol could be the etiology for his seizure.  He was extubated on 12/14.  Hospitalization was remarkable for persistent confusion/agitation most likely secondary to alcohol withdrawal.  Past Medical History:  Diagnosis Date   Cancer [facility])    prostate   Hepatitis    hepatitis c    Hypertension    Past Surgical History:  Procedure Laterality Date   4th index finger straightened  1990's   finger surgery for injury   1970's right hand   PELVIC LYMPH NODE DISSECTION N/A 02/03/2016   Procedure: PELVIC LYMPH NODE DISSECTION;  Surgeon: Ardis Hughs, MD;  Location: [facility] ORS;  Service: Urology;  Laterality: N/A;   ROBOT ASSISTED LAPAROSCOPIC RADICAL PROSTATECTOMY N/A 02/03/2016   Procedure: XI ROBOTIC ASSISTED LAPAROSCOPIC RADICAL PROSTATECTOMY;  Surgeon: Ardis Hughs, MD;  Location: [facility];  Service: Urology;  Laterality: N/A;    No Known Allergies  Outpatient Encounter Medications as of 04/15/2021  Medication Sig   acetaminophen (TYLENOL) 325 MG tablet Take 1 tablet (325 mg total) by mouth every 6 (six) hours as needed for mild pain (or Fever >/= 101).   amLODipine (NORVASC) 10 MG tablet Take 1 tablet (10 mg total) by mouth daily.   feeding supplement (ENSURE ENLIVE / ENSURE PLUS) LIQD Take 237 mLs by mouth 3 (three) times daily between meals.   folic acid (FOLVITE) 1 MG tablet Take 1 tablet (1 mg total) by mouth daily.   levETIRAcetam (KEPPRA) 500 MG tablet Take 1 tablet (500 mg total) by mouth 2 (two) times daily.   metoprolol tartrate (LOPRESSOR) 25 MG tablet Take 1 tablet (25 mg total) by mouth 2 (two) times daily.   Multiple Vitamin (MULTIVITAMIN WITH MINERALS) TABS tablet Take 1 tablet by mouth daily.   polyethylene glycol (MIRALAX / GLYCOLAX) 17 g packet Take 17 g by mouth daily as needed for moderate constipation.   thiamine 100 MG tablet Take 1 tablet (100 mg total) by mouth daily.   No facility-administered encounter medications on file as of 04/15/2021.    Review of  Systems  Constitutional:  Negative for activity change, appetite change and fever.  HENT:  Negative for sore throat.   Eyes: Negative.   Cardiovascular:  Negative for chest pain and leg swelling.  Gastrointestinal:  Negative for abdominal distention, diarrhea and vomiting.  Genitourinary:  Negative for dysuria, frequency and urgency.  Skin:  Negative for color change.  Neurological:  Negative for dizziness and headaches.   Psychiatric/Behavioral:  Negative for behavioral problems and sleep disturbance. The patient is not nervous/anxious.       Immunization History  Administered Date(s) Administered   [employer] Vaccination 06/03/2019   Pneumococcal Polysaccharide-23 08/31/2015   Tdap 08/31/2015   Pertinent  Health Maintenance Due  Topic Date Due   COLONOSCOPY (Pts 45-42yrs Insurance coverage will need to be confirmed)  Never done   INFLUENZA VACCINE  Never done   Fall Risk 04/12/2021 04/12/2021 04/13/2021 04/13/2021 04/14/2021  Falls in the past year? - - - - -  Patient Fall Risk Level High fall risk High fall risk High fall risk High fall risk High fall risk     Vitals:   04/15/21 0100  BP: (!) 142/76  Pulse: 73  Resp: 20  Temp: (!) 97.5 F (36.4 C)  Weight: 143 lb (64.9 kg)  Height: 5\' 10"  (1.778 m)   Body mass index is 20.52 kg/m.  Physical Exam Constitutional:      Appearance: Normal appearance.  HENT:     Head: Normocephalic and atraumatic.     Mouth/Throat:     Mouth: Mucous membranes are moist.  Eyes:     Conjunctiva/sclera: Conjunctivae normal.  Cardiovascular:     Rate and Rhythm: Normal rate and regular rhythm.     Pulses: Normal pulses.     Heart sounds: Normal heart sounds.  Pulmonary:     Effort: Pulmonary effort is normal.     Breath sounds: Normal breath sounds.  Abdominal:     General: Bowel sounds are normal.     Palpations: Abdomen is soft.  Musculoskeletal:        General: No swelling. Normal range of motion.     Cervical back: Normal range of motion.  Skin:    General: Skin is warm and dry.  Neurological:     General: No focal deficit present.     Mental Status: He is alert and oriented to person, place, and time.  Psychiatric:        Mood and Affect: Mood normal.        Behavior: Behavior normal.       Labs reviewed: Recent Labs    03/23/21 1340 03/24/21 0455 03/24/21 0621 03/25/21 0232 03/26/21 0150 03/28/21 0703 03/29/21 0347  04/08/21 0225  NA  --    < > 134* 136   < > 134* 133* 132*  K  --    < > 3.7 3.7   < > 4.1 4.0 4.6  CL  --   --  103 106   < > 101 102 101  CO2  --   --  25 23   < > 24 24 24   GLUCOSE  --   --  108* 105*   < > 108* 89 121*  BUN  --   --  9 9   < > 13 18 21   CREATININE  --   --  0.86 0.73   < > 0.79 0.79 0.63  CALCIUM  --   --  8.2* 8.7*   < > 9.0 8.4* 9.2  MG 1.8  --  1.7 1.7  --   --   --   --   PHOS 3.3  --  3.7  --   --   --   --   --    < > = values in this interval not displayed.   Recent Labs    10/07/20 0943 03/23/21 1107 03/25/21 0232  AST 66* 48* 65*  ALT 45* 32 36  ALKPHOS 58 69 54  BILITOT 1.6* 0.6 1.3*  PROT 7.7 7.0 6.0*  ALBUMIN 3.9 3.5 2.7*   Recent Labs    03/23/21 1107 03/23/21 1110 03/25/21 0232 03/26/21 0150 03/28/21 0703 03/29/21 0347 04/08/21 0225  WBC 7.0   < > 8.2   < > 5.8 5.3 5.4  NEUTROABS 3.3  --  5.9  --   --   --   --   HGB 14.5   < > 13.0   < > 14.3 13.6 13.1  HCT 47.5   < > 39.7   < > 43.7 41.9 39.9  MCV 93.5   < > 87.8   < > 86.2 87.5 87.3  PLT 210   < > 176   < > 207 199 420*   < > = values in this interval not displayed.   Lab Results  Component Value Date   TSH 2.82 08/31/2015   Lab Results  Component Value Date   HGBA1C 6.2 (H) 03/23/2021   Lab Results  Component Value Date   CHOL 262 (H) 08/31/2015   HDL 131 08/31/2015   LDLCALC 123 08/31/2015   TRIG 43 03/24/2021   CHOLHDL 2.0 08/31/2015    Significant Diagnostic Results in last 30 days:  DG Chest 1 View  Result Date: 03/27/2021 CLINICAL DATA:  Shortness of breath EXAM: PORTABLE CHEST 1 VIEW COMPARISON:  03/24/2021 FINDINGS: Gastric catheter and endotracheal tube have been removed in the interval. Lungs are well aerated bilaterally. Mild central vascular prominence is seen without focal infiltrate or edema. No acute bony abnormality is noted. IMPRESSION: Mild vascular congestion without focal infiltrate. Electronically Signed   By: Inez Catalina M.D.   On: 03/27/2021  01:55   DG Abd 1 View  Result Date: 03/24/2021 CLINICAL DATA:  NG placement.  Seizure. EXAM: ABDOMEN - 1 VIEW; PORTABLE CHEST - 1 VIEW COMPARISON:  Chest radiograph dated 03/23/2021. FINDINGS: Endotracheal tube with tip approximately 5 cm above the carina. Enteric tube with tip in the distal stomach. Probable small left pleural effusion. There is left lung base atelectasis or infiltrate. The right lung is clear. No pneumothorax. Top-normal cardiac size. No acute osseous pathology. IMPRESSION: 1. Endotracheal tube above the carina. Enteric tube with tip in the distal stomach. 2. Left lung base atelectasis or infiltrate. Electronically Signed   By: Anner Crete M.D.   On: 03/24/2021 01:54   MR BRAIN W WO CONTRAST  Result Date: 03/24/2021 CLINICAL DATA:  Seizure, new onset, no history of trauma EXAM: MRI HEAD WITHOUT AND WITH CONTRAST TECHNIQUE: Multiplanar, multiecho pulse sequences of the brain and surrounding structures were obtained without and with intravenous contrast. CONTRAST:  83mL GADAVIST GADOBUTROL 1 MMOL/ML IV SOLN COMPARISON:  No prior MRI, correlation is made with CT head and CTA head neck 03/23/2021 FINDINGS: Brain: No restricted diffusion to suggest acute or subacute infarction. No acute hemorrhage, intraparenchymal mass, mass effect, or midline shift. No abnormal parenchymal enhancement. Redemonstrated extra-axial, dural-based mass along the right parietal convexity, measuring up to 1.6 x 2.2 x 0.9 cm (  AP x TR x CC) (series 25, image 10 and series 24, image 8). No signal abnormality in the underlying right parietal lobe to suggest significant mass effect. No hydrocephalus or extra-axial collection. Scattered T2 hyperintense signal in the periventricular white matter, likely the sequela of mild chronic small vessel ischemic disease. Lacunar infarcts in the left basal ganglia. General cerebral volume loss, which is somewhat advanced for age. No signal abnormality in the bilateral  hippocampi, which demonstrate atrophy, right greater than left, with widening of the temporal horns. No heterotopia or evidence of cortical dysgenesis. Vascular: Normal flow voids. Please see CTA head neck for more detailed vascular findings. Skull and upper cervical spine: Normal marrow signal. Sinuses/Orbits: Mucosal thickening in the ethmoid air cells. The orbits are unremarkable. Other: Trace fluid in the right mastoid air cells. IMPRESSION: 1. No infarct or other acute intracranial process. 2. No seizure etiology identified. There is atrophy in the right greater than left hippocampus, with dilatation of the right greater than left temporal horn. 3. Dural based mass along the right parietal convexity, without signal abnormality in the underlying right parietal lobe, consistent with a meningioma. Electronically Signed   By: Merilyn Baba M.D.   On: 03/24/2021 01:26   DG CHEST PORT 1 VIEW  Result Date: 03/31/2021 CLINICAL DATA:  COVID-19 EXAM: PORTABLE CHEST 1 VIEW COMPARISON:  03/27/2021 FINDINGS: Heart is upper limits normal in size. Lungs clear. No effusions. No acute bony abnormality. IMPRESSION: No active disease. Electronically Signed   By: Rolm Baptise M.D.   On: 03/31/2021 12:03   DG Chest Port 1 View  Result Date: 03/24/2021 CLINICAL DATA:  NG placement.  Seizure. EXAM: ABDOMEN - 1 VIEW; PORTABLE CHEST - 1 VIEW COMPARISON:  Chest radiograph dated 03/23/2021. FINDINGS: Endotracheal tube with tip approximately 5 cm above the carina. Enteric tube with tip in the distal stomach. Probable small left pleural effusion. There is left lung base atelectasis or infiltrate. The right lung is clear. No pneumothorax. Top-normal cardiac size. No acute osseous pathology. IMPRESSION: 1. Endotracheal tube above the carina. Enteric tube with tip in the distal stomach. 2. Left lung base atelectasis or infiltrate. Electronically Signed   By: Anner Crete M.D.   On: 03/24/2021 01:54   DG Chest Port 1  View  Result Date: 03/23/2021 CLINICAL DATA:  Intubation EXAM: PORTABLE CHEST 1 VIEW COMPARISON:  10/07/2020 FINDINGS: Endotracheal tube 5 cm above the carina. Normal heart size and vascularity. Bibasilar atelectasis worse on the left. Negative for edema, effusion or pneumothorax. Aorta is atherosclerotic. IMPRESSION: Endotracheal tube 5 cm above the carina. Lower lung volumes with bibasilar atelectasis, worse on the left. Electronically Signed   By: Jerilynn Mages.  Shick M.D.   On: 03/23/2021 12:22   EEG adult  Result Date: 03/23/2021 Lora Havens, MD     03/23/2021  2:24 PM Patient Name: Dennis Levine MRN: [id number] Epilepsy Attending: Lora Havens Referring Physician/Provider: Dr Amie [place] Date: 03/23/2021 Duration: 24.29 mins Patient history:  71 year old being brought in for evaluation for leftward gaze, seizures followed by left-sided weakness. EEG to evaluate for seizure Level of alertness: Asleep AEDs during EEG study: LEV, ativan, propofol Technical aspects: This EEG study was done with scalp electrodes positioned according to the 10-20 International system of electrode placement. Electrical activity was acquired at a sampling rate of 500Hz  and reviewed with a high frequency filter of 70Hz  and a low frequency filter of 1Hz . EEG data were recorded continuously and digitally stored. Description: EEG showed continuous  generalized low amplitude 3-6 theta-delta slowing admixed with 15 to 18 Hz generalized beta activity predominantly in bilateral frontocentral regions. Frequent spikes were noted in right frontal region. Hyperventilation and photic stimulation were not performed.   ABNORMALITY -Spike, right frontal region -Continuous slow, generalized IMPRESSION: This study showed evidence of epileptogenicity arising from right frontal region. Additionally there is severe diffuse encephalopathy, nonspecific etiology but most likely due to sedation. No seizures were seen throughout the recording. Dr Rory Percy  was notified. Priyanka [name]   Overnight EEG with video  Result Date: 03/24/2021 Lora Havens, MD     03/25/2021  9:33 AM Patient Name: Dennis Levine MRN: [id number] Epilepsy Attending: Lora Havens Referring Physician/Provider: Dr Amie [place] Duration: 03/23/2021 1325 to [place] 03/24/2021 0810 to 03/25/2021 0300  Patient history:  71 year old being brought in for evaluation for leftward gaze, seizures followed by left-sided weakness. EEG to evaluate for seizure  Level of alertness: lethargic  AEDs during EEG study: LEV, ativan, propofol  Technical aspects: This EEG study was done with scalp electrodes positioned according to the 10-20 International system of electrode placement. Electrical activity was acquired at a sampling rate of 500Hz  and reviewed with a high frequency filter of 70Hz  and a low frequency filter of 1Hz . EEG data were recorded continuously and digitally stored.  Description: EEG showed continuous generalized low amplitude 3-6 theta-delta slowing admixed with 15 to 18 Hz generalized beta activity predominantly in bilateral frontocentral regions. Frequent spikes were noted in right frontal region. Hyperventilation and photic stimulation were not performed.   Of note, study was technically difficult due to significant myogenic artifact.  Additionally, machine was unplugged at [facility] on 04/09/2024 during patient transferred and not replugged until 0810 on 03/24/2021.  ABNORMALITY -Spike, right frontal region -Continuous slow, generalized  IMPRESSION: This study showed evidence of epileptogenicity arising from right frontal region. Additionally there is severe diffuse encephalopathy, nonspecific etiology but most likely due to sedation. No seizures were seen throughout the recording.  Lora Havens   ECHOCARDIOGRAM COMPLETE  Result Date: 03/24/2021    ECHOCARDIOGRAM REPORT   Patient Name:   VELDON WAGER Date of Exam: 03/24/2021 Medical Rec #:  [id number]       Height:       70.0  in Accession #:    [id number]      Weight:       148.4 lb Date of Birth:  October 16, 1950       BSA:          1.839 m Patient Age:    22 years        BP:           125/76 mmHg Patient Gender: M               HR:           79 bpm. Exam Location:  Inpatient Procedure: 2D Echo, Cardiac Doppler and Color Doppler Indications:    Dyspnea R06.00  History:        Patient has no prior history of Echocardiogram examinations.                 Risk Factors:Hypertension.  Sonographer:    Darlina Sicilian RDCS Referring Phys: [place]  1. Left ventricular ejection fraction, by estimation, is 65 to 70%. The left ventricle has normal function. The left ventricle has no regional wall motion abnormalities. Left ventricular diastolic parameters are consistent with Grade I diastolic dysfunction (impaired relaxation).  2. Right ventricular systolic function is normal. The right ventricular size is normal. There is normal pulmonary artery systolic pressure.  3. The mitral valve is grossly normal. No evidence of mitral valve regurgitation. No evidence of mitral stenosis.  4. The aortic valve was not well visualized. Aortic valve regurgitation is not visualized. No aortic stenosis is present.  5. The inferior vena cava is dilated in size with >50% respiratory variability, suggesting right atrial pressure of 8 mmHg. Comparison(s): No prior Echocardiogram. FINDINGS  Left Ventricle: Left ventricular ejection fraction, by estimation, is 65 to 70%. The left ventricle has normal function. The left ventricle has no regional wall motion abnormalities. The left ventricular internal cavity size was normal in size. There is  no left ventricular hypertrophy. Left ventricular diastolic parameters are consistent with Grade I diastolic dysfunction (impaired relaxation). Right Ventricle: The right ventricular size is normal. No increase in right ventricular wall thickness. Right ventricular systolic function is normal. There is normal  pulmonary artery systolic pressure. The tricuspid regurgitant velocity is 2.26 m/s, and  with an assumed right atrial pressure of 8 mmHg, the estimated right ventricular systolic pressure is 16.1 mmHg. Left Atrium: Left atrial size was normal in size. Right Atrium: Right atrial size was normal in size. Pericardium: There is no evidence of pericardial effusion. Mitral Valve: The mitral valve is grossly normal. No evidence of mitral valve regurgitation. No evidence of mitral valve stenosis. Tricuspid Valve: The tricuspid valve is normal in structure. Tricuspid valve regurgitation is mild . No evidence of tricuspid stenosis. Aortic Valve: The aortic valve was not well visualized. Aortic valve regurgitation is not visualized. No aortic stenosis is present. Pulmonic Valve: The pulmonic valve was not well visualized. Pulmonic valve regurgitation is trivial. No evidence of pulmonic stenosis. Aorta: The aortic root and ascending aorta are structurally normal, with no evidence of dilitation. Venous: The inferior vena cava is dilated in size with greater than 50% respiratory variability, suggesting right atrial pressure of 8 mmHg. IAS/Shunts: The atrial septum is grossly normal.  LEFT VENTRICLE PLAX 2D LVIDd:         4.30 cm   Diastology LVIDs:         2.30 cm   LV e' medial:    4.86 cm/s LV PW:         1.00 cm   LV E/e' medial:  11.3 LV IVS:        1.00 cm   LV e' lateral:   6.04 cm/s LVOT diam:     1.90 cm   LV E/e' lateral: 9.1 LV SV:         45 LV SV Index:   25 LVOT Area:     2.84 cm  RIGHT VENTRICLE RV S prime:     11.30 cm/s TAPSE (M-mode): 2.3 cm LEFT ATRIUM           Index        RIGHT ATRIUM          Index LA diam:      1.90 cm 1.03 cm/m   RA Area:     8.96 cm LA Vol (A2C): 12.3 ml 6.69 ml/m   RA Volume:   15.00 ml 8.16 ml/m LA Vol (A4C): 19.7 ml 10.72 ml/m  AORTIC VALVE LVOT Vmax:   71.60 cm/s LVOT Vmean:  50.600 cm/s LVOT VTI:    0.159 m  AORTA Ao Root diam: 3.10 cm MITRAL VALVE  TRICUSPID VALVE  MV Area (PHT): 3.60 cm    TR Peak grad:   20.4 mmHg MV Decel Time: 211 msec    TR Vmax:        226.00 cm/s MV E velocity: 54.85 cm/s MV A velocity: 70.05 cm/s  SHUNTS MV E/A ratio:  0.78        Systemic VTI:  0.16 m                            Systemic Diam: 1.90 cm Rudean Haskell MD Electronically signed by Rudean Haskell MD Signature Date/Time: 03/24/2021/3:14:44 PM    Final    CT HEAD CODE STROKE WO CONTRAST  Result Date: 03/23/2021 CLINICAL DATA:  Code stroke.  Old deficit, acute, stroke suspected EXAM: CT HEAD WITHOUT CONTRAST TECHNIQUE: Contiguous axial images were obtained from the base of the skull through the vertex without intravenous contrast. COMPARISON:  10/07/2020 FINDINGS: Brain: Generalized atrophy. No sign of acute infarction, mass lesion, hemorrhage, hydrocephalus or extra-axial collection. Mild chronic small-vessel ischemic change of the white matter. Old lacunar infarction left caudate. Vascular: There is atherosclerotic calcification of the major vessels at the base of the brain. Skull: Negative Sinuses/Orbits: Clear/normal Other: Right forehead scalp lipoma. ASPECTS [facility] Stroke Program Early CT Score) - Ganglionic level infarction (caudate, lentiform nuclei, internal capsule, insula, M1-M3 cortex): 7 - Supraganglionic infarction (M4-M6 cortex): 3 Total score (0-10 with 10 being normal): 10 IMPRESSION: 1. No acute CT finding. Atrophy. Chronic small-vessel ischemic changes of the white matter. Old left caudate lacunar infarction. 2. ASPECTS is 10 3. These results were communicated to Dr. Rory Percy at 11:19 am on 03/23/2021 by text page via the [facility] messaging system. Electronically Signed   By: Nelson Chimes M.D.   On: 03/23/2021 11:20   CT ANGIO HEAD NECK W WO CM (CODE STROKE)  Result Date: 03/23/2021 CLINICAL DATA:  Stroke. Follow-up. Neurological deficit, acute, stroke suspected. EXAM: CT ANGIOGRAPHY HEAD AND NECK TECHNIQUE: Multidetector CT imaging of the head and neck  was performed using the standard protocol during bolus administration of intravenous contrast. Multiplanar CT image reconstructions and MIPs were obtained to evaluate the vascular anatomy. Carotid stenosis measurements (when applicable) are obtained utilizing NASCET criteria, using the distal internal carotid diameter as the denominator. CONTRAST:  45mL OMNIPAQUE IOHEXOL 350 MG/ML SOLN COMPARISON:  Head CT earlier same day FINDINGS: CTA NECK FINDINGS Aortic arch: Aortic arch is normal. Branching pattern is normal without origin stenosis. Right carotid system: Common carotid artery is widely patent to the bifurcation. There is soft and calcified plaque at the carotid bifurcation and ICA bulb. There is no stenosis. Cervical ICA is patent to the skull base. Left carotid system: Common carotid artery is patent to the bifurcation with scattered soft plaque but no stenosis. Carotid bifurcation and ICA bulb show calcified more than soft plaque. No stenosis. Cervical ICA is patent beyond that. Vertebral arteries: Calcified plaque at both vertebral artery origins. The right vertebral artery is dominant. No stenosis at the right vertebral artery origin. Severe stenosis of the non dominant left vertebral artery origin. Both vessels patent beyond that through the cervical region to the foramen magnum. Skeleton: Scoliotic curvature convex to the right. Mid cervical spondylosis. Other neck: No mass or lymphadenopathy. Nasal intubation with the tip in the oropharynx. Upper chest: Lung apices are clear. Review of the MIP images confirms the above findings CTA HEAD FINDINGS Anterior circulation: Both internal carotid arteries are widely patent through  the skull base. There is atherosclerotic calcification throughout both carotid siphon regions with stenosis estimated at 50% on both sides. The anterior and middle cerebral vessels are patent on both sides. There is moderate stenosis of the left middle cerebral artery inferior division  M2 branch. Distal branch vessels do show some atherosclerotic irregularity. No evidence of large vessel occlusion or correctable proximal stenosis. Posterior circulation: Both vertebral arteries are patent through the foramen magnum. The left terminates in PICA. Dominant right vertebral artery supplies the basilar. Superior cerebellar and posterior cerebral arteries show flow. There is atherosclerotic irregularity in the more distal PCA branches. Venous sinuses: Patent and normal. Anatomic variants: None significant. 15 mm enhancing meningioma in the right posterior parietal region without significant mass-effect upon the brain. Review of the MIP images confirms the above findings IMPRESSION: No large vessel occlusion. Atherosclerotic change at both carotid bifurcations without stenosis. Severe stenosis of the non dominant left vertebral artery origin. Left vertebral artery terminates in PICA. Right vertebral artery widely patent. Atherosclerotic disease in both carotid siphon regions with stenosis estimated at 50% on both sides. Moderate stenosis of the left middle cerebral artery inferior division M2 branch. Atherosclerotic narrowing and irregularity within the more distal branch vessels of the anterior, middle and posterior cerebral artery territories. 15 mm right parietal meningioma without significant mass-effect upon the brain. Electronically Signed   By: Nelson Chimes M.D.   On: 03/23/2021 11:40    Assessment/Plan  1. Seizures [facility]) -   Neurology was consulted and was started on Keppra -   Continue Keppra 500 mg twice a day -    Follow-up with neurology  2. Meningioma (HCC) -   There was no signal change in the brain parenchyma adjacent to the meningioma making it less likely the cause for his seizure -    Follow-up with neurosurgery  3. Essential hypertension -Continue Norvasc 10 mg 1 tab daily and metoprolol tartrate 25 mg 1 tab twice a day  4. Alcohol abuse -   Drinks 6 packs of beer  daily -   Counseled for cessation -    Monitor for alcohol withdrawal -    Continue thiamine 100 mg 1 tab daily and folic acid 1 mg 1 tab daily  5. Chronic midline low back pain with left-sided sciatica -   Continue acetaminophen 325 mg give 2 tabs = 650 mg every 6 hours as needed  6.  COVID-19 -    Incidental finding during screening tests done in preparation for SNF discharge -   Had 1 COVID-19 vaccination -   Completed 10-day isolation  7.  Physical deconditioning -   for PT and OT, for therapeutic strengthening exercises    Family/ staff Communication: Discussed plan of care with resident and charge nurse  Labs/tests ordered: For CBC and BMP     Durenda Age, DNP, MSN, FNP-BC [facility] and [facility] [phone] (Monday-Friday 8:00 a.m. - 5:00 p.m.) [phone] (after hours)

## 2021-04-16 ENCOUNTER — Non-Acute Institutional Stay (SKILLED_NURSING_FACILITY): Payer: Medicare Other | Admitting: Internal Medicine

## 2021-04-16 ENCOUNTER — Encounter: Payer: Self-pay | Admitting: Internal Medicine

## 2021-04-16 DIAGNOSIS — D329 Benign neoplasm of meninges, unspecified: Secondary | ICD-10-CM | POA: Insufficient documentation

## 2021-04-16 DIAGNOSIS — E871 Hypo-osmolality and hyponatremia: Secondary | ICD-10-CM | POA: Insufficient documentation

## 2021-04-16 DIAGNOSIS — R7303 Prediabetes: Secondary | ICD-10-CM | POA: Insufficient documentation

## 2021-04-16 DIAGNOSIS — E43 Unspecified severe protein-calorie malnutrition: Secondary | ICD-10-CM | POA: Diagnosis not present

## 2021-04-16 DIAGNOSIS — F1010 Alcohol abuse, uncomplicated: Secondary | ICD-10-CM | POA: Diagnosis not present

## 2021-04-16 DIAGNOSIS — R569 Unspecified convulsions: Secondary | ICD-10-CM

## 2021-04-16 DIAGNOSIS — N182 Chronic kidney disease, stage 2 (mild): Secondary | ICD-10-CM

## 2021-04-16 DIAGNOSIS — U071 COVID-19: Secondary | ICD-10-CM | POA: Insufficient documentation

## 2021-04-16 NOTE — Progress Notes (Signed)
NURSING HOME LOCATION:  [facility] ROOM NUMBER:  [id number]  CODE STATUS:  Full Code  PCP:  none (verified)  This is a comprehensive admission note to this SNFperformed on this date less than 30 days from date of admission. Included are preadmission medical/surgical history; reconciled medication list; family history; social history and comprehensive review of systems.  Corrections and additions to the records were documented. Comprehensive physical exam was also performed. Additionally a clinical summary was entered for each active diagnosis pertinent to this admission in the Problem List to enhance continuity of care.  HPI: Patient was hospitalized over 3 weeks from 03/23/2021 - 04/14/2021 brought to the ED via EMS after being found down.  In transit seizure activity was documented.  Left gaze deficit and left-sided weakness documented.  Code stroke was called.  In the ED he was hypoxic and tachypneic.  He was intubated in the ED for airway protection and admitted to the Critical Care service.  He was able to be extubated 12/14.  Follow-up chest x-ray on 12/17 revealed mild vascular congestion with no focal infiltrates.  Clinically he was felt to have chronic diastolic congestive heart failure.  EF revealed 65-70% ejection fraction with grade 1 diastolic dysfunction. MRI did not reveal any acute infarct or intracranial process.  A dural based mass along the right parietal convexity consistent with a meningioma was present.  There was no signal change in the brain parenchyma adjacent to the meningioma making it less likely the cause for his seizures.  Neurology initiated [place].   There is a history of a consumption of at least 6 beers daily on a routine basis.  Neurology thought that alcohol withdrawal might be the etiology of the seizures.  Precedex was initiated in the ICU.  Zyprexa was stopped because of persistent somnolence.  Hospital course was remarkable for persistent  confusion and agitation felt to be alcohol withdrawal delirium. Mental status gradually improved , returning to baseline. COVID positivity was listed as a "incidental finding".  COVID screening was done as a requirement prior to discharge to SNF.  No treatment was initiated today as he had completed 10 days of isolation. He is unvaccinated. Because of debility and deconditioning PT/OT recommended skilled nursing facility placement for PT OT rehab.  Past medical and surgical history: Includes history of prostate cancer, history of hepatitis C,  & essential hypertension.Record states he was on Amlodipine @ home; but he denied this on 1/7.   Surgeries and procedure include surgery for finger contraction, robot-assisted laparoscopic radical prostatectomy and pelvic lymph node dissection in 2017.  Social history: Consumption of 6 beers per day PTA as per medical record.  Today he tells me he was drinking 3-4 beers per day "when I have the money".  Non-smoker for an extended period of time.  He indicates that he works in a [employer]. He moved from [place] & has no PCP.  Family history: Limited history reviewed. Today  he denied FH CAD, HTN, CVA, DM or cancer.   Review of systems: He gave the date as Friday, January, 5th(not 6th),2023". He states that he "fell out" at work on Monday morning.  He denies any neurologic or cardiologic prodrome prior to the fall.  He denied any COVID symptoms at that time.  He is unvaccinated. He did not realize he was hospitalized for a seizure but did not realize a meningioma was documented or that the seizure was attributed to alcohol withdrawal. He continues to  deny any active symptoms.  Constitutional: No fever, significant weight change, fatigue  Eyes: No redness, discharge, pain, vision change ENT/mouth: No nasal congestion, purulent discharge, earache, change in hearing, sore throat  Cardiovascular: No chest pain, palpitations, paroxysmal nocturnal  dyspnea, claudication, edema  Respiratory: No cough, sputum production, hemoptysis, DOE, significant snoring, apnea Gastrointestinal: No heartburn, dysphagia, abdominal pain, nausea /vomiting, rectal bleeding, melena, change in bowels Genitourinary: No dysuria, hematuria, pyuria, incontinence, nocturia Musculoskeletal: No joint stiffness, joint swelling, weakness, pain Dermatologic: No rash, pruritus Neurologic: No dizziness, headache, syncope, numbness, tingling Psychiatric: No significant anxiety, depression, insomnia, anorexia Endocrine: No change in hair/skin/nails, excessive thirst, excessive hunger, excessive urination  Hematologic/lymphatic: No significant bruising, lymphadenopathy, abnormal bleeding Allergy/immunology: No itchy/watery eyes, significant sneezing, urticaria, angioedema  Physical exam:  Pertinent or positive findings: Pattern alopecia is present.  He has a beard and mustache.  He has an intermittent wide-eyed stare.  There is minimal lid lag with extraocular motion testing.  He has dense arcus senilis.  He has only 1 remaining mandibular tooth; there are carious nubbins to the gumline and below.  First heart sound is accentuated.  Pedal pulses are decreased.  There are variable hypopigmented as well as hyperpigmented fingernail changes.  The right fourth digit DIP has some lateral deviation.  He has decreased flexion of the third through the fifth fingers of the right hand.  Upper extremities are stronger to opposition than lower extremities.  He has exfoliative changes of the hands.  General appearance: in no distress ; no increased WOB Lymphatic: No lymphadenopathy about the head, neck, axilla. Eyes: No conjunctival inflammation or lid edema is present. There is no scleral icterus. Ears:  External ear exam shows no significant lesions or deformities.   Nose:  External nasal examination shows no deformity or inflammation. Nasal mucosa are pink and moist without lesions,  exudates Oral exam: There is no oropharyngeal erythema or exudate. Neck:  No thyromegaly, masses, tenderness noted.    Heart:  Normal rate and regular rhythm. S2 normal without gallop, murmur, click, rub.  Lungs:  without wheezes, rhonchi, rales, rubs. Abdomen: Bowel sounds are normal.  Abdomen is soft and nontender with no organomegaly, hernias, masses. GU: Deferred  Extremities:  No cyanosis, clubbing, edema. Neurologic exam: Balance, Rhomberg, finger to nose testing could not be completed due to clinical state Skin: Warm & dry w/o tenting. No significant lesions or rash.  See clinical summary under each active problem in the Problem List with associated updated therapeutic plan

## 2021-04-16 NOTE — Assessment & Plan Note (Addendum)
Annual monitor of A1c should suffice.

## 2021-04-16 NOTE — Assessment & Plan Note (Signed)
Neurosurgery follow-up as outpatient.

## 2021-04-16 NOTE — Assessment & Plan Note (Addendum)
Albumin 2.7, total protein 6. Nutrition consult at SNF with Ensure Plus protein supplementation.

## 2021-04-16 NOTE — Patient Instructions (Signed)
See assessment and plan under each diagnosis in the problem list and acutely for this visit 

## 2021-04-16 NOTE — Assessment & Plan Note (Signed)
During the 03/23/2021 - 04/14/2021 admission for seizures renal function remained stable with final creatinine of 0.63 and GFR greater than 60.  Med list reviewed; no nephrotoxic risk identified.

## 2021-04-16 NOTE — Assessment & Plan Note (Signed)
No seizures reported at the SNF

## 2021-04-16 NOTE — Assessment & Plan Note (Addendum)
Hyponatremia should be monitored; this is most unlikely to have contributed to his seizures as sodium was normal at presentation.

## 2021-04-16 NOTE — Assessment & Plan Note (Addendum)
On maintenance folate & thiamine. He states he drinks < 6 beers/day & only when he has money.

## 2021-04-17 ENCOUNTER — Encounter: Payer: Self-pay | Admitting: Internal Medicine

## 2021-04-17 DIAGNOSIS — I10 Essential (primary) hypertension: Secondary | ICD-10-CM | POA: Insufficient documentation

## 2021-04-17 NOTE — Assessment & Plan Note (Signed)
He is unvaccinated. He denies COVID symptoms PTA or now.

## 2021-04-21 ENCOUNTER — Non-Acute Institutional Stay (SKILLED_NURSING_FACILITY): Payer: Medicare Other | Admitting: Adult Health

## 2021-04-21 ENCOUNTER — Encounter: Payer: Self-pay | Admitting: Adult Health

## 2021-04-21 DIAGNOSIS — I10 Essential (primary) hypertension: Secondary | ICD-10-CM

## 2021-04-21 DIAGNOSIS — R569 Unspecified convulsions: Secondary | ICD-10-CM

## 2021-04-21 DIAGNOSIS — U071 COVID-19: Secondary | ICD-10-CM

## 2021-04-21 DIAGNOSIS — F1010 Alcohol abuse, uncomplicated: Secondary | ICD-10-CM

## 2021-04-21 DIAGNOSIS — D329 Benign neoplasm of meninges, unspecified: Secondary | ICD-10-CM | POA: Diagnosis not present

## 2021-04-21 DIAGNOSIS — R5381 Other malaise: Secondary | ICD-10-CM

## 2021-04-21 NOTE — Progress Notes (Signed)
Location:  [place] Room Number: 993-T Place of Service:  SNF (31) Provider:  Durenda Age, DNP, FNP-BC  Patient Care Team: Pcp, No as PCP - General  Extended Emergency Contact Information Primary Emergency Contact: [place] [facility] Phone: [phone] Relation: Son Secondary Emergency Contact: [facility] Phone: [phone] Relation: Niece  Code Status:  Full Code   Goals of care: Advanced Directive information Advanced Directives 04/21/2021  Does Patient Have a Medical Advance Directive? No  Would patient like information on creating a medical advance directive? No - Patient declined     Chief Complaint  Patient presents with   Discharge Note    Discharge from [facility]     HPI:  Pt is a 71 y.o. male who is for discharge home on 04/23/21 with Home health PT, OT and Nursing.  He was admitted to [facility] on 04/14/21 post hospital admission 03/23/2021 to 04/14/21.  He has a PMH of prostate cancer, hepatitis C, hypertension and alcohol use.  He was found unresponsive in the bathroom, when EMS arrived he was noted to be seizing.  He was hypoxic and tachypneic upon arrival to the ED.  He was intubated in the ED on 12/13 for concern of ongoing seizure and airway protection.  MRI of the brain did not show any acute infarct or intracranial process but showed dural based mass along the right bilateral convexity consistent with meningioma.  EEG showed epileptogenicity from the right frontal region but no active seizures.  Neurology was consulted and was started on Keppra.  Neurology thought that alcohol could be the etiology for the seizure.  He was extubated on 12/14.  Hospitalization was remarkable for persistent confusion/agitation most likely secondary to alcohol withdrawal.  Patient was admitted to this facility for short-term rehabilitation after the patient's recent hospitalization.  Patient has  completed SNF rehabilitation and therapy has cleared the patient for discharge.   Past Medical History:  Diagnosis Date   Cancer [facility])    prostate   Hepatitis    hepatitis c    Hypertension    Past Surgical History:  Procedure Laterality Date   4th index finger straightened  1990's   finger surgery for injury  1970's right hand   PELVIC LYMPH NODE DISSECTION N/A 02/03/2016   Procedure: PELVIC LYMPH NODE DISSECTION;  Surgeon: Ardis Hughs, MD;  Location: [facility] ORS;  Service: Urology;  Laterality: N/A;   ROBOT ASSISTED LAPAROSCOPIC RADICAL PROSTATECTOMY N/A 02/03/2016   Procedure: XI ROBOTIC ASSISTED LAPAROSCOPIC RADICAL PROSTATECTOMY;  Surgeon: Ardis Hughs, MD;  Location: [facility] ORS;  Service: Urology;  Laterality: N/A;    No Known Allergies  Outpatient Encounter Medications as of 04/21/2021  Medication Sig   acetaminophen (TYLENOL) 325 MG tablet Take 650 mg by mouth every 6 (six) hours as needed for mild pain or fever.   amLODipine (NORVASC) 10 MG tablet Take 1 tablet (10 mg total) by mouth daily.   feeding supplement (ENSURE ENLIVE / ENSURE PLUS) LIQD Take 237 mLs by mouth 3 (three) times daily between meals.   folic acid (FOLVITE) 1 MG tablet Take 1 tablet (1 mg total) by mouth daily.   levETIRAcetam (KEPPRA) 500 MG tablet Take 1 tablet (500 mg total) by mouth 2 (two) times daily.   metoprolol tartrate (LOPRESSOR) 25 MG tablet Take 1 tablet (25 mg total) by mouth 2 (two) times daily.   Multiple Vitamin (MULTIVITAMIN WITH MINERALS) TABS tablet Take 1 tablet by mouth daily.  polyethylene glycol (MIRALAX / GLYCOLAX) 17 g packet Take 17 g by mouth daily as needed for moderate constipation.   thiamine 100 MG tablet Take 1 tablet (100 mg total) by mouth daily.   [DISCONTINUED] acetaminophen (TYLENOL) 325 MG tablet Take 1 tablet (325 mg total) by mouth every 6 (six) hours as needed for mild pain (or Fever >/= 101).   No facility-administered encounter medications on file as of  04/21/2021.    Review of Systems  Constitutional:  Negative for activity change, appetite change and fever.  HENT:  Negative for sore throat.   Eyes: Negative.   Cardiovascular:  Negative for chest pain and leg swelling.  Gastrointestinal:  Negative for abdominal distention, diarrhea and vomiting.  Genitourinary:  Negative for dysuria, frequency and urgency.  Skin:  Negative for color change.  Neurological:  Negative for dizziness and headaches.  Psychiatric/Behavioral:  Negative for behavioral problems and sleep disturbance. The patient is not nervous/anxious.       Immunization History  Administered Date(s) Administered   [employer] Vaccination 06/03/2019   Pneumococcal Polysaccharide-23 08/31/2015   Tdap 08/31/2015   Pertinent  Health Maintenance Due  Topic Date Due   COLONOSCOPY (Pts 45-83yrs Insurance coverage will need to be confirmed)  Never done   INFLUENZA VACCINE  Never done   Fall Risk 04/12/2021 04/12/2021 04/13/2021 04/13/2021 04/14/2021  Falls in the past year? - - - - -  Patient Fall Risk Level High fall risk High fall risk High fall risk High fall risk High fall risk     Vitals:   04/21/21 1052  BP: 116/65  Pulse: 69  Resp: 18  Temp: 97.7 F (36.5 C)  SpO2: 96%  Weight: 132 lb 4.8 oz (60 kg)  Height: 5\' 10"  (1.778 m)   Body mass index is 18.98 kg/m.  Physical Exam HENT:     Head: Normocephalic and atraumatic.     Mouth/Throat:     Mouth: Mucous membranes are moist.  Eyes:     Conjunctiva/sclera: Conjunctivae normal.  Cardiovascular:     Rate and Rhythm: Normal rate and regular rhythm.     Pulses: Normal pulses.     Heart sounds: Normal heart sounds.  Pulmonary:     Effort: Pulmonary effort is normal.     Breath sounds: Normal breath sounds.  Abdominal:     General: Bowel sounds are normal.     Palpations: Abdomen is soft.  Musculoskeletal:        General: No swelling. Normal range of motion.     Cervical back: Normal range of motion.   Skin:    General: Skin is warm and dry.  Neurological:     General: No focal deficit present.     Mental Status: He is alert and oriented to person, place, and time.  Psychiatric:        Mood and Affect: Mood normal.        Behavior: Behavior normal.        Thought Content: Thought content normal.       Labs reviewed: Recent Labs    03/23/21 1340 03/24/21 0455 03/24/21 0621 03/25/21 0232 03/26/21 0150 03/28/21 0703 03/29/21 0347 04/08/21 0225  NA  --    < > 134* 136   < > 134* 133* 132*  K  --    < > 3.7 3.7   < > 4.1 4.0 4.6  CL  --   --  103 106   < > 101 102 101  CO2  --   --  25 23   < > 24 24 24   GLUCOSE  --   --  108* 105*   < > 108* 89 121*  BUN  --   --  9 9   < > 13 18 21   CREATININE  --   --  0.86 0.73   < > 0.79 0.79 0.63  CALCIUM  --   --  8.2* 8.7*   < > 9.0 8.4* 9.2  MG 1.8  --  1.7 1.7  --   --   --   --   PHOS 3.3  --  3.7  --   --   --   --   --    < > = values in this interval not displayed.   Recent Labs    10/07/20 0943 03/23/21 1107 03/25/21 0232  AST 66* 48* 65*  ALT 45* 32 36  ALKPHOS 58 69 54  BILITOT 1.6* 0.6 1.3*  PROT 7.7 7.0 6.0*  ALBUMIN 3.9 3.5 2.7*   Recent Labs    03/23/21 1107 03/23/21 1110 03/25/21 0232 03/26/21 0150 03/28/21 0703 03/29/21 0347 04/08/21 0225  WBC 7.0   < > 8.2   < > 5.8 5.3 5.4  NEUTROABS 3.3  --  5.9  --   --   --   --   HGB 14.5   < > 13.0   < > 14.3 13.6 13.1  HCT 47.5   < > 39.7   < > 43.7 41.9 39.9  MCV 93.5   < > 87.8   < > 86.2 87.5 87.3  PLT 210   < > 176   < > 207 199 420*   < > = values in this interval not displayed.   Lab Results  Component Value Date   TSH 2.82 08/31/2015   Lab Results  Component Value Date   HGBA1C 6.2 (H) 03/23/2021   Lab Results  Component Value Date   CHOL 262 (H) 08/31/2015   HDL 131 08/31/2015   LDLCALC 123 08/31/2015   TRIG 43 03/24/2021   CHOLHDL 2.0 08/31/2015    Significant Diagnostic Results in last 30 days:  DG Chest 1 View  Result Date:  03/27/2021 CLINICAL DATA:  Shortness of breath EXAM: PORTABLE CHEST 1 VIEW COMPARISON:  03/24/2021 FINDINGS: Gastric catheter and endotracheal tube have been removed in the interval. Lungs are well aerated bilaterally. Mild central vascular prominence is seen without focal infiltrate or edema. No acute bony abnormality is noted. IMPRESSION: Mild vascular congestion without focal infiltrate. Electronically Signed   By: Inez Catalina M.D.   On: 03/27/2021 01:55   DG Abd 1 View  Result Date: 03/24/2021 CLINICAL DATA:  NG placement.  Seizure. EXAM: ABDOMEN - 1 VIEW; PORTABLE CHEST - 1 VIEW COMPARISON:  Chest radiograph dated 03/23/2021. FINDINGS: Endotracheal tube with tip approximately 5 cm above the carina. Enteric tube with tip in the distal stomach. Probable small left pleural effusion. There is left lung base atelectasis or infiltrate. The right lung is clear. No pneumothorax. Top-normal cardiac size. No acute osseous pathology. IMPRESSION: 1. Endotracheal tube above the carina. Enteric tube with tip in the distal stomach. 2. Left lung base atelectasis or infiltrate. Electronically Signed   By: Anner Crete M.D.   On: 03/24/2021 01:54   MR BRAIN W WO CONTRAST  Result Date: 03/24/2021 CLINICAL DATA:  Seizure, new onset, no history of trauma EXAM: MRI HEAD WITHOUT AND WITH CONTRAST TECHNIQUE:  Multiplanar, multiecho pulse sequences of the brain and surrounding structures were obtained without and with intravenous contrast. CONTRAST:  28mL GADAVIST GADOBUTROL 1 MMOL/ML IV SOLN COMPARISON:  No prior MRI, correlation is made with CT head and CTA head neck 03/23/2021 FINDINGS: Brain: No restricted diffusion to suggest acute or subacute infarction. No acute hemorrhage, intraparenchymal mass, mass effect, or midline shift. No abnormal parenchymal enhancement. Redemonstrated extra-axial, dural-based mass along the right parietal convexity, measuring up to 1.6 x 2.2 x 0.9 cm (AP x TR x CC) (series 25, image 10  and series 24, image 8). No signal abnormality in the underlying right parietal lobe to suggest significant mass effect. No hydrocephalus or extra-axial collection. Scattered T2 hyperintense signal in the periventricular white matter, likely the sequela of mild chronic small vessel ischemic disease. Lacunar infarcts in the left basal ganglia. General cerebral volume loss, which is somewhat advanced for age. No signal abnormality in the bilateral hippocampi, which demonstrate atrophy, right greater than left, with widening of the temporal horns. No heterotopia or evidence of cortical dysgenesis. Vascular: Normal flow voids. Please see CTA head neck for more detailed vascular findings. Skull and upper cervical spine: Normal marrow signal. Sinuses/Orbits: Mucosal thickening in the ethmoid air cells. The orbits are unremarkable. Other: Trace fluid in the right mastoid air cells. IMPRESSION: 1. No infarct or other acute intracranial process. 2. No seizure etiology identified. There is atrophy in the right greater than left hippocampus, with dilatation of the right greater than left temporal horn. 3. Dural based mass along the right parietal convexity, without signal abnormality in the underlying right parietal lobe, consistent with a meningioma. Electronically Signed   By: Merilyn Baba M.D.   On: 03/24/2021 01:26   DG CHEST PORT 1 VIEW  Result Date: 03/31/2021 CLINICAL DATA:  COVID-19 EXAM: PORTABLE CHEST 1 VIEW COMPARISON:  03/27/2021 FINDINGS: Heart is upper limits normal in size. Lungs clear. No effusions. No acute bony abnormality. IMPRESSION: No active disease. Electronically Signed   By: Rolm Baptise M.D.   On: 03/31/2021 12:03   DG Chest Port 1 View  Result Date: 03/24/2021 CLINICAL DATA:  NG placement.  Seizure. EXAM: ABDOMEN - 1 VIEW; PORTABLE CHEST - 1 VIEW COMPARISON:  Chest radiograph dated 03/23/2021. FINDINGS: Endotracheal tube with tip approximately 5 cm above the carina. Enteric tube with tip  in the distal stomach. Probable small left pleural effusion. There is left lung base atelectasis or infiltrate. The right lung is clear. No pneumothorax. Top-normal cardiac size. No acute osseous pathology. IMPRESSION: 1. Endotracheal tube above the carina. Enteric tube with tip in the distal stomach. 2. Left lung base atelectasis or infiltrate. Electronically Signed   By: Anner Crete M.D.   On: 03/24/2021 01:54   DG Chest Port 1 View  Result Date: 03/23/2021 CLINICAL DATA:  Intubation EXAM: PORTABLE CHEST 1 VIEW COMPARISON:  10/07/2020 FINDINGS: Endotracheal tube 5 cm above the carina. Normal heart size and vascularity. Bibasilar atelectasis worse on the left. Negative for edema, effusion or pneumothorax. Aorta is atherosclerotic. IMPRESSION: Endotracheal tube 5 cm above the carina. Lower lung volumes with bibasilar atelectasis, worse on the left. Electronically Signed   By: Jerilynn Mages.  Shick M.D.   On: 03/23/2021 12:22   EEG adult  Result Date: 03/23/2021 Lora Havens, MD     03/23/2021  2:24 PM Patient Name: ISIDOR BROMELL MRN: [id number] Epilepsy Attending: Lora Havens Referring Physician/Provider: Dr Amie [place] Date: 03/23/2021 Duration: 24.29 mins Patient history:  71 year old being brought  in for evaluation for leftward gaze, seizures followed by left-sided weakness. EEG to evaluate for seizure Level of alertness: Asleep AEDs during EEG study: LEV, ativan, propofol Technical aspects: This EEG study was done with scalp electrodes positioned according to the 10-20 International system of electrode placement. Electrical activity was acquired at a sampling rate of 500Hz  and reviewed with a high frequency filter of 70Hz  and a low frequency filter of 1Hz . EEG data were recorded continuously and digitally stored. Description: EEG showed continuous generalized low amplitude 3-6 theta-delta slowing admixed with 15 to 18 Hz generalized beta activity predominantly in bilateral frontocentral regions.  Frequent spikes were noted in right frontal region. Hyperventilation and photic stimulation were not performed.   ABNORMALITY -Spike, right frontal region -Continuous slow, generalized IMPRESSION: This study showed evidence of epileptogenicity arising from right frontal region. Additionally there is severe diffuse encephalopathy, nonspecific etiology but most likely due to sedation. No seizures were seen throughout the recording. Dr Rory Percy was notified. Priyanka [name]   Overnight EEG with video  Result Date: 03/24/2021 Lora Havens, MD     03/25/2021  9:33 AM Patient Name: TOAN MORT MRN: [id number] Epilepsy Attending: Lora Havens Referring Physician/Provider: Dr Amie Portland Duration: 03/23/2021 1325 to [place] 03/24/2021 0810 to 03/25/2021 0300  Patient history:  71 year old being brought in for evaluation for leftward gaze, seizures followed by left-sided weakness. EEG to evaluate for seizure  Level of alertness: lethargic  AEDs during EEG study: LEV, ativan, propofol  Technical aspects: This EEG study was done with scalp electrodes positioned according to the 10-20 International system of electrode placement. Electrical activity was acquired at a sampling rate of 500Hz  and reviewed with a high frequency filter of 70Hz  and a low frequency filter of 1Hz . EEG data were recorded continuously and digitally stored.  Description: EEG showed continuous generalized low amplitude 3-6 theta-delta slowing admixed with 15 to 18 Hz generalized beta activity predominantly in bilateral frontocentral regions. Frequent spikes were noted in right frontal region. Hyperventilation and photic stimulation were not performed.   Of note, study was technically difficult due to significant myogenic artifact.  Additionally, machine was unplugged at [place] on 04/09/2024 during patient transferred and not replugged until 0810 on 03/24/2021.  ABNORMALITY -Spike, right frontal region -Continuous slow, generalized  IMPRESSION:  This study showed evidence of epileptogenicity arising from right frontal region. Additionally there is severe diffuse encephalopathy, nonspecific etiology but most likely due to sedation. No seizures were seen throughout the recording.  Lora Havens   ECHOCARDIOGRAM COMPLETE  Result Date: 03/24/2021    ECHOCARDIOGRAM REPORT   Patient Name:   DEONDRAE MCGRAIL Date of Exam: 03/24/2021 Medical Rec #:  [id number]       Height:       70.0 in Accession #:    [id number]      Weight:       148.4 lb Date of Birth:  10/11/1950       BSA:          1.839 m Patient Age:    64 years        BP:           125/76 mmHg Patient Gender: M               HR:           79 bpm. Exam Location:  Inpatient Procedure: 2D Echo, Cardiac Doppler and Color Doppler Indications:    Dyspnea R06.00  History:  Patient has no prior history of Echocardiogram examinations.                 Risk Factors:Hypertension.  Sonographer:    Darlina Sicilian RDCS Referring Phys: [place]  1. Left ventricular ejection fraction, by estimation, is 65 to 70%. The left ventricle has normal function. The left ventricle has no regional wall motion abnormalities. Left ventricular diastolic parameters are consistent with Grade I diastolic dysfunction (impaired relaxation).  2. Right ventricular systolic function is normal. The right ventricular size is normal. There is normal pulmonary artery systolic pressure.  3. The mitral valve is grossly normal. No evidence of mitral valve regurgitation. No evidence of mitral stenosis.  4. The aortic valve was not well visualized. Aortic valve regurgitation is not visualized. No aortic stenosis is present.  5. The inferior vena cava is dilated in size with >50% respiratory variability, suggesting right atrial pressure of 8 mmHg. Comparison(s): No prior Echocardiogram. FINDINGS  Left Ventricle: Left ventricular ejection fraction, by estimation, is 65 to 70%. The left ventricle has normal function. The  left ventricle has no regional wall motion abnormalities. The left ventricular internal cavity size was normal in size. There is  no left ventricular hypertrophy. Left ventricular diastolic parameters are consistent with Grade I diastolic dysfunction (impaired relaxation). Right Ventricle: The right ventricular size is normal. No increase in right ventricular wall thickness. Right ventricular systolic function is normal. There is normal pulmonary artery systolic pressure. The tricuspid regurgitant velocity is 2.26 m/s, and  with an assumed right atrial pressure of 8 mmHg, the estimated right ventricular systolic pressure is 45.6 mmHg. Left Atrium: Left atrial size was normal in size. Right Atrium: Right atrial size was normal in size. Pericardium: There is no evidence of pericardial effusion. Mitral Valve: The mitral valve is grossly normal. No evidence of mitral valve regurgitation. No evidence of mitral valve stenosis. Tricuspid Valve: The tricuspid valve is normal in structure. Tricuspid valve regurgitation is mild . No evidence of tricuspid stenosis. Aortic Valve: The aortic valve was not well visualized. Aortic valve regurgitation is not visualized. No aortic stenosis is present. Pulmonic Valve: The pulmonic valve was not well visualized. Pulmonic valve regurgitation is trivial. No evidence of pulmonic stenosis. Aorta: The aortic root and ascending aorta are structurally normal, with no evidence of dilitation. Venous: The inferior vena cava is dilated in size with greater than 50% respiratory variability, suggesting right atrial pressure of 8 mmHg. IAS/Shunts: The atrial septum is grossly normal.  LEFT VENTRICLE PLAX 2D LVIDd:         4.30 cm   Diastology LVIDs:         2.30 cm   LV e' medial:    4.86 cm/s LV PW:         1.00 cm   LV E/e' medial:  11.3 LV IVS:        1.00 cm   LV e' lateral:   6.04 cm/s LVOT diam:     1.90 cm   LV E/e' lateral: 9.1 LV SV:         45 LV SV Index:   25 LVOT Area:     2.84 cm   RIGHT VENTRICLE RV S prime:     11.30 cm/s TAPSE (M-mode): 2.3 cm LEFT ATRIUM           Index        RIGHT ATRIUM          Index LA diam:  1.90 cm 1.03 cm/m   RA Area:     8.96 cm LA Vol (A2C): 12.3 ml 6.69 ml/m   RA Volume:   15.00 ml 8.16 ml/m LA Vol (A4C): 19.7 ml 10.72 ml/m  AORTIC VALVE LVOT Vmax:   71.60 cm/s LVOT Vmean:  50.600 cm/s LVOT VTI:    0.159 m  AORTA Ao Root diam: 3.10 cm MITRAL VALVE               TRICUSPID VALVE MV Area (PHT): 3.60 cm    TR Peak grad:   20.4 mmHg MV Decel Time: 211 msec    TR Vmax:        226.00 cm/s MV E velocity: 54.85 cm/s MV A velocity: 70.05 cm/s  SHUNTS MV E/A ratio:  0.78        Systemic VTI:  0.16 m                            Systemic Diam: 1.90 cm Rudean Haskell MD Electronically signed by Rudean Haskell MD Signature Date/Time: 03/24/2021/3:14:44 PM    Final    CT HEAD CODE STROKE WO CONTRAST  Result Date: 03/23/2021 CLINICAL DATA:  Code stroke.  Old deficit, acute, stroke suspected EXAM: CT HEAD WITHOUT CONTRAST TECHNIQUE: Contiguous axial images were obtained from the base of the skull through the vertex without intravenous contrast. COMPARISON:  10/07/2020 FINDINGS: Brain: Generalized atrophy. No sign of acute infarction, mass lesion, hemorrhage, hydrocephalus or extra-axial collection. Mild chronic small-vessel ischemic change of the white matter. Old lacunar infarction left caudate. Vascular: There is atherosclerotic calcification of the major vessels at the base of the brain. Skull: Negative Sinuses/Orbits: Clear/normal Other: Right forehead scalp lipoma. ASPECTS [facility] Stroke Program Early CT Score) - Ganglionic level infarction (caudate, lentiform nuclei, internal capsule, insula, M1-M3 cortex): 7 - Supraganglionic infarction (M4-M6 cortex): 3 Total score (0-10 with 10 being normal): 10 IMPRESSION: 1. No acute CT finding. Atrophy. Chronic small-vessel ischemic changes of the white matter. Old left caudate lacunar infarction. 2. ASPECTS  is 10 3. These results were communicated to Dr. Rory Percy at 11:19 am on 03/23/2021 by text page via the [facility] messaging system. Electronically Signed   By: Nelson Chimes M.D.   On: 03/23/2021 11:20   CT ANGIO HEAD NECK W WO CM (CODE STROKE)  Result Date: 03/23/2021 CLINICAL DATA:  Stroke. Follow-up. Neurological deficit, acute, stroke suspected. EXAM: CT ANGIOGRAPHY HEAD AND NECK TECHNIQUE: Multidetector CT imaging of the head and neck was performed using the standard protocol during bolus administration of intravenous contrast. Multiplanar CT image reconstructions and MIPs were obtained to evaluate the vascular anatomy. Carotid stenosis measurements (when applicable) are obtained utilizing NASCET criteria, using the distal internal carotid diameter as the denominator. CONTRAST:  40mL OMNIPAQUE IOHEXOL 350 MG/ML SOLN COMPARISON:  Head CT earlier same day FINDINGS: CTA NECK FINDINGS Aortic arch: Aortic arch is normal. Branching pattern is normal without origin stenosis. Right carotid system: Common carotid artery is widely patent to the bifurcation. There is soft and calcified plaque at the carotid bifurcation and ICA bulb. There is no stenosis. Cervical ICA is patent to the skull base. Left carotid system: Common carotid artery is patent to the bifurcation with scattered soft plaque but no stenosis. Carotid bifurcation and ICA bulb show calcified more than soft plaque. No stenosis. Cervical ICA is patent beyond that. Vertebral arteries: Calcified plaque at both vertebral artery origins. The right vertebral artery is dominant. No stenosis  at the right vertebral artery origin. Severe stenosis of the non dominant left vertebral artery origin. Both vessels patent beyond that through the cervical region to the foramen magnum. Skeleton: Scoliotic curvature convex to the right. Mid cervical spondylosis. Other neck: No mass or lymphadenopathy. Nasal intubation with the tip in the oropharynx. Upper chest: Lung apices are  clear. Review of the MIP images confirms the above findings CTA HEAD FINDINGS Anterior circulation: Both internal carotid arteries are widely patent through the skull base. There is atherosclerotic calcification throughout both carotid siphon regions with stenosis estimated at 50% on both sides. The anterior and middle cerebral vessels are patent on both sides. There is moderate stenosis of the left middle cerebral artery inferior division M2 branch. Distal branch vessels do show some atherosclerotic irregularity. No evidence of large vessel occlusion or correctable proximal stenosis. Posterior circulation: Both vertebral arteries are patent through the foramen magnum. The left terminates in PICA. Dominant right vertebral artery supplies the basilar. Superior cerebellar and posterior cerebral arteries show flow. There is atherosclerotic irregularity in the more distal PCA branches. Venous sinuses: Patent and normal. Anatomic variants: None significant. 15 mm enhancing meningioma in the right posterior parietal region without significant mass-effect upon the brain. Review of the MIP images confirms the above findings IMPRESSION: No large vessel occlusion. Atherosclerotic change at both carotid bifurcations without stenosis. Severe stenosis of the non dominant left vertebral artery origin. Left vertebral artery terminates in PICA. Right vertebral artery widely patent. Atherosclerotic disease in both carotid siphon regions with stenosis estimated at 50% on both sides. Moderate stenosis of the left middle cerebral artery inferior division M2 branch. Atherosclerotic narrowing and irregularity within the more distal branch vessels of the anterior, middle and posterior cerebral artery territories. 15 mm right parietal meningioma without significant mass-effect upon the brain. Electronically Signed   By: Nelson Chimes M.D.   On: 03/23/2021 11:40    Assessment/Plan  1. Seizures ([place]) -    Follow-up with neurology -  levETIRAcetam (KEPPRA) 500 MG tablet; Take 1 tablet (500 mg total) by mouth 2 (two) times daily.  Dispense: 60 tablet; Refill: 0 -   For home health Nurse, for medication management  2. Essential hypertension - amLODipine (NORVASC) 10 MG tablet; Take 1 tablet (10 mg total) by mouth daily.  Dispense: 30 tablet; Refill: 0 - metoprolol tartrate (LOPRESSOR) 25 MG tablet; Take 1 tablet (25 mg total) by mouth 2 (two) times daily.  Dispense: 60 tablet; Refill: 0  3. Meningioma (H[facility]) -   There was no signal change in the brain parenchyma adjacent to the meningioma making it less likely the cause for his seizure -    Follow-up with neurology  4. SARS-CoV-2 positive -    Incidental finding during screening test done in preparation for SNF discharge -    Has 1 COVID 19 vaccination -    Completed 10-day isolation  5. Alcohol abuse - folic acid (FOLVITE) 1 MG tablet; Take 1 tablet (1 mg total) by mouth daily.  Dispense: 30 tablet; Refill: 0 - thiamine 100 MG tablet; Take 1 tablet (100 mg total) by mouth daily.  Dispense: 30 tablet; Refill: 0  6. Physical deconditioning -   For home health PT and OT, for therapeutic strengthening exercises     I have filled out patient's discharge paperwork and e-prescribed medications.  Patient will have home health PT, OT and Nurse.  DME provided:  Rolling walker  Total discharge time: Greater than 30 minutes  Greater than  50% was spent in counseling and coordination of care.   Discharge time involved coordination of the discharge process with social worker, nursing staff and therapy department. Medical justification for home health services/DME verified.   Durenda Age, DNP, MSN, FNP-BC [facility] [phone] (Monday-Friday 8:00 a.m. - 5:00 p.m.) [phone] (after hours)

## 2021-04-22 MED ORDER — AMLODIPINE BESYLATE 10 MG PO TABS: 10.0000 mg | ORAL_TABLET | Freq: Every day | ORAL | 0 refills | Status: AC

## 2021-04-22 MED ORDER — LEVETIRACETAM 500 MG PO TABS: 500.0000 mg | ORAL_TABLET | Freq: Two times a day (BID) | ORAL | 0 refills | Status: AC

## 2021-04-22 MED ORDER — THIAMINE HCL 100 MG PO TABS: 100.0000 mg | ORAL_TABLET | Freq: Every day | ORAL | 0 refills | Status: DC

## 2021-04-22 MED ORDER — FOLIC ACID 1 MG PO TABS: 1.0000 mg | ORAL_TABLET | Freq: Every day | ORAL | 0 refills | Status: AC

## 2021-04-22 MED ORDER — METOPROLOL TARTRATE 25 MG PO TABS: 25.0000 mg | ORAL_TABLET | Freq: Two times a day (BID) | ORAL | 0 refills | Status: AC

## 2021-04-23 ENCOUNTER — Encounter: Payer: Self-pay | Admitting: Internal Medicine

## 2021-04-23 DIAGNOSIS — F0390 Unspecified dementia without behavioral disturbance: Secondary | ICD-10-CM | POA: Insufficient documentation

## 2021-05-06 ENCOUNTER — Ambulatory Visit: Payer: Medicare Other | Admitting: Family

## 2021-05-20 ENCOUNTER — Ambulatory Visit: Payer: Medicare Other | Admitting: Family

## 2021-10-06 ENCOUNTER — Encounter (HOSPITAL_COMMUNITY): Payer: Self-pay

## 2021-10-06 ENCOUNTER — Ambulatory Visit (HOSPITAL_COMMUNITY)
Admission: EM | Admit: 2021-10-06 | Discharge: 2021-10-06 | Disposition: A | Discharge status: Home / Self Care | Payer: Medicare Other

## 2021-10-06 DIAGNOSIS — I10 Essential (primary) hypertension: Secondary | ICD-10-CM

## 2021-10-06 DIAGNOSIS — E860 Dehydration: Secondary | ICD-10-CM

## 2021-10-06 DIAGNOSIS — R531 Weakness: Secondary | ICD-10-CM

## 2021-10-06 DIAGNOSIS — E43 Unspecified severe protein-calorie malnutrition: Secondary | ICD-10-CM

## 2021-10-06 NOTE — ED Triage Notes (Signed)
Pts caregivers states pt hasn't been eating or taking his meds in the past 2 wks. States pt is now weak, off balance, and having swelling to his hands.

## 2021-10-06 NOTE — ED Provider Notes (Signed)
[facility]    CSN: [id number] Arrival date & time: 10/06/21  1056      History   Chief Complaint Chief Complaint  Patient presents with   Weakness    HPI Dennis Levine is a 71 y.o. male.   71 year old male presents with weakness.  The neighbors have brought the patient in due to him being weak and not being able to get up and move around and walk.  The patient is not eating over the past several days, only drinking Ensure intermittently.  Neighbors are concerned about him being dehydrated, malnourished, and not getting enough nutrition.  The patient has not taken his medicines in an unknown amount of time.  His blood pressure is significantly elevated today.  I have advised the neighbors to take him over to the emergency room where they can be evaluated, possibly given fluids, and further evaluation.  The patient needs social services assistance probably.     Weakness   Past Medical History:  Diagnosis Date   Cancer [facility])    prostate   Hepatitis    hepatitis c    Hypertension     Patient Active Problem List   Diagnosis Date Noted   Dementia ([place]) 04/23/2021   Benign essential HTN 04/17/2021   Meningioma ([place]) 04/16/2021   Alcohol abuse 04/16/2021   SARS-CoV-2 positive 04/16/2021   CKD (chronic kidney disease), symptom management only, stage 2 (mild) 04/16/2021   Hyponatremia 04/16/2021   Prediabetes 04/16/2021   Protein-calorie malnutrition, severe 03/26/2021   Seizures ([place]) 03/23/2021   Prostate cancer ([place]) 02/03/2016    Past Surgical History:  Procedure Laterality Date   4th index finger straightened  1990's   finger surgery for injury  1970's right hand   PELVIC LYMPH NODE DISSECTION N/A 02/03/2016   Procedure: PELVIC LYMPH NODE DISSECTION;  Surgeon: Ardis Hughs, MD;  Location: [facility] ORS;  Service: Urology;  Laterality: N/A;   ROBOT ASSISTED LAPAROSCOPIC RADICAL PROSTATECTOMY N/A 02/03/2016   Procedure: XI ROBOTIC ASSISTED  LAPAROSCOPIC RADICAL PROSTATECTOMY;  Surgeon: Ardis Hughs, MD;  Location: [facility] ORS;  Service: Urology;  Laterality: N/A;       Home Medications    Prior to Admission medications   Medication Sig Start Date End Date Taking? Authorizing Provider  acetaminophen (TYLENOL) 325 MG tablet Take 650 mg by mouth every 6 (six) hours as needed for mild pain or fever.    [provider]  amLODipine (NORVASC) 10 MG tablet Take 1 tablet (10 mg total) by mouth daily. 04/22/21   Medina-Vargas, Monina C, NP  feeding supplement (ENSURE ENLIVE / ENSURE PLUS) LIQD Take 237 mLs by mouth 3 (three) times daily between meals. 04/13/21   Elgergawy, Silver Huguenin, MD  folic acid (FOLVITE) 1 MG tablet Take 1 tablet (1 mg total) by mouth daily. 04/22/21   Medina-Vargas, Monina C, NP  levETIRAcetam (KEPPRA) 500 MG tablet Take 1 tablet (500 mg total) by mouth 2 (two) times daily. 04/22/21   Medina-Vargas, Monina C, NP  metoprolol tartrate (LOPRESSOR) 25 MG tablet Take 1 tablet (25 mg total) by mouth 2 (two) times daily. 04/22/21   Medina-Vargas, Monina C, NP  Multiple Vitamin (MULTIVITAMIN WITH MINERALS) TABS tablet Take 1 tablet by mouth daily. 04/13/21   Elgergawy, Silver Huguenin, MD  polyethylene glycol (MIRALAX / GLYCOLAX) 17 g packet Take 17 g by mouth daily as needed for moderate constipation. 03/30/21   Shelly Coss, MD  thiamine 100 MG tablet Take 1 tablet (100 mg  total) by mouth daily. 04/22/21   Medina-Vargas, Senaida Lange, NP    Family History History reviewed. No pertinent family history.  Social History Social History   Tobacco Use   Smoking status: Never   Smokeless tobacco: Never  Substance Use Topics   Alcohol use: Not Currently    Comment: 6 pack beer most days   Drug use: Not Currently    Types: Marijuana    Comment: marijuana occ last used 01-31-16 (see UDS)     Allergies   Patient has no known allergies.   Review of Systems Review of Systems  Neurological:  Positive for weakness.      Physical Exam Triage Vital Signs ED Triage Vitals [10/06/21 1233]  Enc Vitals Group     BP (!) 156/121     Pulse Rate (!) 106     Resp 18     Temp (!) 97.1 F (36.2 C)     Temp Source Axillary     SpO2 92 %     Weight      Height      Head Circumference      Peak Flow      Pain Score 0     Pain Loc      Pain Edu?      Excl. in [place]?    No data found.  Updated Vital Signs BP (!) 156/121 (BP Location: Left Arm)   Pulse (!) 106   Temp (!) 97.1 F (36.2 C) (Axillary)   Resp 18   SpO2 92%   Visual Acuity Right Eye Distance:   Left Eye Distance:   Bilateral Distance:    Right Eye Near:   Left Eye Near:    Bilateral Near:     Physical Exam Constitutional:      Appearance: He is ill-appearing.  Cardiovascular:     Rate and Rhythm: Normal rate and regular rhythm.  Neurological:     Mental Status: He is alert.      UC Treatments / Results  Labs (all labs ordered are listed, but only abnormal results are displayed) Labs Reviewed - No data to display  EKG   Radiology No results found.  Procedures Procedures (including critical care time)  Medications Ordered in UC Medications - No data to display  Initial Impression / Assessment and Plan / UC Course  I have reviewed the triage vital signs and the nursing notes.  Pertinent labs & imaging results that were available during my care of the patient were reviewed by me and considered in my medical decision making (see chart for details).    Plan: 1.  Patient has been referred to the emergency room for further evaluation. Final Clinical Impressions(s) / UC Diagnoses   Final diagnoses:  Dehydration  Weakness  Severe protein-calorie malnutrition ([place])  Essential hypertension   Discharge Instructions   None    ED Prescriptions   None    PDMP not reviewed this encounter.   Nyoka Lint, PA-C 10/06/21 1257

## 2021-10-06 NOTE — ED Notes (Signed)
Patient is being discharged from the Urgent Care and sent to the Emergency Department via POV . Per David,PA, patient is in need of higher level of care due to weakness. Patient is aware and verbalizes understanding of plan of care.  Vitals:   10/06/21 1233  BP: (!) 156/121  Pulse: (!) 106  Resp: 18  Temp: (!) 97.1 F (36.2 C)  SpO2: 92%

## 2021-10-07 ENCOUNTER — Inpatient Hospital Stay (HOSPITAL_COMMUNITY)
Admission: EM | Admit: 2021-10-07 | Discharge: 2021-10-11 | DRG: 064 | Disposition: A | Discharge status: Skilled Nursing Facility | Payer: Medicare Other | Attending: Internal Medicine | Admitting: Internal Medicine

## 2021-10-07 ENCOUNTER — Emergency Department (HOSPITAL_COMMUNITY): Payer: Medicare Other

## 2021-10-07 ENCOUNTER — Other Ambulatory Visit: Payer: Self-pay

## 2021-10-07 ENCOUNTER — Encounter (HOSPITAL_COMMUNITY): Payer: Self-pay | Admitting: Emergency Medicine

## 2021-10-07 DIAGNOSIS — I160 Hypertensive urgency: Secondary | ICD-10-CM | POA: Diagnosis present

## 2021-10-07 DIAGNOSIS — E46 Unspecified protein-calorie malnutrition: Secondary | ICD-10-CM | POA: Diagnosis present

## 2021-10-07 DIAGNOSIS — R29702 NIHSS score 2: Secondary | ICD-10-CM | POA: Diagnosis present

## 2021-10-07 DIAGNOSIS — I6389 Other cerebral infarction: Secondary | ICD-10-CM | POA: Diagnosis not present

## 2021-10-07 DIAGNOSIS — I611 Nontraumatic intracerebral hemorrhage in hemisphere, cortical: Secondary | ICD-10-CM | POA: Diagnosis present

## 2021-10-07 DIAGNOSIS — R471 Dysarthria and anarthria: Secondary | ICD-10-CM | POA: Diagnosis present

## 2021-10-07 DIAGNOSIS — I6340 Cerebral infarction due to embolism of unspecified cerebral artery: Secondary | ICD-10-CM | POA: Diagnosis not present

## 2021-10-07 DIAGNOSIS — Z8546 Personal history of malignant neoplasm of prostate: Secondary | ICD-10-CM

## 2021-10-07 DIAGNOSIS — D329 Benign neoplasm of meninges, unspecified: Secondary | ICD-10-CM | POA: Diagnosis present

## 2021-10-07 DIAGNOSIS — E860 Dehydration: Secondary | ICD-10-CM | POA: Diagnosis present

## 2021-10-07 DIAGNOSIS — G40909 Epilepsy, unspecified, not intractable, without status epilepticus: Secondary | ICD-10-CM | POA: Diagnosis present

## 2021-10-07 DIAGNOSIS — Z8619 Personal history of other infectious and parasitic diseases: Secondary | ICD-10-CM

## 2021-10-07 DIAGNOSIS — F1010 Alcohol abuse, uncomplicated: Secondary | ICD-10-CM | POA: Diagnosis present

## 2021-10-07 DIAGNOSIS — I639 Cerebral infarction, unspecified: Secondary | ICD-10-CM | POA: Diagnosis not present

## 2021-10-07 DIAGNOSIS — N182 Chronic kidney disease, stage 2 (mild): Secondary | ICD-10-CM | POA: Diagnosis present

## 2021-10-07 DIAGNOSIS — E870 Hyperosmolality and hypernatremia: Secondary | ICD-10-CM | POA: Diagnosis present

## 2021-10-07 DIAGNOSIS — I6523 Occlusion and stenosis of bilateral carotid arteries: Secondary | ICD-10-CM | POA: Diagnosis present

## 2021-10-07 DIAGNOSIS — R531 Weakness: Secondary | ICD-10-CM

## 2021-10-07 DIAGNOSIS — Z8673 Personal history of transient ischemic attack (TIA), and cerebral infarction without residual deficits: Secondary | ICD-10-CM | POA: Diagnosis not present

## 2021-10-07 DIAGNOSIS — D320 Benign neoplasm of cerebral meninges: Secondary | ICD-10-CM | POA: Diagnosis present

## 2021-10-07 DIAGNOSIS — E871 Hypo-osmolality and hyponatremia: Secondary | ICD-10-CM | POA: Diagnosis not present

## 2021-10-07 DIAGNOSIS — F0390 Unspecified dementia without behavioral disturbance: Secondary | ICD-10-CM | POA: Diagnosis present

## 2021-10-07 DIAGNOSIS — R270 Ataxia, unspecified: Secondary | ICD-10-CM | POA: Diagnosis present

## 2021-10-07 DIAGNOSIS — R2681 Unsteadiness on feet: Secondary | ICD-10-CM | POA: Diagnosis present

## 2021-10-07 DIAGNOSIS — G9340 Encephalopathy, unspecified: Secondary | ICD-10-CM | POA: Diagnosis present

## 2021-10-07 DIAGNOSIS — I63411 Cerebral infarction due to embolism of right middle cerebral artery: Secondary | ICD-10-CM | POA: Diagnosis present

## 2021-10-07 DIAGNOSIS — E785 Hyperlipidemia, unspecified: Secondary | ICD-10-CM | POA: Diagnosis present

## 2021-10-07 DIAGNOSIS — I63433 Cerebral infarction due to embolism of bilateral posterior cerebral arteries: Principal | ICD-10-CM | POA: Diagnosis present

## 2021-10-07 DIAGNOSIS — I129 Hypertensive chronic kidney disease with stage 1 through stage 4 chronic kidney disease, or unspecified chronic kidney disease: Secondary | ICD-10-CM | POA: Diagnosis present

## 2021-10-07 DIAGNOSIS — I63432 Cerebral infarction due to embolism of left posterior cerebral artery: Secondary | ICD-10-CM | POA: Diagnosis present

## 2021-10-07 LAB — URINALYSIS, ROUTINE W REFLEX MICROSCOPIC
Bilirubin Urine: NEGATIVE
Glucose, UA: NEGATIVE mg/dL
Hgb urine dipstick: NEGATIVE
Ketones, ur: NEGATIVE mg/dL
Nitrite: POSITIVE — AB
Protein, ur: NEGATIVE mg/dL
Specific Gravity, Urine: 1.019 (ref 1.005–1.030)
pH: 5 (ref 5.0–8.0)

## 2021-10-07 LAB — COMPREHENSIVE METABOLIC PANEL
ALT: 19 U/L (ref 0–44)
AST: 42 U/L — ABNORMAL HIGH (ref 15–41)
Albumin: 3.4 g/dL — ABNORMAL LOW (ref 3.5–5.0)
Alkaline Phosphatase: 59 U/L (ref 38–126)
Anion gap: 17 — ABNORMAL HIGH (ref 5–15)
BUN: 29 mg/dL — ABNORMAL HIGH (ref 8–23)
CO2: 22 mmol/L (ref 22–32)
Calcium: 9.4 mg/dL (ref 8.9–10.3)
Chloride: 107 mmol/L (ref 98–111)
Creatinine, Ser: 1.11 mg/dL (ref 0.61–1.24)
GFR, Estimated: >60 mL/min (ref >60)
Glucose, Bld: 103 mg/dL — ABNORMAL HIGH (ref 70–99)
Potassium: 6.5 mmol/L (ref 3.5–5.1)
Sodium: 146 mmol/L — ABNORMAL HIGH (ref 135–145)
Total Bilirubin: 1.7 mg/dL — ABNORMAL HIGH (ref 0.3–1.2)
Total Protein: 7.7 g/dL (ref 6.5–8.1)

## 2021-10-07 LAB — CBG MONITORING, ED: Glucose-Capillary: 89 mg/dL (ref 70–99)

## 2021-10-07 LAB — CBC
HCT: 56.5 % — ABNORMAL HIGH (ref 39.0–52.0)
Hemoglobin: 16.5 g/dL (ref 13.0–17.0)
MCH: 27 pg (ref 26.0–34.0)
MCHC: 29.2 g/dL — ABNORMAL LOW (ref 30.0–36.0)
MCV: 92.3 fL (ref 80.0–100.0)
Platelets: 243 10*3/uL (ref 150–400)
RBC: 6.12 MIL/uL — ABNORMAL HIGH (ref 4.22–5.81)
RDW: 18 % — ABNORMAL HIGH (ref 11.5–15.5)
WBC: 9.7 10*3/uL (ref 4.0–10.5)
nRBC: 0 % (ref 0.0–0.2)

## 2021-10-07 LAB — MAGNESIUM: Magnesium: 2.1 mg/dL (ref 1.7–2.4)

## 2021-10-07 LAB — POTASSIUM: Potassium: 4.5 mmol/L (ref 3.5–5.1)

## 2021-10-07 MED ORDER — SENNOSIDES-DOCUSATE SODIUM 8.6-50 MG PO TABS: 1.0000 | ORAL_TABLET | Freq: Every evening | ORAL | Status: DC | PRN

## 2021-10-07 MED ORDER — AMLODIPINE BESYLATE 10 MG PO TABS
10.0000 mg | ORAL_TABLET | Freq: Every day | ORAL | Status: DC
  Administered 2021-10-07 – 2021-10-11 (×5): 10 mg via ORAL
  Filled 2021-10-07 (×5): qty 1

## 2021-10-07 MED ORDER — LEVETIRACETAM 500 MG PO TABS
500.0000 mg | ORAL_TABLET | Freq: Two times a day (BID) | ORAL | Status: DC
  Administered 2021-10-07 – 2021-10-11 (×8): 500 mg via ORAL
  Filled 2021-10-07 (×8): qty 1

## 2021-10-07 MED ORDER — ACETAMINOPHEN 650 MG RE SUPP: 650.0000 mg | RECTAL | Status: DC | PRN

## 2021-10-07 MED ORDER — ACETAMINOPHEN 160 MG/5ML PO SOLN: 650.0000 mg | ORAL | Status: DC | PRN

## 2021-10-07 MED ORDER — STROKE: EARLY STAGES OF RECOVERY BOOK
Freq: Once | Status: AC
  Filled 2021-10-07: qty 1

## 2021-10-07 MED ORDER — METOPROLOL TARTRATE 25 MG PO TABS
25.0000 mg | ORAL_TABLET | Freq: Two times a day (BID) | ORAL | Status: DC
  Administered 2021-10-07 – 2021-10-11 (×8): 25 mg via ORAL
  Filled 2021-10-07 (×8): qty 1

## 2021-10-07 MED ORDER — ENOXAPARIN SODIUM 40 MG/0.4ML IJ SOSY
40.0000 mg | PREFILLED_SYRINGE | INTRAMUSCULAR | Status: DC
  Administered 2021-10-08: 40 mg via SUBCUTANEOUS
  Filled 2021-10-07: qty 0.4

## 2021-10-07 MED ORDER — SODIUM CHLORIDE 0.9 % IV SOLN: INTRAVENOUS | Status: DC

## 2021-10-07 MED ORDER — SODIUM CHLORIDE 0.9 % IV BOLUS
1000.0000 mL | Freq: Once | INTRAVENOUS | Status: AC
  Administered 2021-10-07: 1000 mL via INTRAVENOUS

## 2021-10-07 MED ORDER — ACETAMINOPHEN 325 MG PO TABS: 650.0000 mg | ORAL_TABLET | ORAL | Status: DC | PRN

## 2021-10-07 NOTE — ED Triage Notes (Signed)
Patient brought in by niece for altered mental status the last two weeks. Patient lives alone and niece brings him food every other day but niece states she recently realized he has not been eating or drinking.

## 2021-10-07 NOTE — ED Notes (Signed)
Critical result called from lab. K 6.5. Prosperi PA notified

## 2021-10-07 NOTE — ED Provider Notes (Signed)
[facility] EMERGENCY DEPARTMENT Provider Note     CSN: [id number] Arrival date & time: 10/07/21  1140     History     Chief Complaint  Patient presents with   Altered Mental Status      Dennis Levine is a 71 y.o. male with past medical history significant for prostate cancer, history of seizures with possible history of stroke, meningioma, protein malnutrition, CKD, hypertension, dementia who presents with concern for altered mental status for the last 2 weeks, neice reports that he has had decreased appetite, decreased fluid intake and she is concerned that he is dehydrated.  He is alert and oriented to his baseline however.  Patient reports he just has not had an appetite.  His niece endorses some weight loss.  He is currently taking Ensure.  He is on Keppra prophylactically and has not missed any doses.     Altered Mental Status Associated symptoms: weakness         Home Medications        Prior to Admission medications   Medication Sig Start Date End Date Taking? Authorizing Provider  acetaminophen (TYLENOL) 325 MG tablet Take 650 mg by mouth every 6 (six) hours as needed for mild pain or fever.       [provider]  amLODipine (NORVASC) 10 MG tablet Take 1 tablet (10 mg total) by mouth daily. 04/22/21     Medina-Vargas, Monina C, NP  feeding supplement (ENSURE ENLIVE / ENSURE PLUS) LIQD Take 237 mLs by mouth 3 (three) times daily between meals. 04/13/21     Elgergawy, Silver Huguenin, MD  folic acid (FOLVITE) 1 MG tablet Take 1 tablet (1 mg total) by mouth daily. 04/22/21     Medina-Vargas, Monina C, NP  levETIRAcetam (KEPPRA) 500 MG tablet Take 1 tablet (500 mg total) by mouth 2 (two) times daily. 04/22/21     Medina-Vargas, Monina C, NP  metoprolol tartrate (LOPRESSOR) 25 MG tablet Take 1 tablet (25 mg total) by mouth 2 (two) times daily. 04/22/21     Medina-Vargas, Monina C, NP  Multiple Vitamin (MULTIVITAMIN WITH MINERALS) TABS tablet Take 1 tablet by mouth  daily. 04/13/21     Elgergawy, Silver Huguenin, MD  polyethylene glycol (MIRALAX / GLYCOLAX) 17 g packet Take 17 g by mouth daily as needed for moderate constipation. 03/30/21     Shelly Coss, MD  thiamine 100 MG tablet Take 1 tablet (100 mg total) by mouth daily. 04/22/21     Medina-Vargas, Monina C, NP       Allergies            Patient has no known allergies.     Review of Systems   Review of Systems  Neurological:  Positive for weakness.  All other systems reviewed and are negative.     Physical Exam Updated Vital Signs BP (!) 137/121 (BP Location: Left Arm)   Pulse (!) 109   Temp (!) 97.5 F (36.4 C) (Oral)   Resp 18   SpO2 99%  Physical Exam Vitals and nursing note reviewed.  Constitutional:      General: He is not in acute distress.    Appearance: Normal appearance.     Comments: Patient is quite thin, chronically ill-appearing  HENT:     Head: Normocephalic and atraumatic.  Eyes:     General:        Right eye: No discharge.        Left eye: No discharge.  Cardiovascular:     Rate and Rhythm: Regular rhythm. Tachycardia present.     Heart sounds: No murmur heard.    No friction rub. No gallop.  Pulmonary:     Effort: Pulmonary effort is normal.     Breath sounds: Normal breath sounds.  Abdominal:     General: Bowel sounds are normal.     Palpations: Abdomen is soft.  Skin:    General: Skin is warm and dry.     Capillary Refill: Capillary refill takes less than 2 seconds.  Neurological:     Mental Status: He is alert and oriented to person, place, and time.     Comments: Overall intact finger-nose, heel-to-shin not assessed.  Patient seems to have some lateral tongue deviation and evaluation of hypoglossal nerve.  Tongue points to the left.  Uvula is midline, intact swallow.  Facial movements appear symmetric.  Overall global weakness, but no significant unilateral deficit on shoulder shrug.  Normal appearance of pupils, extraocular movements.  Patient with some  sensory deficit, cannot discriminate touch bilaterally of lower extremities, while touching legs bilaterally patient only endorsed feeling his right leg.  No sensory deficits on upper extremities.  Questionable strength deficit right greater than left.  Unsure patient's baseline but he appears alert and oriented x3.  Psychiatric:        Mood and Affect: Mood normal.        Behavior: Behavior normal.        ED Results / Procedures / Treatments   Labs (all labs ordered are listed, but only abnormal results are displayed)      Labs Reviewed  CBC - Abnormal; Notable for the following components:      Result Value     RBC 6.12 (*)      HCT 56.5 (*)      MCHC 29.2 (*)      RDW 18.0 (*)      All other components within normal limits  COMPREHENSIVE METABOLIC PANEL  URINALYSIS, ROUTINE W REFLEX MICROSCOPIC  CBG MONITORING, ED      EKG None   Radiology Imaging Results (Last 48 hours)  No results found.     Procedures Procedures     Medications Ordered in ED Medications  sodium chloride 0.9 % bolus 1,000 mL (has no administration in time range)      ED Course/ Medical Decision Making/ A&P    Clinical Course as of 10/07/21 1449  Thu Oct 07, 2021  1445 Potassium(!!): 6.5 Normal on recheck -- hemolyzed sample [CP]  1445 Sodium(!): 146 Notable hyperglycemia -- seems dehydrated and malnourished on eval [CP]  1093 Admit based on results of MR [CP]     Clinical Course User Index [CP] Anselmo Pickler, PA-C                            Medical Decision Making Amount and/or Complexity of Data Reviewed Labs: ordered. Decision-making details documented in ED Course. Radiology: ordered.     This patient is a 71 y.o. male who presents to the ED for concern of altered mental status, dehydration, lack of appetite, this involves an extensive number of treatment options, and is a complaint that carries with it a high risk of complications and morbidity. The emergent differential  diagnosis prior to evaluation includes, but is not limited to,  FTR, dehydration, protein calorie malnutrition, encephalopathy, stroke vs other.    This is not an exhaustive differential.  Past Medical History / Co-morbidities / Social History: HTN, prostate cancer, hepatitis   Additional history: Chart reviewed. Pertinent results include: reviewed labwork, imaging from recent admission around 6 months ago, as well as outpatient family medicine visits.   Physical Exam: Physical exam performed. The pertinent findings include: patient is thin, globally weak, somewhat confused but overall oriented. Sensory deficit of left compared to right. Questionable tongue deviation to left. No obvious strength deficits, aphasia, or ataxia.   Lab Tests: I ordered, and personally interpreted labs.  The pertinent results include:  potassium assumed spurious, repeat is normal, we will not alter patient's potassium. Hypernatremia with sodium of 146 noted. Minimally elevated BUN with normal creatinine. Elevated tBili at 1.7. Unclear clinical significance at this time, patient with no abdominal signs and symptoms.   Imaging Studies: I ordered imaging studies including ct head wo contrast, MR brain wo contrast. I independently visualized and interpreted imaging which showed questionable enhancing mass vs. Infarct in right frontal lobe, MR is limited due to motion and inability to tolerate contrast. Favored stroke with hemorrhagic conversion. Unknown last known well, low suspicion for acute stroke at this time. I agree with the radiologist interpretation.   Cardiac Monitoring:  The patient was maintained on a cardiac monitor.  My attending physician Dr. Tomi Bamberger viewed and interpreted the cardiac monitored which showed an underlying rhythm of: Patient with questionable interventricular conduction delay, new QT prolongation, new global TWI from previous. Denies recent chest pain or syncope. I agree with this  interpretation.   Consultations Obtained: I requested consultation with the hospitalist, spoke with Dr. Clancy Gourd,  and discussed lab and imaging findings as well as pertinent plan - they recommend: admission. Consult to neurology pending at time of my handoff.   4:54 PM Care of [name] transferred to [facility] and Dr. Melina Copa at the end of my shift as the patient will require reassessment once labs/imaging have resulted. Patient presentation, ED course, and plan of care discussed with review of all pertinent labs and imaging. Please see his/her note for further details regarding further ED course and disposition. Plan at time of handoff is follow up with neurology for their recommendations.      Dorien Chihuahua 10/07/21 1654    Dorie Rank, MD 10/08/21 [phone]

## 2021-10-07 NOTE — ED Provider Notes (Incomplete)
[facility]PART[facility] Provider Note   CSN: [id number] Arrival date & time: 10/07/21  1140     History {Add pertinent medical, surgical, social history, OB history to HPI:1} Chief Complaint  Patient presents with  . Altered Mental Status    Dennis Levine is a 71 y.o. male with past medical history significant for prostate cancer, history of seizures with possible history of stroke, meningioma, protein malnutrition, CKD, hypertension, dementia who presents with concern for altered mental status for the last 2 weeks, neice reports that he has had decreased appetite, decreased fluid intake and she is concerned that he is dehydrated.  He is alert and oriented to his baseline however.  Patient reports he just has not had an appetite.  His niece endorses some weight loss.  He is currently taking Ensure.  He is on Keppra prophylactically and has not missed any doses.   Altered Mental Status Associated symptoms: weakness        Home Medications Prior to Admission medications   Medication Sig Start Date End Date Taking? Authorizing Provider  acetaminophen (TYLENOL) 325 MG tablet Take 650 mg by mouth every 6 (six) hours as needed for mild pain or fever.    [provider]  amLODipine (NORVASC) 10 MG tablet Take 1 tablet (10 mg total) by mouth daily. 04/22/21   Medina-Vargas, Monina C, NP  feeding supplement (ENSURE ENLIVE / ENSURE PLUS) LIQD Take 237 mLs by mouth 3 (three) times daily between meals. 04/13/21   Elgergawy, Silver Huguenin, MD  folic acid (FOLVITE) 1 MG tablet Take 1 tablet (1 mg total) by mouth daily. 04/22/21   Medina-Vargas, Monina C, NP  levETIRAcetam (KEPPRA) 500 MG tablet Take 1 tablet (500 mg total) by mouth 2 (two) times daily. 04/22/21   Medina-Vargas, Monina C, NP  metoprolol tartrate (LOPRESSOR) 25 MG tablet Take 1 tablet (25 mg total) by mouth 2 (two) times daily. 04/22/21   Medina-Vargas, Monina C, NP  Multiple Vitamin (MULTIVITAMIN WITH  MINERALS) TABS tablet Take 1 tablet by mouth daily. 04/13/21   Elgergawy, Silver Huguenin, MD  polyethylene glycol (MIRALAX / GLYCOLAX) 17 g packet Take 17 g by mouth daily as needed for moderate constipation. 03/30/21   Shelly Coss, MD  thiamine 100 MG tablet Take 1 tablet (100 mg total) by mouth daily. 04/22/21   Medina-Vargas, Monina C, NP      Allergies    Patient has no known allergies.    Review of Systems   Review of Systems  Neurological:  Positive for weakness.  All other systems reviewed and are negative.   Physical Exam Updated Vital Signs BP (!) 137/121 (BP Location: Left Arm)   Pulse (!) 109   Temp (!) 97.5 F (36.4 C) (Oral)   Resp 18   SpO2 99%  Physical Exam Vitals and nursing note reviewed.  Constitutional:      General: He is not in acute distress.    Appearance: Normal appearance.     Comments: Patient is quite thin, chronically ill-appearing  HENT:     Head: Normocephalic and atraumatic.  Eyes:     General:        Right eye: No discharge.        Left eye: No discharge.  Cardiovascular:     Rate and Rhythm: Regular rhythm. Tachycardia present.     Heart sounds: No murmur heard.    No friction rub. No gallop.  Pulmonary:     Effort: Pulmonary effort is  normal.     Breath sounds: Normal breath sounds.  Abdominal:     General: Bowel sounds are normal.     Palpations: Abdomen is soft.  Skin:    General: Skin is warm and dry.     Capillary Refill: Capillary refill takes less than 2 seconds.  Neurological:     Mental Status: He is alert and oriented to person, place, and time.     Comments: Cranial nerves II through XII grossly intact.  Intact finger-nose, intact heel-to-shin.  Romberg negative, gait normal.  Alert and oriented x3.  Moves all 4 limbs spontaneously, normal coordination.  No pronator drift.  Intact strength 4 out of 5 bilateral upper and lower extremities, generalized global weakness, no unilateral strength deficit.    Psychiatric:         Mood and Affect: Mood normal.        Behavior: Behavior normal.    ED Results / Procedures / Treatments   Labs (all labs ordered are listed, but only abnormal results are displayed) Labs Reviewed  CBC - Abnormal; Notable for the following components:      Result Value   RBC 6.12 (*)    HCT 56.5 (*)    MCHC 29.2 (*)    RDW 18.0 (*)    All other components within normal limits  COMPREHENSIVE METABOLIC PANEL  URINALYSIS, ROUTINE W REFLEX MICROSCOPIC  CBG MONITORING, ED    EKG None  Radiology No results found.  Procedures Procedures  {Document cardiac monitor, telemetry assessment procedure when appropriate:1}  Medications Ordered in ED Medications  sodium chloride 0.9 % bolus 1,000 mL (has no administration in time range)    ED Course/ Medical Decision Making/ A&P                           Medical Decision Making Amount and/or Complexity of Data Reviewed Labs: ordered. Radiology: ordered.   This patient is a 71 y.o. male who presents to the ED for concern of ***, this involves an extensive number of treatment options, and is a complaint that carries with it a high risk of complications and morbidity. The emergent differential diagnosis prior to evaluation includes, but is not limited to,  *** .   This is not an exhaustive differential.   Past Medical History / Co-morbidities / Social History: ***  Additional history: Chart reviewed. Pertinent results include: ***  Physical Exam: Physical exam performed. The pertinent findings include: ***  Lab Tests: I ordered, and personally interpreted labs.  The pertinent results include:  ***   Imaging Studies: I ordered imaging studies including ***. I independently visualized and interpreted imaging which showed ***. I agree with the radiologist interpretation.   Cardiac Monitoring:  The patient was maintained on a cardiac monitor.  My attending physician Dr. Marland Kitchen viewed and interpreted the cardiac monitored which  showed an underlying rhythm of: ***. I agree with this interpretation.   Medications: I ordered medication including ***  for ***. Reevaluation of the patient after these medicines showed that the patient {resolved/improved/worsened:23923::"improved"}. I have reviewed the patients home medicines and have made adjustments as needed.  Consultations Obtained: I requested consultation with the ***,  and discussed lab and imaging findings as well as pertinent plan - they recommend: ***   Disposition: After consideration of the diagnostic results and the patients response to treatment, I feel that *** .   ***emergency department workup does not suggest an emergent condition  requiring admission or immediate intervention beyond what has been performed at this time. The plan is: ***. The patient is safe for discharge and has been instructed to return immediately for worsening symptoms, change in symptoms or any other concerns.  I discussed this case with my attending physician Dr. Marland Kitchen who cosigned this note including patient's presenting symptoms, physical exam, and planned diagnostics and interventions. Attending physician stated agreement with plan or made changes to plan which were implemented.    Final Clinical Impression(s) / ED Diagnoses Final diagnoses:  None    Rx / DC Orders ED Discharge Orders     None

## 2021-10-07 NOTE — H&P (Addendum)
History and Physical:    Dennis Levine   RJJ:[id number] DOB: 02-23-1951 DOA: 10/07/2021  Referring MD/provider: Evlyn Kanner, PA PCP: Pcp, No   Patient coming from: Home  Chief Complaint: Generalized weakness  History of Present Illness:   Dennis Levine is a 71 y.o. male with medical history significant for prostate cancer, seizure disorder, stroke, meningioma, CKD stage II, hypertension, dementia, who was brought to the hospital because of generalized weakness, unsteady gait and change in mental status for about 2 weeks.  The patient is unable to provide any history so history was obtained from [name], niece, at the bedside.  She said she had noticed these changes for about 2 weeks.  She noticed that patient was dehydrated and had become weaker over the past 2 days.  His gait had become more unsteady.  Patient was taken to an urgent care clinic day prior to admission where he was found to have elevated blood pressure with diastolic in the 016W. [name] said she noticed that patient had not been taking his medicines for probably more than 2 weeks.  She also mentioned that patient started drinking more water and Ensure yesterday and this improved his mentation and generalized weakness.  She still decided to bring him to the hospital for further evaluation, as recommended by the urgent care clinic.  ED Course:  The patient was given 1 L of normal saline.  ROS:   ROS unable to obtain because of confusion  Past Medical History:   Past Medical History:  Diagnosis Date   Cancer [facility])    prostate   Hepatitis    hepatitis c    Hypertension     Past Surgical History:   Past Surgical History:  Procedure Laterality Date   4th index finger straightened  1990's   finger surgery for injury  1970's right hand   PELVIC LYMPH NODE DISSECTION N/A 02/03/2016   Procedure: PELVIC LYMPH NODE DISSECTION;  Surgeon: Ardis Hughs, MD;  Location: [facility] ORS;  Service: Urology;  Laterality:  N/A;   ROBOT ASSISTED LAPAROSCOPIC RADICAL PROSTATECTOMY N/A 02/03/2016   Procedure: XI ROBOTIC ASSISTED LAPAROSCOPIC RADICAL PROSTATECTOMY;  Surgeon: Ardis Hughs, MD;  Location: [facility] ORS;  Service: Urology;  Laterality: N/A;    Social History:   Social History   Socioeconomic History   Marital status: Divorced    Spouse name: Not on file   Number of children: Not on file   Years of education: Not on file   Highest education level: Not on file  Occupational History   Not on file  Tobacco Use   Smoking status: Never   Smokeless tobacco: Never  Substance and Sexual Activity   Alcohol use: Not Currently    Comment: 6 pack beer most days   Drug use: Not Currently    Types: Marijuana    Comment: marijuana occ last used 01-31-16 (see UDS)   Sexual activity: Not on file  Other Topics Concern   Not on file  Social History Narrative   Not on file   Social Determinants of Health   Financial Resource Strain: Not on file  Food Insecurity: Not on file  Transportation Needs: Not on file  Physical Activity: Not on file  Stress: Not on file  Social Connections: Not on file  Intimate Partner Violence: Not on file    Allergies   Patient has no known allergies.  Family history:   History reviewed. No pertinent family history.  Current Medications:  Prior to Admission medications   Medication Sig Start Date End Date Taking? Authorizing Provider  acetaminophen (TYLENOL) 325 MG tablet Take 650 mg by mouth every 6 (six) hours as needed for mild pain or fever.   Yes [provider]  amLODipine (NORVASC) 10 MG tablet Take 1 tablet (10 mg total) by mouth daily. 04/22/21  Yes Medina-Vargas, Monina C, NP  feeding supplement (ENSURE ENLIVE / ENSURE PLUS) LIQD Take 237 mLs by mouth 3 (three) times daily between meals. 04/13/21  Yes Elgergawy, Silver Huguenin, MD  folic acid (FOLVITE) 1 MG tablet Take 1 tablet (1 mg total) by mouth daily. 04/22/21  Yes Medina-Vargas, Monina C, NP   levETIRAcetam (KEPPRA) 500 MG tablet Take 1 tablet (500 mg total) by mouth 2 (two) times daily. 04/22/21  Yes Medina-Vargas, Monina C, NP  Multiple Vitamin (MULTIVITAMIN WITH MINERALS) TABS tablet Take 1 tablet by mouth daily. 04/13/21  Yes Elgergawy, Silver Huguenin, MD  polyethylene glycol (MIRALAX / GLYCOLAX) 17 g packet Take 17 g by mouth daily as needed for moderate constipation. 03/30/21  Yes Shelly Coss, MD  metoprolol tartrate (LOPRESSOR) 25 MG tablet Take 1 tablet (25 mg total) by mouth 2 (two) times daily. Patient not taking: Reported on 10/07/2021 04/22/21   Medina-Vargas, Monina C, NP  thiamine 100 MG tablet Take 1 tablet (100 mg total) by mouth daily. Patient not taking: Reported on 10/07/2021 04/22/21   Nickola Major, NP    Physical Exam:   Vitals:   10/07/21 1150 10/07/21 1330 10/07/21 1530  BP: (!) 137/121 (!) 150/104 (!) 158/94  Pulse: (!) 109 80 (!) 36  Resp: 18 19 (!) 29  Temp: (!) 97.5 F (36.4 C)    TempSrc: Oral    SpO2: 99% 99% (!) 64%     Physical Exam: Blood pressure (!) 158/94, pulse (!) 36, temperature (!) 97.5 F (36.4 C), temperature source Oral, resp. rate (!) 29, SpO2 (!) 64 %. Gen: No acute distress. Head: Normocephalic, atraumatic. Eyes: Pupils equal, round and reactive to light. Extraocular movements intact.  Sclerae nonicteric.  Mouth: Dry mucous membranes Neck: Supple, no jugular venous distention. Chest: Lungs are clear to auscultation with good air movement. No rales, rhonchi or wheezes.  CV: Heart sounds are regular with an S1, S2. No murmurs, rubs or gallops.  Abdomen: Soft, nontender, nondistended with normal active bowel sounds. No palpable masses. Extremities: Extremities are without clubbing, or cyanosis. No edema. Pedal pulses 2+.  Skin: Warm and dry.  Dystrophic toenails Neuro: Alert and oriented to person only, grossly nonfocal.  Psych: Poor insight and judgment   Data Review:    Labs: Basic Metabolic Panel: Recent Labs   Lab 10/07/21 1211 10/07/21 1354  NA 146*  --   K 6.5* 4.5  CL 107  --   CO2 22  --   GLUCOSE 103*  --   BUN 29*  --   CREATININE 1.11  --   CALCIUM 9.4  --   MG  --  2.1   Liver Function Tests: Recent Labs  Lab 10/07/21 1211  AST 42*  ALT 19  ALKPHOS 59  BILITOT 1.7*  PROT 7.7  ALBUMIN 3.4*   No results for input(s): "LIPASE", "AMYLASE" in the last 168 hours. No results for input(s): "AMMONIA" in the last 168 hours. CBC: Recent Labs  Lab 10/07/21 1211  WBC 9.7  HGB 16.5  HCT 56.5*  MCV 92.3  PLT 243   Cardiac Enzymes: No results for input(s): "CKTOTAL", "CKMB", "  CKMBINDEX", "TROPONINI" in the last 168 hours.  BNP (last 3 results) No results for input(s): "PROBNP" in the last 8760 hours. CBG: Recent Labs  Lab 10/07/21 1224  GLUCAP 89    Urinalysis    Component Value Date/Time   COLORURINE AMBER (A) 10/07/2021 1140   APPEARANCEUR HAZY (A) 10/07/2021 1140   LABSPEC 1.019 10/07/2021 1140   PHURINE 5.0 10/07/2021 1140   GLUCOSEU NEGATIVE 10/07/2021 1140   HGBUR NEGATIVE 10/07/2021 1140   BILIRUBINUR NEGATIVE 10/07/2021 1140   KETONESUR NEGATIVE 10/07/2021 1140   PROTEINUR NEGATIVE 10/07/2021 1140   NITRITE POSITIVE (A) 10/07/2021 1140   LEUKOCYTESUR SMALL (A) 10/07/2021 1140      Radiographic Studies: MR BRAIN WO CONTRAST  Result Date: 10/07/2021 CLINICAL DATA:  Neuro deficit, acute, stroke suspected EXAM: MRI HEAD WITHOUT CONTRAST TECHNIQUE: Multiplanar, multiecho pulse sequences of the brain and surrounding structures were obtained without intravenous contrast. COMPARISON:  December 2022 FINDINGS: Motion artifact is present.  No contrast given. Brain: Heterogeneous signal is present in the right frontal lobe involving middle and inferior gyri with susceptibility and intrinsic T1 hyperintensity likely reflecting blood products. Minor regional mass effect. Similar probable meningioma along the high right parietal convexity without underlying edema.  Prominence of the ventricles and sulci reflects similar parenchymal volume loss. Patchy T2 hyperintensity in the supratentorial white matter is nonspecific but probably reflects stable chronic microvascular ischemic changes. No extra-axial collection. Vascular: Major vessel flow voids at the skull base are preserved. Skull and upper cervical spine: Normal marrow signal is preserved. Sinuses/Orbits: Paranasal sinuses are aerated. Orbits are unremarkable. Other: Sella is unremarkable. Mastoid air cells are clear. Small right anterior frontal scalp lipoma. IMPRESSION: Motion degraded study. Patient could not tolerate contrast portion. Abnormal signal in the right frontal lobe favored to reflect subacute infarction with hemorrhage. However, an underlying lesion cannot be excluded on this study. As there are T1 hyperintense blood products present that would limit evaluation for enhancement, consider repeat contrast study after a few weeks unless there is a strong suspicion for malignancy. Stable small right parietal convexity meningioma. Electronically Signed   By: Macy Mis M.D.   On: 10/07/2021 15:15   CT Head Wo Contrast  Result Date: 10/07/2021 CLINICAL DATA:  Provided history: Mental status change, unknown cause. Additional history provided: Altered mental status for 2 weeks. EXAM: CT HEAD WITHOUT CONTRAST TECHNIQUE: Contiguous axial images were obtained from the base of the skull through the vertex without intravenous contrast. RADIATION DOSE REDUCTION: This exam was performed according to the departmental dose-optimization program which includes automated exposure control, adjustment of the mA and/or kV according to patient size and/or use of iterative reconstruction technique. COMPARISON:  Brain MRI 03/24/2021.  Noncontrast head CT 03/23/2021. FINDINGS: Brain: Mild-to-moderate generalized cerebral atrophy. Comparatively mild cerebellar atrophy. 3.9 x 3.8 x 4.7 region of abnormal cortical and subcortical  hypoattenuation within the mid right frontal lobe/frontal operculum. Associated irregular and gyriform hyperdensity within this lesion. This may reflect a subacute infarct with petechial hemorrhage/cortical laminar necrosis. However, a mass with associated calcification/mild hemorrhage cannot be excluded. Local mass effect without ventricular effacement or midline shift. Redemonstrated 2.0 x 1.1 cm meningioma overlying the right parietal lobe, not significantly changed in size as compared to the prior brain MRI of 03/24/2021. The mass contacts the underlying brain parenchyma without appreciable underlying parenchymal edema. Background mild patchy and ill-defined hypoattenuation within the cerebral white matter, nonspecific but compatible with chronic small vessel ischemic disease. Redemonstrated chronic lacunar infarct within the left caudate  head (series 3, image 17). No extra-axial fluid collection Vascular: No hyperdense vessel.  Atherosclerotic calcifications. Skull: No fracture or aggressive osseous lesion. Sinuses/Orbits: No mass or acute finding within the imaged orbits. No significant paranasal sinus disease at the imaged levels. Other: 13 mm right forehead lipoma. Impression #1 called by telephone at the time of interpretation on 10/07/2021 at 1:35 pm to provider Dr. Tomi Bamberger, who verbally acknowledged these results. IMPRESSION: 3.9 x 3.8 x 4.7 cm lesion with associated mass effect within the mid right frontal lobe/frontal operculum, as described. This may reflect a subacute infarct with associated petechial hemorrhage/cortical laminar necrosis. However, a mass with internal calcification/mild hemorrhage cannot be excluded, and a brain MRI without and with contrast is recommended for further characterization. 2.0 x 1.1 cm meningioma overlying the right parietal lobe, unchanged in size from the prior brain MRI of 03/24/2021. As before, the mass contacts the underlying right parietal lobe without underlying  parenchymal edema. Mild chronic small vessel ischemic changes within the cerebral white matter. Redemonstrated chronic lacunar infarct within the left caudate head. Mild-to-moderate generalized cerebral atrophy. Comparatively mild cerebellar atrophy. Electronically Signed   By: Kellie Simmering D.O.   On: 10/07/2021 13:38    EKG: Independently reviewed.  Normal sinus rhythm, diffuse T wave inversion, prolonged QTc 540   Assessment/Plan:   Principal Problem:   CVA (cerebral vascular accident) ([place]) Active Problems:   Hypernatremia   Generalized weakness   Probable subacute infarction with hemorrhage in the right frontal lobe: Admit to progressive care unit.  Neurologist has been consulted to assist with management.  Obtain 2D echo for further evaluation.  Consult PT, OT and ST.  Hypernatremia, dehydration: He got 1 L of normal saline in the ED.  Start maintenance fluids with normal saline at 100 cc/h.  Monitor BMP  Generalized weakness: Likely multifactorial from dehydration and probable stroke  Seizure disorder: Continue Keppra  Hypertensive urgency: Continue home amlodipine and metoprolol  Other comorbidities include dementia, meningioma, history of prostate cancer, alcohol use disorder  Plan of care was discussed with [name], niece, at the bedside.  She said she wanted to get papers for HPOA.  Consulted chaplain to assist with advanced directives.   Other information:   DVT prophylaxis: enoxaparin (LOVENOX) injection 40 mg Start: 10/08/21 1000 SCD's Start: 10/07/21 1649  Lovenox  Code Status: Full code. Family Communication: [name], niece, at the bedside Disposition Plan: Plan to discharge home in 2 to 3 days Consults called: Neurologist Admission status: Inpatient  The medical decision making on this patient was of high complexity and the patient is at high risk for clinical deterioration, therefore this is a level 3 visit.     Konstantin Lehnen [facility] Pager:  Please check [email or web address]   How to contact the [facility] Attending or Consulting provider [place] or covering provider during after hours [place], for this patient?   Check the care team in [facility] and look for a) attending/consulting [facility] provider listed and b) the [facility] team listed Log into [email or web address] and use [place]'s universal password to access. If you do not have the password, please contact the hospital operator. Locate the [facility] provider you are looking for under [facility] and page to a number that you can be directly reached. If you still have difficulty reaching the provider, please page the [facility] (Director on Call) for the Hospitalists listed on amion for assistance.  10/07/2021, 4:55 PM

## 2021-10-07 NOTE — Consult Note (Signed)
Neurology Consultation  Reason for Consult: Weakness  Referring Physician: Jennye Boroughs, MD  CC: Weakness   History is obtained from: Patient and niece @ the bedside  HPI: Dennis Levine is a 71 y.o. male with h/o old lacunar infarcts, dementia, meningioma, CKD II HTN, (Poorly controlled), prostate cancer, seizures and hepatitis. Patient lives independently and his niece checks on him daily. He also works in [employer] @ his [employer]. She states that for the last 2 weeks she was taking food to his house and another co worker was helping to deliver his meals. She had not seen him for a week and when she arrived @ his house , his meals were not touched , since he was c/o not having an appetite and  also not drinking. He seemed weak and not verbal as usual. He has stopped taking his medications including Keppra and Antihypertensives.  She started to feed him Ensure Protein meals which he tolerated and her concern was his decreased fluid intake and altered mental status. She reports that in the last 2 weekss he has lost some excess weight (not weighed him @ home) He was seen @ [facility] yesterday for being malnourished, dehydrated and hypertensive urgency. After he was evaluated , they were instructed to take him to [facility] ED.   Patient went home last evening and returned to [facility] in AM today ,his work up in ED includes Finzel (non contrast), which reveals atrophy and mild periventricular small vessel disease. There is a prior small lacunar infarct in the caudate nucleus on the left. No acute appearing infarct is seen.  MRI Brain w/o Contrast is a motion degraded study. There is abnormal signal in right frontal lobe favored to be a subacute infarct with hemorrhage. Underlying lesion cannot be excluded.  Patient @ this time denies any headache, visual changes, speech difficulty, swallowing difficulty , dizziness, syncope, numbness, tingling or new weakness  Premorbid modified Rankin  scale (mRS): 3    ROS: Full ROS was performed and is negative except as noted in the HPI. Patient is able to participate  Past Medical History:  Diagnosis Date   Cancer [facility])    prostate   Hepatitis    hepatitis c    Hypertension      History reviewed. No pertinent family history.   Social History:   reports that he has never smoked. He has never used smokeless tobacco. He reports that he does not currently use alcohol. He reports that he does not currently use drugs after having used the following drugs: Marijuana.  Medications  Current Facility-Administered Medications:    [START ON 10/08/2021]  stroke: early stages of recovery book, , Does not apply, Once, Jennye Boroughs, MD   0.9 %  sodium chloride infusion, , Intravenous, Continuous, Jennye Boroughs, MD   acetaminophen (TYLENOL) tablet 650 mg, 650 mg, Oral, Q4H PRN **OR** acetaminophen (TYLENOL) 160 MG/5ML solution 650 mg, 650 mg, Per Tube, Q4H PRN **OR** acetaminophen (TYLENOL) suppository 650 mg, 650 mg, Rectal, Q4H PRN, Jennye Boroughs, MD   amLODipine (NORVASC) tablet 10 mg, 10 mg, Oral, Daily, Jennye Boroughs, MD   Derrill Memo ON 10/08/2021] enoxaparin (LOVENOX) injection 40 mg, 40 mg, Subcutaneous, Q24H, Jennye Boroughs, MD   levETIRAcetam (KEPPRA) tablet 500 mg, 500 mg, Oral, BID, Jennye Boroughs, MD   metoprolol tartrate (LOPRESSOR) tablet 25 mg, 25 mg, Oral, BID, Jennye Boroughs, MD   senna-docusate (Senokot-S) tablet 1 tablet, 1 tablet, Oral, QHS PRN, Jennye Boroughs, MD  Current Outpatient  Medications:    acetaminophen (TYLENOL) 325 MG tablet, Take 650 mg by mouth every 6 (six) hours as needed for mild pain or fever., Disp: , Rfl:    amLODipine (NORVASC) 10 MG tablet, Take 1 tablet (10 mg total) by mouth daily., Disp: 30 tablet, Rfl: 0   feeding supplement (ENSURE ENLIVE / ENSURE PLUS) LIQD, Take 237 mLs by mouth 3 (three) times daily between meals., Disp: 237 mL, Rfl: 12   folic acid (FOLVITE) 1 MG tablet, Take 1 tablet (1 mg  total) by mouth daily., Disp: 30 tablet, Rfl: 0   levETIRAcetam (KEPPRA) 500 MG tablet, Take 1 tablet (500 mg total) by mouth 2 (two) times daily., Disp: 60 tablet, Rfl: 0   Multiple Vitamin (MULTIVITAMIN WITH MINERALS) TABS tablet, Take 1 tablet by mouth daily., Disp: , Rfl:    polyethylene glycol (MIRALAX / GLYCOLAX) 17 g packet, Take 17 g by mouth daily as needed for moderate constipation., Disp: 14 each, Rfl: 0   metoprolol tartrate (LOPRESSOR) 25 MG tablet, Take 1 tablet (25 mg total) by mouth 2 (two) times daily. (Patient not taking: Reported on 10/07/2021), Disp: 60 tablet, Rfl: 0   thiamine 100 MG tablet, Take 1 tablet (100 mg total) by mouth daily. (Patient not taking: Reported on 10/07/2021), Disp: 30 tablet, Rfl: 0   Exam: Current vital signs: BP (!) 158/94   Pulse (!) 36   Temp (!) 97.5 F (36.4 C) (Oral)   Resp (!) 29   SpO2 (!) 64%  Vital signs in last 24 hours: Temp:  [97.5 F (36.4 C)] 97.5 F (36.4 C) (06/29 1150) Pulse Rate:  [36-109] 36 (06/29 1530) Resp:  [18-29] 29 (06/29 1530) BP: (137-158)/(94-121) 158/94 (06/29 1530) SpO2:  [64 %-99 %] 64 % (06/29 1530)  GENERAL: Thin and ill appearing male , Awake, alert  and oriented to person, place not year ,pleasant cooperative and NAD HEENT: - Normocephalic and atraumatic, dry mm,  no Thyromegaly LUNGS - Clear to auscultation bilaterally with no wheezes CV - S1S2 RRR, no m/r/g, equal pulses bilaterally. ABDOMEN - Soft, nontender, nondistended with normoactive BS Ext: warm, well perfused, intact peripheral pulses, no edema  NEURO:  Mental Status: AA&Ox2 (person/ hospital) stated year as 1993 and when corrected , he became argumentative, He was able to recall correct DOB and not age (stated 71 years old) He was able to recall home address correctly and identify his niece and co worker @ bed side Language: speech is  normal Naming, repetition, fluency, and comprehension intact. Cranial Nerves: PERRL 13m reactive bilateral,  EOMI, visual fields full, no facial asymmetry, facial sensation intact, hearing intact, tongue has a slight left deviation/. Uvula/soft palate midline, normal sternocleidomastoid and trapezius muscle strength. No evidence of tongue atrophy or fibrillations Motor: slight RUE drift . Positive Global Weakness of upper extremity  Muscle Strength: RUE 4/5 RLE 5/5  right Grip 4/5                             LUE  4/5 LLE 5/5  left  Grip 4/5 Tone: is normal  Sensation- Intact to light touch bilaterally Coordination: FTN intact bilaterally, no ataxia in BLE. Gait- deferred  NIHSS 1a Level of Conscious.: 0 1b LOC Questions: 0 1c LOC Commands: 0 2 Best Gaze: 0 3 Visual: 0 4 Facial Palsy: 0 5a Motor Arm - left: 0 5b Motor Arm - Right: 1 6a Motor Leg - Left: 0 6b Motor  Leg - Right: 0 7 Limb Ataxia: 1 8 Sensory: 0 9 Best Language: 0 10 Dysarthria: 0 11 Extinct. and Inatten.: 0 TOTAL: 2   Labs I have reviewed labs in epic and the results pertinent to this consultation are:  CBC    Component Value Date/Time   WBC 9.7 10/07/2021 1211   RBC 6.12 (H) 10/07/2021 1211   HGB 16.5 10/07/2021 1211   HCT 56.5 (H) 10/07/2021 1211   PLT 243 10/07/2021 1211   MCV 92.3 10/07/2021 1211   MCH 27.0 10/07/2021 1211   MCHC 29.2 (L) 10/07/2021 1211   RDW 18.0 (H) 10/07/2021 1211   LYMPHSABS 1.3 03/25/2021 0232   MONOABS 0.9 03/25/2021 0232   EOSABS 0.0 03/25/2021 0232   BASOSABS 0.0 03/25/2021 0232    CMP     Component Value Date/Time   NA 146 (H) 10/07/2021 1211   K 4.5 10/07/2021 1354   CL 107 10/07/2021 1211   CO2 22 10/07/2021 1211   GLUCOSE 103 (H) 10/07/2021 1211   BUN 29 (H) 10/07/2021 1211   CREATININE 1.11 10/07/2021 1211   CREATININE 0.91 08/31/2015 1135   CALCIUM 9.4 10/07/2021 1211   PROT 7.7 10/07/2021 1211   ALBUMIN 3.4 (L) 10/07/2021 1211   AST 42 (H) 10/07/2021 1211   ALT 19 10/07/2021 1211   ALKPHOS 59 10/07/2021 1211   BILITOT 1.7 (H) 10/07/2021 1211   GFRNONAA >60  10/07/2021 1211   GFRNONAA 88 08/31/2015 1135   GFRAA >60 02/03/2016 1726   GFRAA >89 08/31/2015 1135    Lipid Panel     Component Value Date/Time   CHOL 262 (H) 08/31/2015 1135   TRIG 43 03/24/2021 0621   HDL 131 08/31/2015 1135   CHOLHDL 2.0 08/31/2015 1135   VLDL 8 08/31/2015 1135   LDLCALC 123 08/31/2015 1135     Imaging I have reviewed the images obtained:  CT-head: 10/06/21 CLINICAL DATA:  Altered mental status    CT HEAD WITHOUT CONTRAST Atrophy with mild periventricular small vessel disease. Prior small lacunar infarct in the head of the caudate nucleus on the left. No acute appearing infarct evident. No mass or hemorrhage.   Foci of arterial vascular calcification noted. There is mild mucosal thickening in several ethmoid air cells.     MRI examination of the brain w/o contrast: 10/07/21: Motion degraded study. Patient could not tolerate contrast portion. Abnormal signal in the right frontal lobe favored to reflect subacute infarction with hemorrhage. However, an underlying lesion cannot be excluded on this study. As there are T1 hyperintense blood products present that would limit evaluation for enhancement, consider repeat contrast study after a few weeks unless there is a strong suspicion for malignancy. Stable small right parietal convexity meningioma.   Assessment:  71 yo male with multiple vascular stroke risk factors, recent encephalopathy secondary to dehydration, anorexia, hypertensive urgency and hyperkalemia, who presents with diffuse weakness and AMS.  - On exam, patient is globally weak  w/o any unilateral weakness, at times confused, however able to answer questions and converse with  examiner and family members.  - Patient's imaging (MRI BRAIN) is limited but does reveal a lesion appearing most consistent with a subacute right frontal lobe stroke with hemorrhagic conversion  Impression: Ischemic Stroke  Meningioma  HTN Seizure  Disorder Malnutrition  Recommendations: - Neuro checks q 4 hours - CTA of head and neck - TTE - Fall Precautions - Dysphagia screening  - Telemetry r/o PAF - 2D Echocardiogram  - Antiplatelet therapy:  Hold ASA  2/2 hemorrhagic conversion - Statin Therapy: Atorvastatin 20 mg qhs with LDL goal <70 - Check Hemoglobin A1c with A1c goal <6.0% - BP management - Check TSH - Resume Keppra 500 mg po q 12 hours (history of seizures) - PT/OT/SLT Consultation - Case Management Consult for discharge planing and safety concerns @ home   - Consider repeat MRI to include post-contrast study after a few weeks, to assess for possible malignancy or vascular malformation underlying the stroke-like lesion seen on MRI this admission  Elenora Gamma , PA-C [facility]  I have seen and examined the patient. I have formulated the assessment and recommendations. 71 year old male presenting with diffuse weakness and AMS. MRI brain reveals a lesion appearing most consistent with a subacute right frontal lobe stroke with hemorrhagic conversion. Exam as documented above. Recommendations to include stroke work up.  Electronically signed: Dr. Kerney Elbe

## 2021-10-08 ENCOUNTER — Inpatient Hospital Stay (HOSPITAL_COMMUNITY): Payer: Medicare Other

## 2021-10-08 DIAGNOSIS — I6389 Other cerebral infarction: Secondary | ICD-10-CM | POA: Diagnosis not present

## 2021-10-08 DIAGNOSIS — E870 Hyperosmolality and hypernatremia: Secondary | ICD-10-CM | POA: Diagnosis not present

## 2021-10-08 DIAGNOSIS — G40909 Epilepsy, unspecified, not intractable, without status epilepticus: Secondary | ICD-10-CM | POA: Diagnosis not present

## 2021-10-08 DIAGNOSIS — F1010 Alcohol abuse, uncomplicated: Secondary | ICD-10-CM

## 2021-10-08 DIAGNOSIS — I639 Cerebral infarction, unspecified: Secondary | ICD-10-CM | POA: Diagnosis not present

## 2021-10-08 DIAGNOSIS — F0390 Unspecified dementia without behavioral disturbance: Secondary | ICD-10-CM

## 2021-10-08 DIAGNOSIS — R531 Weakness: Secondary | ICD-10-CM | POA: Diagnosis not present

## 2021-10-08 LAB — LIPID PANEL
Cholesterol: 164 mg/dL (ref 0–200)
HDL: 40 mg/dL — ABNORMAL LOW (ref >40)
LDL Cholesterol: 106 mg/dL — ABNORMAL HIGH (ref 0–99)
Total CHOL/HDL Ratio: 4.1 RATIO
Triglycerides: 89 mg/dL (ref <150)
VLDL: 18 mg/dL (ref 0–40)

## 2021-10-08 LAB — ECHOCARDIOGRAM COMPLETE
Area-P 1/2: 2.87 cm2
Calc EF: 52.9 %
S' Lateral: 3 cm
Single Plane A2C EF: 53 %
Single Plane A4C EF: 53.2 %

## 2021-10-08 LAB — BASIC METABOLIC PANEL
Anion gap: 9 (ref 5–15)
BUN: 15 mg/dL (ref 8–23)
CO2: 27 mmol/L (ref 22–32)
Calcium: 8.6 mg/dL — ABNORMAL LOW (ref 8.9–10.3)
Chloride: 104 mmol/L (ref 98–111)
Creatinine, Ser: 0.78 mg/dL (ref 0.61–1.24)
GFR, Estimated: >60 mL/min (ref >60)
Glucose, Bld: 101 mg/dL — ABNORMAL HIGH (ref 70–99)
Potassium: 3.7 mmol/L (ref 3.5–5.1)
Sodium: 140 mmol/L (ref 135–145)

## 2021-10-08 LAB — RAPID URINE DRUG SCREEN, HOSP PERFORMED
Amphetamines: NOT DETECTED
Barbiturates: NOT DETECTED
Benzodiazepines: NOT DETECTED
Cocaine: NOT DETECTED
Opiates: NOT DETECTED
Tetrahydrocannabinol: POSITIVE — AB

## 2021-10-08 LAB — AMMONIA: Ammonia: 18 umol/L (ref 9–35)

## 2021-10-08 LAB — HEMOGLOBIN A1C
Hgb A1c MFr Bld: 6 % — ABNORMAL HIGH (ref 4.8–5.6)
Mean Plasma Glucose: 126 mg/dL

## 2021-10-08 LAB — CBC
HCT: 45.2 % (ref 39.0–52.0)
Hemoglobin: 14.1 g/dL (ref 13.0–17.0)
MCH: 27.3 pg (ref 26.0–34.0)
MCHC: 31.2 g/dL (ref 30.0–36.0)
MCV: 87.6 fL (ref 80.0–100.0)
Platelets: 200 10*3/uL (ref 150–400)
RBC: 5.16 MIL/uL (ref 4.22–5.81)
RDW: 15.7 % — ABNORMAL HIGH (ref 11.5–15.5)
WBC: 7.8 10*3/uL (ref 4.0–10.5)
nRBC: 0 % (ref 0.0–0.2)

## 2021-10-08 LAB — CK: Total CK: 62 U/L (ref 49–397)

## 2021-10-08 MED ORDER — ATORVASTATIN CALCIUM 40 MG PO TABS
40.0000 mg | ORAL_TABLET | Freq: Every day | ORAL | Status: DC
Start: 2021-10-08 — End: 2021-10-11
  Administered 2021-10-08 – 2021-10-11 (×4): 40 mg via ORAL
  Filled 2021-10-08 (×4): qty 1

## 2021-10-08 MED ORDER — IOHEXOL 350 MG/ML SOLN
100.0000 mL | Freq: Once | INTRAVENOUS | Status: AC | PRN
  Administered 2021-10-08: 100 mL via INTRAVENOUS

## 2021-10-08 MED ORDER — ASPIRIN 81 MG PO TBEC
81.0000 mg | DELAYED_RELEASE_TABLET | Freq: Every day | ORAL | Status: DC
Start: 2021-10-08 — End: 2021-10-11
  Administered 2021-10-08 – 2021-10-11 (×4): 81 mg via ORAL
  Filled 2021-10-08 (×4): qty 1

## 2021-10-08 MED ORDER — GADOBUTROL 1 MMOL/ML IV SOLN
6.0000 mL | Freq: Once | INTRAVENOUS | Status: AC | PRN
Start: 2021-10-08 — End: 2021-10-08
  Administered 2021-10-08: 6 mL via INTRAVENOUS

## 2021-10-08 NOTE — Progress Notes (Signed)
   10/08/21 0845  Clinical Encounter Type  Visited With Patient;Health care provider  Visit Type Initial  Referral From Physician Jennye Boroughs, MD)  Consult/Referral To Chaplain Melvenia Beam)  Recommendations Advance Directive   Met with Mr. Dennis Levine who was seated in chair for Advance Directive Education. During our interaction it was very obvious that Dennis Levine was not able to follow our conversation, and did not have the ability make decisions regarding the appointing of a [place] or the making of decisions related to his Living Will. While in the room Dennis Levine breakfast tray was delivered and he was not able to answer the questions when asked his own name or date of birth. [address] [place], [place]., [phone]

## 2021-10-08 NOTE — Progress Notes (Signed)
  Echocardiogram 2D Echocardiogram has been performed.  Dennis Levine 10/08/2021, 1:07 PM

## 2021-10-08 NOTE — Progress Notes (Signed)
   10/08/21 1200  SLP Visit Information  SLP Received On 10/08/21  SLP Time Calculation  SLP Start Time (ACUTE ONLY) 1604  SLP Stop Time (ACUTE ONLY) 1617  SLP Time Calculation (min) (ACUTE ONLY) 13 min  Subjective  Subjective alert  General Information  HPI 71 y/o male presenting on 6/29 with weakness and AMS. CT head 3.9 x 3.8 x 4.7 cm lesion with associated mass effect within the mid right frontal lobe/frontal operculum.This may reflect a subacute infarct with associated petechial hemorrhage/cortical laminar necrosis. 2.0 x 1.1 cm meningioma overlying the right parietal lobe, unchanged in size from the prior brain MRI of 03/24/2021. PMH includes: CVA, dementia, meningioma, CKD II, HTN, prostate cancer, seizures, hepatitis  Prior Functional Status  Type of Home Apartment  Available Help at Discharge Family;Available PRN/intermittently  Pain Assessment  Pain Assessment Faces  Faces Pain Scale 0  Pain Intervention(s) Monitored during session  Cognition  Overall Cognitive Status No family/caregiver present to determine baseline cognitive functioning  Arousal/Alertness Awake/alert  Orientation Level Oriented to person;Disoriented to place;Disoriented to time;Disoriented to situation  Attention Sustained  Sustained Attention Appears intact  Memory Impaired  Memory Impairment Storage deficit  Awareness Impaired  Awareness Impairment Intellectual impairment  Auditory Comprehension  Overall Auditory Comprehension Appears within functional limits for tasks assessed  Expression  Primary Mode of Expression Verbal  Verbal Expression  Overall Verbal Expression Appears within functional limits for tasks assessed  Written Expression  Dominant Hand Right  Motor Speech  Overall Motor Speech Appears within functional limits for tasks assessed  SLP - End of Session  Patient left in bed;with call bell/phone within reach;with bed alarm set  Assessment  Clinical Impression Statement (ACUTE ONLY)  Pt friendly and responsive, but with minimal initiation.  Oriented to elements of person, not place/time/situation. Poor awareness, poor registration and short-term memory.  Adequate sustained attention.  Followed simple commands; answered questions with no evidence of expressive language difficulty; comprehension intact. Speech clear and fluent without dysarthria.  Recommend SLP f/u here and at SNF to address cognitive deficits relative to memory, judgment/awareness, orientation.  SLP Recommendation/Assessment Patient needs continued [facility]  SLP Visit Diagnosis Cognitive communication deficit (R41.841)  Problem List Orientation;Memory  Plan  Speech Therapy Frequency (ACUTE ONLY) min 2x/week  Duration 1 week  Treatment/Interventions Cognitive reorganization  Potential to Achieve Goals (ACUTE ONLY) Fair  SLP Recommendations  Follow Up Recommendations Skilled nursing-short term rehab (<3 hours/day)  Assistance recommended at discharge Frequent or constant Supervision/Assistance  Functional Status Assessment Patient has had a recent decline in their functional status and demonstrates the ability to make significant improvements in function in a reasonable and predictable amount of time.  Individuals Consulted  Consulted and Agree with Results and Recommendations Patient  SLP Evaluations  $ SLP Speech Visit 1 Visit  SLP Evaluations  $ SLP EVAL LANGUAGE/SOUND PRODUCTION 1 Procedure

## 2021-10-08 NOTE — Progress Notes (Signed)
CSW left a voicemail for patient's niece, [name], to discuss SNF recommendation. Awaiting call back for assessment and workup.   Laveda Abbe, Jud Clinical Social Worker [phone]

## 2021-10-08 NOTE — Procedures (Signed)
Patient Name: DALLIS CZAJA  MRN: [id number]  Epilepsy Attending: Lora Havens  Referring Physician/Provider: Rosalin Hawking, MD  Date:10/08/2021 Duration: 21.12 mins  Patient history: 71 yo male with multiple vascular stroke risk factors, recent encephalopathy secondary to dehydration, anorexia, hypertensive urgency and hyperkalemia, who presents with diffuse weakness and AMS. EEG to evaluate for seizure.  Level of alertness: Awake, asleep  AEDs during EEG study: LEV  Technical aspects: This EEG study was done with scalp electrodes positioned according to the 10-20 International system of electrode placement. Electrical activity was acquired at a sampling rate of '500Hz'$  and reviewed with a high frequency filter of '70Hz'$  and a low frequency filter of '1Hz'$ . EEG data were recorded continuously and digitally stored.   Description: The posterior dominant rhythm consists of 8-9 Hz activity of moderate voltage (25-35 uV) seen predominantly in posterior head regions, symmetric and reactive to eye opening and eye closing. leep was characterized by vertex waves, sleep spindles (12 to 14 Hz), maximal frontocentral region. Hyperventilation and photic stimulation were not performed.     IMPRESSION: This study is within normal limits. No seizures or epileptiform discharges were seen throughout the recording.  Kelvis Berger [name]

## 2021-10-08 NOTE — Progress Notes (Signed)
EEG complete - results pending 

## 2021-10-08 NOTE — Evaluation (Signed)
Physical Therapy Evaluation Patient Details Name: Dennis Levine MRN: [id number] DOB: 1950/08/20 Today's Date: 10/08/2021  History of Present Illness  Pt is a 71 y/o male presenting on 6/29 with weakness and AMS. MRI with R fontal lobe subacute infarct with hemorrhagic conversion. PMH includes: CVA, dementia, meningioma, CKD II, HTN, prostate cancer, seizures, hepatitis.   Clinical Impression  Pt admitted with above diagnosis. PTA pt lived at home alone, mod I mobility. His niece checked on him daily and provided meals. Pt currently with functional limitations due to the deficits listed below (see PT Problem List). On eval, he required mod assist bed mobility, mod assist sit to stand, and min assist step pivot transfer. He demonstrates deficits in strength, balance, and activity tolerance. Pt will benefit from skilled PT to increase their independence and safety with mobility to allow discharge to the venue listed below.          Recommendations for follow up therapy are one component of a multi-disciplinary discharge planning process, led by the attending physician.  Recommendations may be updated based on patient status, additional functional criteria and insurance authorization.  Follow Up Recommendations Skilled nursing-short term rehab (<3 hours/day) Can patient physically be transported by private vehicle: Yes    Assistance Recommended at Discharge Frequent or constant Supervision/Assistance  Patient can return home with the following  A little help with walking and/or transfers;Assistance with cooking/housework;Direct supervision/assist for medications management;Assist for transportation;A lot of help with bathing/dressing/bathroom;Direct supervision/assist for financial management;Help with stairs or ramp for entrance    Equipment Recommendations Rolling walker (2 wheels)  Recommendations for Other Services       Functional Status Assessment Patient has had a recent decline in  their functional status and demonstrates the ability to make significant improvements in function in a reasonable and predictable amount of time.     Precautions / Restrictions Precautions Precautions: Fall      Mobility  Bed Mobility Overal bed mobility: Needs Assistance Bed Mobility: Supine to Sit     Supine to sit: Mod assist, HOB elevated     General bed mobility comments: +rail, increased time, cues for sequencing    Transfers Overall transfer level: Needs assistance Equipment used: 1 person hand held assist Transfers: Sit to/from Stand, Bed to chair/wheelchair/BSC Sit to Stand: Mod assist   Step pivot transfers: Min assist       General transfer comment: assist to power up and stabilize balance. Pt able to take steps bed to recliner with therapist anterior to pt providing BUE support.    Ambulation/Gait                  Stairs            Wheelchair Mobility    Modified Rankin (Stroke Patients Only) Modified Rankin (Stroke Patients Only) Pre-Morbid Rankin Score: Moderate disability Modified Rankin: Moderately severe disability     Balance Overall balance assessment: Needs assistance Sitting-balance support: Bilateral upper extremity supported, Feet supported Sitting balance-Leahy Scale: Fair     Standing balance support: Bilateral upper extremity supported, During functional activity Standing balance-Leahy Scale: Poor Standing balance comment: reliant on external support                             Pertinent Vitals/Pain Pain Assessment Pain Assessment: Faces Faces Pain Scale: Hurts little more Pain Location: low back Pain Descriptors / Indicators: Grimacing, Discomfort Pain Intervention(s): Repositioned    Home Living Family/patient  expects to be discharged to:: Private residence Living Arrangements: Alone Available Help at Discharge: Family;Available PRN/intermittently Type of Home: Apartment Home Access: Stairs to  enter Entrance Stairs-Rails: None Entrance Stairs-Number of Steps: 3   Home Layout: One level Home Equipment: None Additional Comments: Information in chart from previous admission (03/2021). Pt is a poor historian.    Prior Function Prior Level of Function : Needs assist               ADLs Comments: Per chart, niece checks on pt daily and provides meals.     Hand Dominance   Dominant Hand: Right    Extremity/Trunk Assessment   Upper Extremity Assessment Upper Extremity Assessment: Defer to OT evaluation    Lower Extremity Assessment Lower Extremity Assessment: Generalized weakness (appears symmetrical)    Cervical / Trunk Assessment Cervical / Trunk Assessment: Kyphotic  Communication   Communication: No difficulties  Cognition Arousal/Alertness: Awake/alert Behavior During Therapy: WFL for tasks assessed/performed Overall Cognitive Status: No family/caregiver present to determine baseline cognitive functioning Area of Impairment: Orientation, Attention, Memory, Following commands, Safety/judgement, Awareness, Problem solving                 Orientation Level: Disoriented to, Place, Time, Situation Current Attention Level: Sustained Memory: Decreased short-term memory Following Commands: Follows one step commands consistently, Follows one step commands with increased time Safety/Judgement: Decreased awareness of safety, Decreased awareness of deficits Awareness: Intellectual Problem Solving: Slow processing, Difficulty sequencing, Requires verbal cues General Comments: Pt able to state he was in [place] but thought he was in the police station. Stated the month as February.        General Comments      Exercises     Assessment/Plan    PT Assessment Patient needs continued PT services  PT Problem List Decreased strength;Decreased mobility;Decreased safety awareness;Decreased knowledge of precautions;Decreased balance;Pain       PT Treatment  Interventions Therapeutic activities;DME instruction;Cognitive remediation;Gait training;Therapeutic exercise;Patient/family education;Balance training;Functional mobility training;Stair training    PT Goals (Current goals can be found in the Care Plan section)  Acute Rehab PT Goals Patient Stated Goal: not stated PT Goal Formulation: Patient unable to participate in goal setting Time For Goal Achievement: 10/22/21 Potential to Achieve Goals: Good    Frequency Min 3X/week     Co-evaluation               AM-PAC PT "6 Clicks" Mobility  Outcome Measure Help needed turning from your back to your side while in a flat bed without using bedrails?: A Little Help needed moving from lying on your back to sitting on the side of a flat bed without using bedrails?: A Lot Help needed moving to and from a bed to a chair (including a wheelchair)?: A Little Help needed standing up from a chair using your arms (e.g., wheelchair or bedside chair)?: A Lot Help needed to walk in hospital room?: A Lot Help needed climbing 3-5 steps with a railing? : Total 6 Click Score: 13    End of Session Equipment Utilized During Treatment: Gait belt Activity Tolerance: Patient tolerated treatment well Patient left: in chair;with call bell/phone within reach;with chair alarm set Nurse Communication: Mobility status PT Visit Diagnosis: Other abnormalities of gait and mobility (R26.89);Muscle weakness (generalized) (M62.81)    Time: 2542-7062 PT Time Calculation (min) (ACUTE ONLY): 19 min   Charges:   PT Evaluation $PT Eval Moderate Complexity: 1 Mod          Lorrin Goodell, PT  Office # [phone] Pager [phone]   Lorriane Shire 10/08/2021, 11:03 AM

## 2021-10-08 NOTE — Progress Notes (Signed)
Lower extremity venous bilateral study completed.   Please see CV Proc for preliminary results.   Cleve Paolillo, RDMS, RVT  

## 2021-10-08 NOTE — Progress Notes (Signed)
Progress Note    BRIXON ZHEN  WCH:[id number] DOB: [birth year]  DOA: 10/07/2021 PCP: Pcp, No      Brief Narrative:    Medical records reviewed and are as summarized below:  [name] is a 71 y.o. male with medical history significant for prostate cancer, seizure disorder, stroke, meningioma, CKD stage II, hypertension, dementia, who was brought to the hospital because of generalized weakness, unsteady gait and change in mental status for about 2 weeks.  The patient is unable to provide any history so history was obtained from [name], niece, at the bedside.  She said she had noticed these changes for about 2 weeks.  She noticed that patient was dehydrated and had become weaker over the past 2 days.  His gait had become more unsteady.  Patient was taken to an urgent care clinic day prior to admission where he was found to have elevated blood pressure with diastolic in the 353I. [name] said she noticed that patient had not been taking his medicines for probably more than 2 weeks.  She also mentioned that patient started drinking more water and Ensure yesterday and this improved his mentation and generalized weakness.  She still decided to bring him to the hospital for further evaluation, as recommended by the urgent care clinic.  He was admitted to the hospital for hypernatremia, dehydration, generalized weakness and stroke, probably subacute, with hemorrhagic conversion     Assessment/Plan:   Principal Problem:   CVA (cerebral vascular accident) ([place]) Active Problems:   Hypernatremia   Generalized weakness    Probable subacute stroke with hemorrhagic conversion in the right frontal lobe, confusion: 2D echo showed EF estimated at 50% with normal LV diastolic parameters.  EEG did not show any epileptiform activity.  MRI brain without cannot rule out underlying lesion fluids, patient could not tolerate the contrast portion of the test).  MRI brain with contrast is recommended  for further evaluation.  Follow-up with neurologist for further recommendations.  Hold prophylactic Lovenox for now.  Confusion with underlying dementia: It is unclear what his baseline is.  Continue supportive care.  Hypernatremia: Improved.  Discontinue IV fluids.  Seizure disorder: Continue Keppra  Generalized weakness: PT and OT recommend discharge to SNF.  Follow-up with social worker to assist with disposition.  Other comorbidities include dementia, meningioma, history of prostate cancer, alcohol use disorder   Diet Order             Diet Heart Room service appropriate? Yes; Fluid consistency: Thin  Diet effective now                            Consultants: Neurologist  Procedures: None    Medications:    amLODipine  10 mg Oral Daily   enoxaparin (LOVENOX) injection  40 mg Subcutaneous Q24H   levETIRAcetam  500 mg Oral BID   metoprolol tartrate  25 mg Oral BID   Continuous Infusions:  sodium chloride 100 mL/hr at 10/07/21 1821     Anti-infectives (From admission, onward)    None              Family Communication/Anticipated D/C date and plan/Code Status   DVT prophylaxis: enoxaparin (LOVENOX) injection 40 mg Start: 10/08/21 1000 SCD's Start: 10/07/21 1649     Code Status: Full Code  Family Communication: None Disposition Plan: Plan to discharge to SNF in 2 to 3 days   Status is: Inpatient Remains inpatient  appropriate because: Stroke work-up, will need SNF placement       Subjective:   Interval events noted.  He has no complaints.  He is confused unable to provide an adequate history.  Objective:    Vitals:   10/07/21 1948 10/07/21 2300 10/08/21 0300 10/08/21 0812  BP: (!) 126/98 117/79 121/84 108/75  Pulse:  67 66 (!) 54  Resp: '20 15 16   '$ Temp: 97.6 F (36.4 C) 98.1 F (36.7 C) 97.6 F (36.4 C) 98 F (36.7 C)  TempSrc: Oral Oral Oral Oral  SpO2: 95% 97% 95% 100%   No data found.   Intake/Output  Summary (Last 24 hours) at 10/08/2021 1322 Last data filed at 10/08/2021 0334 Gross per 24 hour  Intake 1400.33 ml  Output 350 ml  Net 1050.33 ml   There were no vitals filed for this visit.  Exam:  GEN: NAD SKIN: Warm and dry EYES: No pallor or icterus ENT: MMM CV: RRR PULM: CTA B ABD: soft, ND, NT, +BS CNS: AAO x 1 (person), nonfocal EXT: No edema or tenderness        Data Reviewed:   I have personally reviewed following labs and imaging studies:  Labs: Labs show the following:   Basic Metabolic Panel: Recent Labs  Lab 10/07/21 1211 10/07/21 1354 10/08/21 0857  NA 146*  --  140  K 6.5* 4.5 3.7  CL 107  --  104  CO2 22  --  27  GLUCOSE 103*  --  101*  BUN 29*  --  15  CREATININE 1.11  --  0.78  CALCIUM 9.4  --  8.6*  MG  --  2.1  --    GFR CrCl cannot be calculated (Unknown ideal weight.). Liver Function Tests: Recent Labs  Lab 10/07/21 1211  AST 42*  ALT 19  ALKPHOS 59  BILITOT 1.7*  PROT 7.7  ALBUMIN 3.4*   No results for input(s): "LIPASE", "AMYLASE" in the last 168 hours. Recent Labs  Lab 10/08/21 0857  AMMONIA 18   Coagulation profile No results for input(s): "INR", "PROTIME" in the last 168 hours.  CBC: Recent Labs  Lab 10/07/21 1211 10/08/21 0857  WBC 9.7 7.8  HGB 16.5 14.1  HCT 56.5* 45.2  MCV 92.3 87.6  PLT 243 200   Cardiac Enzymes: Recent Labs  Lab 10/08/21 0857  CKTOTAL 62   BNP (last 3 results) No results for input(s): "PROBNP" in the last 8760 hours. CBG: Recent Labs  Lab 10/07/21 1224  GLUCAP 89   D-Dimer: No results for input(s): "DDIMER" in the last 72 hours. Hgb A1c: Recent Labs    10/07/21 1831  HGBA1C 6.0*   Lipid Profile: Recent Labs    10/08/21 0233  CHOL 164  HDL 40*  LDLCALC 106*  TRIG 89  CHOLHDL 4.1   Thyroid function studies: No results for input(s): "TSH", "T4TOTAL", "T3FREE", "THYROIDAB" in the last 72 hours.  Invalid input(s): "FREET3" Anemia work up: No results for  input(s): "VITAMINB12", "FOLATE", "FERRITIN", "TIBC", "IRON", "RETICCTPCT" in the last 72 hours. Sepsis Labs: Recent Labs  Lab 10/07/21 1211 10/08/21 0857  WBC 9.7 7.8    Microbiology No results found for this or any previous visit (from the past 240 hour(s)).  Procedures and diagnostic studies:  EEG adult  Result Date: 10/08/2021 Lora Havens, MD     10/08/2021 12:06 PM Patient Name: UGO THOMA MRN: [id number] Epilepsy Attending: Lora Havens Referring Physician/Provider: Rosalin Hawking, MD Date:10/08/2021 Duration: 21.12  mins Patient history: 71 yo male with multiple vascular stroke risk factors, recent encephalopathy secondary to dehydration, anorexia, hypertensive urgency and hyperkalemia, who presents with diffuse weakness and AMS. EEG to evaluate for seizure. Level of alertness: Awake, asleep AEDs during EEG study: LEV Technical aspects: This EEG study was done with scalp electrodes positioned according to the 10-20 International system of electrode placement. Electrical activity was acquired at a sampling rate of '500Hz'$  and reviewed with a high frequency filter of '70Hz'$  and a low frequency filter of '1Hz'$ . EEG data were recorded continuously and digitally stored. Description: The posterior dominant rhythm consists of 8-9 Hz activity of moderate voltage (25-35 uV) seen predominantly in posterior head regions, symmetric and reactive to eye opening and eye closing. leep was characterized by vertex waves, sleep spindles (12 to 14 Hz), maximal frontocentral region. Hyperventilation and photic stimulation were not performed.   IMPRESSION: This study is within normal limits. No seizures or epileptiform discharges were seen throughout the recording. Lora Havens   CT ANGIO HEAD NECK W WO CM  Result Date: 10/08/2021 CLINICAL DATA:  Stroke/TIA, determine embolic source EXAM: CT ANGIOGRAPHY HEAD AND NECK TECHNIQUE: Multidetector CT imaging of the head and neck was performed using the standard  protocol during bolus administration of intravenous contrast. Multiplanar CT image reconstructions and MIPs were obtained to evaluate the vascular anatomy. Carotid stenosis measurements (when applicable) are obtained utilizing NASCET criteria, using the distal internal carotid diameter as the denominator. RADIATION DOSE REDUCTION: This exam was performed according to the departmental dose-optimization program which includes automated exposure control, adjustment of the mA and/or kV according to patient size and/or use of iterative reconstruction technique. CONTRAST:  182m OMNIPAQUE IOHEXOL 350 MG/ML SOLN COMPARISON:  CT and MRI October 07, 2021. CTA 03/23/2021. FINDINGS: CT HEAD FINDINGS Brain: Similar area of abnormal edema and hemorrhage in the right anterior insula and frontal lobe. Similar mass effect. No midline shift. Additional area of hypoattenuation left hippocampus and mesial left temporal lobe and left parasagittal parietal lobe, suggestive of additional probable subacute infarcts. No hydrocephalus. Similar probable meningioma along the right parietal convexity, better characterized on recent MRI Vascular: Detailed below. Skull: No acute fracture. Sinuses: Clear sinuses. Orbits: No acute orbital findings. Review of the MIP images confirms the above findings CTA NECK FINDINGS Aortic arch: Great vessel origins are patent. Right carotid system: Mixed calcific and noncalcific atherosclerosis at the carotid bifurcation with Dennis 30-40% stenosis of the proximal ICA. Left carotid system: Mixed calcific and noncalcific atherosclerosis at the carotid bifurcation without greater than 50% stenosis. Vertebral arteries: Moderate atherosclerotic narrowing of the right vertebral artery origin. 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 Skeleton: Severe multilevel degenerative change in the cervical spine. Other neck: No acute findings. Upper chest: Visualized lung apices are clear.  Review of the MIP images confirms the above findings CTA HEAD FINDINGS Anterior circulation: Similar moderate stenosis of bilateral intracranial ICAs due to at calcific atherosclerosis. Bilateral ACAs and MCAs are patent. Similar moderate stenosis of left M2 MCA branch. Posterior circulation: Poor opacification of the reconstituted left vertebral artery intradurally with superimposed intradural stenosis. Right intradural vertebral artery is patent without significant stenosis. The basilar artery and bilateral posterior cerebral arteries are patent without proximal hemodynamically significant stenosis. Venous sinuses: As permitted by contrast timing, patent. Review of the MIP images confirms the above findings IMPRESSION: CT head: 1. Similar area of abnormal edema and hemorrhage in the right anterior insula and frontal lobe, better characterized  on recent MRI. 2. Additional area of hypoattenuation in the left hippocampus, mesial left temporal lobe and paramidline parietal lobe, suggestive of additional probable subacute infarcts. 3. The above areas demonstrate ill-defined enhancement on the CTA, possibly representing enhancing subacute infarcts. As noted on previous MRI, follow-up MRI with contrast is recommended to exclude underlying mass lesions. 4. Similar probable meningioma along the right parietal convexity, better characterized on recent MRI. CTA: 1. 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 2. Similar moderate bilateral intracranial ICA and moderate left M2 MCA stenosis. 3. Bilateral carotid bifurcation atherosclerosis with Dennis 20-30% right proximal ICA stenosis. Electronically Signed   By: Margaretha Sheffield M.D.   On: 10/08/2021 11:23   MR BRAIN WO CONTRAST  Result Date: 10/07/2021 CLINICAL DATA:  Neuro deficit, acute, stroke suspected EXAM: MRI HEAD WITHOUT CONTRAST TECHNIQUE: Multiplanar, multiecho pulse sequences  of the brain and surrounding structures were obtained without intravenous contrast. COMPARISON:  December 2022 FINDINGS: Motion artifact is present.  No contrast given. Brain: Heterogeneous signal is present in the right frontal lobe involving middle and inferior gyri with susceptibility and intrinsic T1 hyperintensity likely reflecting blood products. Minor regional mass effect. Similar probable meningioma along the high right parietal convexity without underlying edema. Prominence of the ventricles and sulci reflects similar parenchymal volume loss. Patchy T2 hyperintensity in the supratentorial white matter is nonspecific but probably reflects stable chronic microvascular ischemic changes. No extra-axial collection. Vascular: Major vessel flow voids at the skull base are preserved. Skull and upper cervical spine: Normal marrow signal is preserved. Sinuses/Orbits: Paranasal sinuses are aerated. Orbits are unremarkable. Other: Sella is unremarkable. Mastoid air cells are clear. Small right anterior frontal scalp lipoma. IMPRESSION: Motion degraded study. Patient could not tolerate contrast portion. Abnormal signal in the right frontal lobe favored to reflect subacute infarction with hemorrhage. However, an underlying lesion cannot be excluded on this study. As there are T1 hyperintense blood products present that would limit evaluation for enhancement, consider repeat contrast study after a few weeks unless there is a strong suspicion for malignancy. Stable small right parietal convexity meningioma. Electronically Signed   By: Macy Mis M.D.   On: 10/07/2021 15:15   CT Head Wo Contrast  Result Date: 10/07/2021 CLINICAL DATA:  Provided history: Mental status change, unknown cause. Additional history provided: Altered mental status for 2 weeks. EXAM: CT HEAD WITHOUT CONTRAST TECHNIQUE: Contiguous axial images were obtained from the base of the skull through the vertex without intravenous contrast. RADIATION  DOSE REDUCTION: This exam was performed according to the departmental dose-optimization program which includes automated exposure control, adjustment of the mA and/or kV according to patient size and/or use of iterative reconstruction technique. COMPARISON:  Brain MRI 03/24/2021.  Noncontrast head CT 03/23/2021. FINDINGS: Brain: Mild-to-moderate generalized cerebral atrophy. Comparatively mild cerebellar atrophy. 3.9 x 3.8 x 4.7 region of abnormal cortical and subcortical hypoattenuation within the mid right frontal lobe/frontal operculum. Associated irregular and gyriform hyperdensity within this lesion. This may reflect a subacute infarct with petechial hemorrhage/cortical laminar necrosis. However, a mass with associated calcification/mild hemorrhage cannot be excluded. Local mass effect without ventricular effacement or midline shift. Redemonstrated 2.0 x 1.1 cm meningioma overlying the right parietal lobe, not significantly changed in size as compared to the prior brain MRI of 03/24/2021. The mass contacts the underlying brain parenchyma without appreciable underlying parenchymal edema. Background mild patchy and ill-defined hypoattenuation within the cerebral white matter, nonspecific but compatible with chronic  small vessel ischemic disease. Redemonstrated chronic lacunar infarct within the left caudate head (series 3, image 17). No extra-axial fluid collection Vascular: No hyperdense vessel.  Atherosclerotic calcifications. Skull: No fracture or aggressive osseous lesion. Sinuses/Orbits: No mass or acute finding within the imaged orbits. No significant paranasal sinus disease at the imaged levels. Other: 13 mm right forehead lipoma. Impression #1 called by telephone at the time of interpretation on 10/07/2021 at 1:35 pm to provider Dr. Tomi Bamberger, who verbally acknowledged these results. IMPRESSION: 3.9 x 3.8 x 4.7 cm lesion with associated mass effect within the mid right frontal lobe/frontal operculum, as  described. This may reflect a subacute infarct with associated petechial hemorrhage/cortical laminar necrosis. However, a mass with internal calcification/mild hemorrhage cannot be excluded, and a brain MRI without and with contrast is recommended for further characterization. 2.0 x 1.1 cm meningioma overlying the right parietal lobe, unchanged in size from the prior brain MRI of 03/24/2021. As before, the mass contacts the underlying right parietal lobe without underlying parenchymal edema. Mild chronic small vessel ischemic changes within the cerebral white matter. Redemonstrated chronic lacunar infarct within the left caudate head. Mild-to-moderate generalized cerebral atrophy. Comparatively mild cerebellar atrophy. Electronically Signed   By: Kellie Simmering D.O.   On: 10/07/2021 13:38               LOS: 1 day   Ziquan Fidel  [facility]   Pager on [email or web address]. If 7PM-7AM, please contact night-coverage at [email or web address]     10/08/2021, 1:22 PM

## 2021-10-08 NOTE — Progress Notes (Addendum)
STROKE TEAM PROGRESS NOTE   INTERVAL HISTORY Seen in room with echo at the bedside.  EEG shows no evidence of seizure activity.  Consider repeat MRI to obtain contrast studies to rule out malignancy. CT with contrast is suspicious for PCA infarct   Vitals:   10/07/21 1948 10/07/21 2300 10/08/21 0300 10/08/21 0812  BP: (!) 126/98 117/79 121/84 108/75  Pulse:  67 66 (!) 54  Resp: '20 15 16   '$ Temp: 97.6 F (36.4 C) 98.1 F (36.7 C) 97.6 F (36.4 C) 98 F (36.7 C)  TempSrc: Oral Oral Oral Oral  SpO2: 95% 97% 95% 100%   CBC:  Recent Labs  Lab 10/07/21 1211  WBC 9.7  HGB 16.5  HCT 56.5*  MCV 92.3  PLT 696   Basic Metabolic Panel:  Recent Labs  Lab 10/07/21 1211 10/07/21 1354  NA 146*  --   K 6.5* 4.5  CL 107  --   CO2 22  --   GLUCOSE 103*  --   BUN 29*  --   CREATININE 1.11  --   CALCIUM 9.4  --   MG  --  2.1   Lipid Panel:  Recent Labs  Lab 10/08/21 0233  CHOL 164  TRIG 89  HDL 40*  CHOLHDL 4.1  VLDL 18  LDLCALC 106*   HgbA1c: No results for input(s): "HGBA1C" in the last 168 hours. Urine Drug Screen: No results for input(s): "LABOPIA", "COCAINSCRNUR", "LABBENZ", "AMPHETMU", "THCU", "LABBARB" in the last 168 hours.  Alcohol Level No results for input(s): "ETH" in the last 168 hours.  IMAGING past 24 hours MR BRAIN WO CONTRAST  Result Date: 10/07/2021 CLINICAL DATA:  Neuro deficit, acute, stroke suspected EXAM: MRI HEAD WITHOUT CONTRAST TECHNIQUE: Multiplanar, multiecho pulse sequences of the brain and surrounding structures were obtained without intravenous contrast. COMPARISON:  December 2022 FINDINGS: Motion artifact is present.  No contrast given. Brain: Heterogeneous signal is present in the right frontal lobe involving middle and inferior gyri with susceptibility and intrinsic T1 hyperintensity likely reflecting blood products. Minor regional mass effect. Similar probable meningioma along the high right parietal convexity without underlying edema.  Prominence of the ventricles and sulci reflects similar parenchymal volume loss. Patchy T2 hyperintensity in the supratentorial white matter is nonspecific but probably reflects stable chronic microvascular ischemic changes. No extra-axial collection. Vascular: Major vessel flow voids at the skull base are preserved. Skull and upper cervical spine: Normal marrow signal is preserved. Sinuses/Orbits: Paranasal sinuses are aerated. Orbits are unremarkable. Other: Sella is unremarkable. Mastoid air cells are clear. Small right anterior frontal scalp lipoma. IMPRESSION: Motion degraded study. Patient could not tolerate contrast portion. Abnormal signal in the right frontal lobe favored to reflect subacute infarction with hemorrhage. However, an underlying lesion cannot be excluded on this study. As there are T1 hyperintense blood products present that would limit evaluation for enhancement, consider repeat contrast study after a few weeks unless there is a strong suspicion for malignancy. Stable small right parietal convexity meningioma. Electronically Signed   By: Macy Mis M.D.   On: 10/07/2021 15:15   CT Head Wo Contrast  Result Date: 10/07/2021 CLINICAL DATA:  Provided history: Mental status change, unknown cause. Additional history provided: Altered mental status for 2 weeks. EXAM: CT HEAD WITHOUT CONTRAST TECHNIQUE: Contiguous axial images were obtained from the base of the skull through the vertex without intravenous contrast. RADIATION DOSE REDUCTION: This exam was performed according to the departmental dose-optimization program which includes automated exposure control, adjustment of  the mA and/or kV according to patient size and/or use of iterative reconstruction technique. COMPARISON:  Brain MRI 03/24/2021.  Noncontrast head CT 03/23/2021. FINDINGS: Brain: Mild-to-moderate generalized cerebral atrophy. Comparatively mild cerebellar atrophy. 3.9 x 3.8 x 4.7 region of abnormal cortical and subcortical  hypoattenuation within the mid right frontal lobe/frontal operculum. Associated irregular and gyriform hyperdensity within this lesion. This may reflect a subacute infarct with petechial hemorrhage/cortical laminar necrosis. However, a mass with associated calcification/mild hemorrhage cannot be excluded. Local mass effect without ventricular effacement or midline shift. Redemonstrated 2.0 x 1.1 cm meningioma overlying the right parietal lobe, not significantly changed in size as compared to the prior brain MRI of 03/24/2021. The mass contacts the underlying brain parenchyma without appreciable underlying parenchymal edema. Background mild patchy and ill-defined hypoattenuation within the cerebral white matter, nonspecific but compatible with chronic small vessel ischemic disease. Redemonstrated chronic lacunar infarct within the left caudate head (series 3, image 17). No extra-axial fluid collection Vascular: No hyperdense vessel.  Atherosclerotic calcifications. Skull: No fracture or aggressive osseous lesion. Sinuses/Orbits: No mass or acute finding within the imaged orbits. No significant paranasal sinus disease at the imaged levels. Other: 13 mm right forehead lipoma. Impression #1 called by telephone at the time of interpretation on 10/07/2021 at 1:35 pm to provider Dr. Tomi Bamberger, who verbally acknowledged these results. IMPRESSION: 3.9 x 3.8 x 4.7 cm lesion with associated mass effect within the mid right frontal lobe/frontal operculum, as described. This may reflect a subacute infarct with associated petechial hemorrhage/cortical laminar necrosis. However, a mass with internal calcification/mild hemorrhage cannot be excluded, and a brain MRI without and with contrast is recommended for further characterization. 2.0 x 1.1 cm meningioma overlying the right parietal lobe, unchanged in size from the prior brain MRI of 03/24/2021. As before, the mass contacts the underlying right parietal lobe without underlying  parenchymal edema. Mild chronic small vessel ischemic changes within the cerebral white matter. Redemonstrated chronic lacunar infarct within the left caudate head. Mild-to-moderate generalized cerebral atrophy. Comparatively mild cerebellar atrophy. Electronically Signed   By: Kellie Simmering D.O.   On: 10/07/2021 13:38    PHYSICAL EXAM  Physical Exam  Constitutional: Appears well-developed and well-nourished.   Cardiovascular: Normal rate and regular rhythm.  Respiratory: Effort normal, non-labored breathing  Neuro: Mental Status: Patient is awake, alert, oriented to person, place, states it is 1890 and that he is 71 years old. Speech is clear, able to name and repeat objects. Unable to recall events leading up to hospitalization No signs of aphasia or neglect Cranial Nerves: II: Visual Fields right lower quadrantanopia   III,IV, VI: EOMI without ptosis or diploplia.  V: Facial sensation is symmetric to temperature VII: Facial movement is symmetric resting and smiling VIII: Hearing is intact to voice X: Palate elevates symmetrically XI: Shoulder shrug is symmetric. XII: Tongue protrudes midline without atrophy or fasciculations.  Motor: Tone is normal. Bulk is normal. RUE 4/5 LUE 4/5  bilateral grip strength 4/5 RLE  5/5 LLE 5/5 Sensory: Sensation is symmetric to light touch and temperature in the arms and legs. No extinction to DSS present.  Cerebellar: FNF and HKS are intact bilaterally   ASSESSMENT/PLAN Mr. Dennis Levine is a 71 y.o. male with history of old lacunar infarcts, dementia, meningioma, CKD II HTN, (Poorly controlled), prostate cancer, seizures and hepatitis. Patient lives independently and his niece checks on him daily. 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. She states that for the last 2 weeks she  was taking food to his house and another co worker was helping to deliver his meals. She had not seen him for a week and when she arrived at his house  , his meals were not touched , since he was c/o not having an appetite and  also not drinking. He seemed weak and not verbal as usual. He has stopped taking his medications including Keppra and Antihypertensives. Imaging is suspicious for left PCA territory infarct with hemorrhagic conversion, but he will need an MRI w/ contrast to rule out malignancy.  Plan for ASA and statin at this time and 30 day cardiac monitoring and repeat MRI in one month. If a stroke is confirmed, he will need loop recorder at that time and follow up with [facility] outpatient in one month.    Likely stroke with HT, less likely brain tumor - Possible right frontal and left PCA subacute infarcts with HT at right frontal, embolic pattern, etiology not clear, concerning for cardioembolic source CT head - 3.9 x 3.8 x 4.7 cm lesion with associated mass effect within the mid right frontal lobe/frontal operculum. This may reflect a subacute infarct with associated petechial hemorrhage/cortical laminar necrosis. However, a mass with internal calcification/mild hemorrhage cannot be excluded MRI wo contrast - Abnormal signal in the right frontal lobe favored to reflect subacute infarction with hemorrhage.  CT head with contrast - area of abnormal edema and hemorrhage in the right anterior insula and frontal lobe. Additional area of hypoattenuation in the left PCA region, suggestive of additional probable subacute infarcts. The above areas demonstrate ill-defined enhancement on the contrast CT, possibly representing enhancing subacute infarcts. 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 Mod bilateral intracranial ICA and moderate left M2 stenosis. Bilateral carotid bifurcation atherosclerosis with approximately 20-30% right proximal ICA stenosis MRI brain with contrast pending Pt will need repeat MRI with and without contrast in one month to further rule out brain tumor EEG- no evidence of seizure  activity 2D Echo- EF 50% LE venous doppler no DVT Recommend 30 day cardiac event monitoring as outpt to rule out afib LDL 106 HgbA1c 6.0 UDS + THC VTE prophylaxis - lovenox No antithrombotic prior to admission, now on aspirin 81 mg daily.  Therapy recommendations:  SNF Disposition:  pending  Hx of seizure 03/2021 admitted for unresponsiveness, left gaze, seizure with left-sided weakness.  CT head, CT head and neck negative.  MRI also showed no acute finding but stable right parietal small meningioma.  Long-term EEG showed right frontal spikes.  He was discharged on Keppra 500 twice daily Continue keppra 500 bid  Hypertension Home meds:  amlodipine, lopressor '25mg'$  BID Stable BP goal < 160 given HT Long-term BP goal normotensive  Hyperlipidemia Home meds:  None LDL 106, goal < 70 Add atorvastatin '40mg'$  daily  Continue statin at discharge  Stable meningioma  CT head 2.0 x 1.1 cm meningioma overlying the right parietal lobe, unchanged MRI - Stable small right parietal convexity meningioma.  Other Stroke Risk Factors Advanced Age >/= 7  Hx of heavy ETOH use, alcohol level <10, advised to drink no more than 2 drink(s) a day Substance abuse - UDS:  THC POSITIVE, Patient advised to stop using due to stroke risk. Hx stroke/TIA Chronic left caudate head lacunar infarcts noted on imaging  Other Active Problems Protein calorie malnutrition Ensure supplements at baseline., he does have people at home who supply his meals   Hospital day # 1  Patient  seen and examined by NP/APP with MD. MD to update note as needed.   Janine Ores, DNP, FNP-BC [facility] Pager: [phone]  ATTENDING NOTE: I reviewed above note and agree with the assessment and plan. Pt was seen and examined.   71 year old male with history of dementia, hypertension, CKD, seizure, alcohol abuse, hepatitis, prostate cancer and small right parietal meningioma admitted for difficulty speaking,  generalized weakness, decreased appetite, not taking medications/food for about 2 weeks.  CT showed right frontal masslike lesion concerning for subacute infarct with hemorrhagic transformation, although tumor still in differential.  MRI also concerning for right frontal subacute infarct with hemorrhagic transformation.  CT with contrast showed enhancement of right frontal lesion but also left PCA territory, suggesting subacute infarct in both regions.  CT head and neck bilateral ICA siphon stenosis, left M2 moderate stenosis, bilateral ICA bulb atherosclerosis right more than left, left VA origin occlusion but reconstituted at C3.  MRI with contrast pending.  EEG no seizure, EF 50%, LE venous Doppler no DVT LDL 106, A1c 6.0 UDS positive for THC.  Creatinine 1.11 sodium 146.  CK and ammonia level normal.  On exam, patient lying in bed, no family at bedside.  He is orientated to place and year but not to age or months.  No aphasia, however paucity of speech, limited language output but follows all simple commands.  Able to name 3/4 and repeat.  Mild to moderate dysarthria.  No gaze palsy, right lower quadrantanopsia, facial symmetrical.  Left upper extremity slight drift, left hand grip and wrist extension decreased strength. BLE symmetrical. Left FTN ataxic due to left hand/wrist weakness, not out of proportion to the weakness.   Etiology for pt presentation, exam and imaging still more likely subacute bilateral infarcts with hemorrhagic transformation, less likely tumor given left PCA lesion consistent with vascular distribution.  However, if patient cooperate, will do MRI brain with contrast.  Patient still need MRI brain with and without contrast in 1 month to further rule out brain tumor.  Put on aspirin 81 and statin.  Continue Keppra 500 twice daily home meds.  PT/OT recommend SNF.  Recommend 30-day cardiac event monitoring as outpatient to rule out A-fib.  We will follow  For detailed assessment and  plan, please refer to above/below as I have made changes wherever appropriate.   Rosalin Hawking, MD PhD Stroke Neurology 10/08/2021 4:22 PM  I discussed with Dr. Mal Misty. I spent extensive time with the patient, more than 50% of which was spent in counseling and coordination of care, reviewing test results, images and medication, and discussing the diagnosis, treatment plan and potential prognosis. This patient's care requiresreview of multiple databases, neurological assessment, discussion with family, other specialists and medical decision making of high complexity.       To contact Stroke Continuity provider, please refer to [email or web address] After hours, contact General Neurology

## 2021-10-08 NOTE — Evaluation (Signed)
Occupational Therapy Evaluation Patient Details Name: Dennis Levine MRN: [id number] DOB: 1950-07-07 Today's Date: 10/08/2021   History of Present Illness Pt is a 71 y/o male presenting on 6/29 with weakness and AMS. MRI with R fontal lobe subacute infarct with hemorrhagic conversion. PMH includes: CVA, dementia, meningioma, CKD II, HTN, prostate cancer, seizures, hepatitis.   Clinical Impression   PTA patient reports independent with ADLs, mobility; per chart pt's niece brings meals and checks on him.  Admitted for above and presents with problem list below, including impaired cognition, weakness, decreased activity tolerance and impaired balance.  He has a hx of dementia, but is able to follow simple commands with increased time; he is only oriented to self, appears pleasantly confused. Pt demonstrates ability to complete Adls with up to mod assist, transfers with min assist.  He will benefit from further OT services acutely and after dc at SNF level to optimize independence, safety with ADLs and mobility.      Recommendations for follow up therapy are one component of a multi-disciplinary discharge planning process, led by the attending physician.  Recommendations may be updated based on patient status, additional functional criteria and insurance authorization.   Follow Up Recommendations  Skilled nursing-short term rehab (<3 hours/day)    Assistance Recommended at Discharge Frequent or constant Supervision/Assistance  Patient can return home with the following A little help with walking and/or transfers;A little help with bathing/dressing/bathroom;Direct supervision/assist for medications management;Direct supervision/assist for financial management;Assist for transportation;Help with stairs or ramp for entrance;Assistance with cooking/housework    Functional Status Assessment  Patient has had a recent decline in their functional status and demonstrates the ability to make significant  improvements in function in a reasonable and predictable amount of time.  Equipment Recommendations  Other (comment) (defer)    Recommendations for Other Services       Precautions / Restrictions Precautions Precautions: Fall Restrictions Weight Bearing Restrictions: No      Mobility Bed Mobility Overal bed mobility: Needs Assistance Bed Mobility: Supine to Sit, Sit to Supine     Supine to sit: Min assist, HOB elevated Sit to supine: Min assist   General bed mobility comments: trunk support to ascend    Transfers Overall transfer level: Needs assistance Equipment used: 1 person hand held assist Transfers: Sit to/from Stand, Bed to chair/wheelchair/BSC Sit to Stand: Min assist           General transfer comment: to power up and steady from EOB with increased time      Balance Overall balance assessment: Needs assistance Sitting-balance support: Bilateral upper extremity supported, Feet supported Sitting balance-Leahy Scale: Fair Sitting balance - Comments: able to reach feet dynamically with min guard   Standing balance support: Bilateral upper extremity supported, During functional activity Standing balance-Leahy Scale: Poor Standing balance comment: relies on external support                           ADL either performed or assessed with clinical judgement   ADL Overall ADL's : Needs assistance/impaired     Grooming: Minimal assistance;Sitting           Upper Body Dressing : Minimal assistance;Sitting   Lower Body Dressing: Moderate assistance;Sit to/from stand   Toilet Transfer: Minimal assistance;Ambulation Toilet Transfer Details (indicate cue type and reason): simulated Toileting- Clothing Manipulation and Hygiene: Moderate assistance;Sit to/from stand Toileting - Clothing Manipulation Details (indicate cue type and reason): support standing to maitnain balance while  using urinal     Functional mobility during ADLs: Minimal  assistance;Cueing for safety;Cueing for sequencing       Vision   Additional Comments: unable to test- further assessment required     Perception     Praxis      Pertinent Vitals/Pain Pain Assessment Pain Assessment: Faces Faces Pain Scale: No hurt Pain Intervention(s): Monitored during session     Hand Dominance Right   Extremity/Trunk Assessment Upper Extremity Assessment Upper Extremity Assessment: Generalized weakness   Lower Extremity Assessment Lower Extremity Assessment: Defer to PT evaluation   Cervical / Trunk Assessment Cervical / Trunk Assessment: Kyphotic   Communication Communication Communication: No difficulties   Cognition Arousal/Alertness: Awake/alert Behavior During Therapy: WFL for tasks assessed/performed Overall Cognitive Status: History of cognitive impairments - at baseline Area of Impairment: Orientation, Attention, Memory, Following commands, Safety/judgement, Awareness, Problem solving                 Orientation Level: Disoriented to, Place, Time, Situation Current Attention Level: Sustained Memory: Decreased recall of precautions, Decreased short-term memory Following Commands: Follows one step commands consistently, Follows one step commands with increased time, Follows multi-step commands inconsistently Safety/Judgement: Decreased awareness of safety, Decreased awareness of deficits Awareness: Intellectual Problem Solving: Slow processing, Decreased initiation, Difficulty sequencing, Requires verbal cues General Comments: pt reports 1999 as year, follows simple commands with increased time. Pleasantly confused, hx of dementia.     General Comments  VSS, limited session due to EEG arriving    Exercises     Shoulder Instructions      Home Living Family/patient expects to be discharged to:: Private residence Living Arrangements: Alone Available Help at Discharge: Family;Available PRN/intermittently Type of Home:  Apartment Home Access: Stairs to enter Entrance Stairs-Number of Steps: 3 Entrance Stairs-Rails: None Home Layout: One level     Bathroom Shower/Tub: Occupational psychologist: Standard     Home Equipment: None   Additional Comments: Information in chart from previous admission (03/2021). Pt is a poor historian.      Prior Functioning/Environment Prior Level of Function : Needs assist;Patient poor historian/Family not available             Mobility Comments: pt reports independent ADLs Comments: pt reports independnet bathing/dressing and per chart niece checks on pt and provides meals        OT Problem List: Decreased strength;Decreased activity tolerance;Impaired balance (sitting and/or standing);Decreased cognition;Decreased safety awareness;Decreased knowledge of use of DME or AE;Decreased knowledge of precautions      OT Treatment/Interventions: Self-care/ADL training;DME and/or AE instruction;Therapeutic activities;Cognitive remediation/compensation;Patient/family education;Balance training;Therapeutic exercise    OT Goals(Current goals can be found in the care plan section) Acute Rehab OT Goals Patient Stated Goal: none stated OT Goal Formulation: Patient unable to participate in goal setting Time For Goal Achievement: 10/22/21 Potential to Achieve Goals: Fair  OT Frequency: Min 2X/week    Co-evaluation              AM-PAC OT "6 Clicks" Daily Activity     Outcome Measure Help from another person eating meals?: A Little Help from another person taking care of personal grooming?: A Little Help from another person toileting, which includes using toliet, bedpan, or urinal?: A Lot Help from another person bathing (including washing, rinsing, drying)?: A Lot Help from another person to put on and taking off regular upper body clothing?: A Little Help from another person to put on and taking off regular lower body clothing?: A Lot  6 Click Score: 15    End of Session Equipment Utilized During Treatment: Gait belt Nurse Communication: Mobility status  Activity Tolerance: Patient tolerated treatment well Patient left: in bed;with call bell/phone within reach;with bed alarm set;Other (comment) (EEG present)  OT Visit Diagnosis: Other abnormalities of gait and mobility (R26.89);Muscle weakness (generalized) (M62.81);Other symptoms and signs involving cognitive function                Time: 1100-1110 OT Time Calculation (min): 10 min Charges:  OT General Charges $OT Visit: 1 Visit OT Evaluation $OT Eval Moderate Complexity: 1 Mod  Jolaine Artist, OT Acute Rehabilitation Services Office [phone]   Delight Stare 10/08/2021, 12:47 PM

## 2021-10-09 DIAGNOSIS — R531 Weakness: Secondary | ICD-10-CM | POA: Diagnosis not present

## 2021-10-09 DIAGNOSIS — I6340 Cerebral infarction due to embolism of unspecified cerebral artery: Secondary | ICD-10-CM | POA: Diagnosis not present

## 2021-10-09 DIAGNOSIS — E870 Hyperosmolality and hypernatremia: Secondary | ICD-10-CM | POA: Diagnosis not present

## 2021-10-09 NOTE — Progress Notes (Signed)
Progress Note    Dennis Levine  WUJ:[id number] DOB: 07/03/1950  DOA: 10/07/2021 PCP: Pcp, No      Brief Narrative:    Medical records reviewed and are as summarized below:  Dennis Levine is a 71 y.o. male with medical history significant for prostate cancer, seizure disorder, stroke, meningioma, CKD stage II, hypertension, dementia, who was brought to the hospital because of generalized weakness, unsteady gait and change in mental status for about 2 weeks.  The patient is unable to provide any history so history was obtained from Dennis Levine, niece, at the bedside.  She said she had noticed these changes for about 2 weeks.  She noticed that patient was dehydrated and had become weaker over the past 2 days.  His gait had become more unsteady.  Patient was taken to an urgent care clinic day prior to admission where he was found to have elevated blood pressure with diastolic in the 956O. Dennis Levine said she noticed that patient had not been taking his medicines for probably more than 2 weeks.  She also mentioned that patient started drinking more water and Ensure yesterday and this improved his mentation and generalized weakness.  She still decided to bring him to the hospital for further evaluation, as recommended by the urgent care clinic.  He was admitted to the hospital for hypernatremia, dehydration, generalized weakness and stroke, probably subacute, with hemorrhagic conversion  10/09/2021: MRI brain did not reveal brain tumor.  Neurology input is appreciated.  Pursue subacute rehab (SNF placement).  Update to patient's niece.  No new complaints.  Patient is a poor historian.  Assessment/Plan:   Principal Problem:   CVA (cerebral vascular accident) ([facility]) Active Problems:   Hypernatremia   Generalized weakness    Probable subacute stroke with hemorrhagic conversion in the right frontal lobe, confusion: 2D echo showed EF estimated at 50% with normal LV diastolic parameters.  EEG did not  show any epileptiform activity.  MRI brain without cannot rule out underlying lesion fluids, patient could not tolerate the contrast portion of the test).  MRI brain with contrast is recommended for further evaluation.  Follow-up with neurologist for further recommendations.  Hold prophylactic Lovenox for now. 10/09/2021: Pursue disposition was cleared by neurology team.  Patient will need subacute rehab at a skilled nursing facility.  Confusion with underlying dementia: It is unclear what his baseline is.  Continue supportive care. 10/09/2021: History of dementia.  Hypernatremia: -Resolved. -Sodium is 140 today.  Seizure disorder:  -Negative EEG. -Continue Keppra  Generalized weakness: PT and OT recommend discharge to SNF.  Follow-up with social worker to assist with disposition.  Other comorbidities include dementia, meningioma, history of prostate cancer, alcohol use disorder   Diet Order             Diet Heart Room service appropriate? No; Fluid consistency: Thin  Diet effective 0500                  Consultants: Neurologist  Procedures: None    Medications:    amLODipine  10 mg Oral Daily   aspirin EC  81 mg Oral Daily   atorvastatin  40 mg Oral Daily   levETIRAcetam  500 mg Oral BID   metoprolol tartrate  25 mg Oral BID   Continuous Infusions:     Anti-infectives (From admission, onward)    None       Family Communication/Anticipated D/C date and plan/Code Status   DVT prophylaxis: Place and maintain  sequential compression device Start: 10/08/21 1536 SCD's Start: 10/07/21 1649     Code Status: Full Code  Family Communication: None Disposition Plan: Plan to discharge to SNF in 2 to 3 days   Status is: Inpatient Remains inpatient appropriate because: Stroke work-up, will need SNF placement  Subjective:   Interval events noted.  He has no complaints.  He is confused unable to provide an adequate history.  Objective:    Vitals:    10/08/21 2303 10/09/21 0315 10/09/21 0734 10/09/21 1045  BP: 102/82 137/82 129/85 121/80  Pulse: 61 62    Resp: (!) '22 20 15   '$ Temp: 97.6 F (36.4 C) 98.1 F (36.7 C) 98 F (36.7 C) 98.1 F (36.7 C)  TempSrc: Oral Axillary    SpO2: 100% 100% 100% 100%   No data found.   Intake/Output Summary (Last 24 hours) at 10/09/2021 1652 Last data filed at 10/09/2021 0316 Gross per 24 hour  Intake --  Output 450 ml  Net -450 ml    There were no vitals filed for this visit.  Exam:  GEN: Awake and alert.  Not in any distress. HEENT: No pallor or jaundice. Neck: Supple. Lungs: Clear to auscultation. CVS: S1-S2. Abdomen: Soft and nontender. Neuro: Awake and alert.  Patient moves all extremities. Extremities: No leg edema.  Data Reviewed:   I have personally reviewed following labs and imaging studies:  Labs: Labs show the following:   Basic Metabolic Panel: Recent Labs  Lab 10/07/21 1211 10/07/21 1354 10/08/21 0857  NA 146*  --  140  K 6.5* 4.5 3.7  CL 107  --  104  CO2 22  --  27  GLUCOSE 103*  --  101*  BUN 29*  --  15  CREATININE 1.11  --  0.78  CALCIUM 9.4  --  8.6*  MG  --  2.1  --     GFR CrCl cannot be calculated (Unknown ideal weight.). Liver Function Tests: Recent Labs  Lab 10/07/21 1211  AST 42*  ALT 19  ALKPHOS 59  BILITOT 1.7*  PROT 7.7  ALBUMIN 3.4*    No results for input(s): "LIPASE", "AMYLASE" in the last 168 hours. Recent Labs  Lab 10/08/21 0857  AMMONIA 18    Coagulation profile No results for input(s): "INR", "PROTIME" in the last 168 hours.  CBC: Recent Labs  Lab 10/07/21 1211 10/08/21 0857  WBC 9.7 7.8  HGB 16.5 14.1  HCT 56.5* 45.2  MCV 92.3 87.6  PLT 243 200    Cardiac Enzymes: Recent Labs  Lab 10/08/21 0857  CKTOTAL 62    BNP (last 3 results) No results for input(s): "PROBNP" in the last 8760 hours. CBG: Recent Labs  Lab 10/07/21 1224  GLUCAP 89    D-Dimer: No results for input(s): "DDIMER" in the  last 72 hours. Hgb A1c: Recent Labs    10/07/21 1831  HGBA1C 6.0*    Lipid Profile: Recent Labs    10/08/21 0233  CHOL 164  HDL 40*  LDLCALC 106*  TRIG 89  CHOLHDL 4.1    Thyroid function studies: No results for input(s): "TSH", "T4TOTAL", "T3FREE", "THYROIDAB" in the last 72 hours.  Invalid input(s): "FREET3" Anemia work up: No results for input(s): "VITAMINB12", "FOLATE", "FERRITIN", "TIBC", "IRON", "RETICCTPCT" in the last 72 hours. Sepsis Labs: Recent Labs  Lab 10/07/21 1211 10/08/21 0857  WBC 9.7 7.8     Microbiology No results found for this or any previous visit (from the past 240 hour(s)).  Procedures and diagnostic studies:     LOS: 2 days   [facility]   Pager on [email or web address]. If 7PM-7AM, please contact night-coverage at [email or web address]     10/09/2021, 4:52 PM

## 2021-10-09 NOTE — Progress Notes (Addendum)
STROKE TEAM PROGRESS NOTE   INTERVAL HISTORY Seen in room, oriented to person and place today. Hemodynamically and neurologically stable.   Vitals:   10/08/21 2303 10/09/21 0315 10/09/21 0734 10/09/21 1045  BP: 102/82 137/82 129/85 121/80  Pulse: 61 62    Resp: (!) '22 20 15   '$ Temp: 97.6 F (36.4 C) 98.1 F (36.7 C) 98 F (36.7 C) 98.1 F (36.7 C)  TempSrc: Oral Axillary    SpO2: 100% 100% 100% 100%   CBC:  Recent Labs  Lab 10/07/21 1211 10/08/21 0857  WBC 9.7 7.8  HGB 16.5 14.1  HCT 56.5* 45.2  MCV 92.3 87.6  PLT 243 329    Basic Metabolic Panel:  Recent Labs  Lab 10/07/21 1211 10/07/21 1354 10/08/21 0857  NA 146*  --  140  K 6.5* 4.5 3.7  CL 107  --  104  CO2 22  --  27  GLUCOSE 103*  --  101*  BUN 29*  --  15  CREATININE 1.11  --  0.78  CALCIUM 9.4  --  8.6*  MG  --  2.1  --     Lipid Panel:  Recent Labs  Lab 10/08/21 0233  CHOL 164  TRIG 89  HDL 40*  CHOLHDL 4.1  VLDL 18  LDLCALC 106*    HgbA1c:  Recent Labs  Lab 10/07/21 1831  HGBA1C 6.0*   Urine Drug Screen:  Recent Labs  Lab 10/08/21 1128  LABOPIA NONE DETECTED  COCAINSCRNUR NONE DETECTED  LABBENZ NONE DETECTED  AMPHETMU NONE DETECTED  THCU POSITIVE*  LABBARB NONE DETECTED    Alcohol Level No results for input(s): "ETH" in the last 168 hours.  IMAGING past 24 hours VAS [place] LOWER EXTREMITY VENOUS (DVT)  Result Date: 10/09/2021  Lower Venous DVT Study Patient Name:  Dennis Levine  Date of Exam:   10/08/2021 Medical Rec #: [id number]        Accession #:    [id number] Date of Birth: [birth year]        Patient Gender: M Patient Age:   71 years Exam Location:  [facility] Procedure:      VAS [place] LOWER EXTREMITY VENOUS (DVT) Referring Phys: Cornelius Moras XU --------------------------------------------------------------------------------  Indications: Embolic stroke.  Comparison Study: No prior studies. Performing Technologist: Darlin Coco RDMS, RVT  Examination Guidelines: A complete  evaluation includes B-mode imaging, spectral Doppler, color Doppler, and power Doppler as needed of all accessible portions of each vessel. Bilateral testing is considered an integral part of a complete examination. Limited examinations for reoccurring indications may be performed as noted. The reflux portion of the exam is performed with the patient in reverse Trendelenburg.  +---------+---------------+---------+-----------+----------+--------------+ RIGHT    CompressibilityPhasicitySpontaneityPropertiesThrombus Aging +---------+---------------+---------+-----------+----------+--------------+ CFV      Full           Yes      Yes                                 +---------+---------------+---------+-----------+----------+--------------+ SFJ      Full                                                        +---------+---------------+---------+-----------+----------+--------------+ FV Prox  Full                                                        +---------+---------------+---------+-----------+----------+--------------+  FV Mid   Full                                                        +---------+---------------+---------+-----------+----------+--------------+ FV DistalFull                                                        +---------+---------------+---------+-----------+----------+--------------+ PFV      Full                                                        +---------+---------------+---------+-----------+----------+--------------+ POP      Full           Yes      Yes                                 +---------+---------------+---------+-----------+----------+--------------+ PTV      Full                                                        +---------+---------------+---------+-----------+----------+--------------+ PERO     Full                                                         +---------+---------------+---------+-----------+----------+--------------+   +---------+---------------+---------+-----------+----------+--------------+ LEFT     CompressibilityPhasicitySpontaneityPropertiesThrombus Aging +---------+---------------+---------+-----------+----------+--------------+ CFV      Full           Yes      Yes                                 +---------+---------------+---------+-----------+----------+--------------+ SFJ      Full                                                        +---------+---------------+---------+-----------+----------+--------------+ FV Prox  Full                                                        +---------+---------------+---------+-----------+----------+--------------+ FV Mid   Full                                                        +---------+---------------+---------+-----------+----------+--------------+  FV DistalFull                                                        +---------+---------------+---------+-----------+----------+--------------+ PFV      Full                                                        +---------+---------------+---------+-----------+----------+--------------+ POP      Full           Yes      Yes                                 +---------+---------------+---------+-----------+----------+--------------+ PTV      Full                                                        +---------+---------------+---------+-----------+----------+--------------+ PERO     Full                                                        +---------+---------------+---------+-----------+----------+--------------+     Summary: RIGHT: - There is no evidence of deep vein thrombosis in the lower extremity.  - No cystic structure found in the popliteal fossa.  LEFT: - There is no evidence of deep vein thrombosis in the lower extremity.  - No cystic structure found in the popliteal fossa.   *See table(s) above for measurements and observations. Electronically signed by Jamelle Haring on 10/09/2021 at 9:44:48 AM.    Final    MR BRAIN W CONTRAST  Result Date: 10/08/2021 CLINICAL DATA:  Follow-up examination for stroke. EXAM: MRI HEAD WITH CONTRAST TECHNIQUE: Multiplanar, multiecho pulse sequences of the brain and surrounding structures were obtained with intravenous contrast. CONTRAST:  86m GADAVIST GADOBUTROL 1 MMOL/ML IV SOLN COMPARISON:  Prior CTA from earlier the same day as well as additional studies from 10/07/2021. FINDINGS: Brain: Postcontrast sequences of the brain demonstrate irregular serpiginous peripheral enhancement about the previously identified area of signal abnormality at the right frontal operculum. Small amount of adjacent cortical enhancement noted anteriorly as well, with additional patchy enhancement at the anterior insula. Additionally, irregular parenchymal enhancement noted at the mesial left temporal occipital region with involvement of the posterior left hippocampal formation (series 7, image 22). Additional subtle patchy post-contrast enhancement noted within the contralateral right occipital lobe (series 7, image 28). Given these findings, these changes are felt to be most consistent with evolving subacute infarcts, right MCA distribution and bilateral PCA distributions. Evidence for associated hemorrhagic transformation at the right frontal infarct. Right parietal meningioma without significant mass effect again noted. No other abnormal enhancement. No midline shift or significant regional mass effect. No hydrocephalus or extra-axial fluid collection. Vascular: Normal intravascular enhancement seen throughout the major intracranial vasculature. Skull and upper cervical  spine: Craniocervical junction with normal limits. Bone marrow signal intensity normal. Small lipoma noted at the right forehead. Sinuses/Orbits: Globes and orbital soft tissues demonstrate no acute  finding. Paranasal sinuses are largely clear. No significant mastoid effusion. Other: None. IMPRESSION: 1. Patchy post-contrast enhancement about the previously identified area of signal abnormality involving the right frontal operculum, with additional patchy enhancement involving the mesial left temporal occipital region and right occipital lobe. These findings are felt to be most consistent with evolving subacute infarcts, right MCA distribution and bilateral PCA distributions. Associated hemorrhagic transformation at the right frontal infarct. Although malignancy is felt to be unlikely, a short interval follow-up scan to ensure no underlying lesion is present is still recommended. 2. Right parietal meningioma without significant mass effect. Electronically Signed   By: Jeannine Boga M.D.   On: 10/08/2021 19:50   ECHOCARDIOGRAM COMPLETE  Result Date: 10/08/2021    ECHOCARDIOGRAM REPORT   Patient Name:   VALDEZ BRANNAN Date of Exam: 10/08/2021 Medical Rec #:  [id number]       Height:       70.0 in Accession #:    [id number]      Weight:       132.3 lb Date of Birth:  [birth year]       BSA:          1.751 m Patient Age:    48 years        BP:           121/84 mmHg Patient Gender: M               HR:           60 bpm. Exam Location:  Inpatient Procedure: 2D Echo, Color Doppler, Cardiac Doppler and 3D Echo Indications:    Stroke I63.9  History:        Patient has prior history of Echocardiogram examinations, most                 recent 03/24/2021. Risk Factors:Hypertension.  Sonographer:    Bernadene Person RDCS Referring Phys: [place]  1. Abnormal septal motion . Left ventricular ejection fraction, by estimation, is 50%. The left ventricle has low normal function. The left ventricle has no regional wall motion abnormalities. Left ventricular diastolic parameters were normal.  2. Right ventricular systolic function is normal. The right ventricular size is normal. There is normal  pulmonary artery systolic pressure.  3. The mitral valve is abnormal. Trivial mitral valve regurgitation. No evidence of mitral stenosis.  4. The aortic valve is tricuspid. There is mild calcification of the aortic valve. Aortic valve regurgitation is not visualized. Aortic valve sclerosis is present, with no evidence of aortic valve stenosis.  5. The inferior vena cava is normal in size with greater than 50% respiratory variability, suggesting right atrial pressure of 3 mmHg. FINDINGS  Left Ventricle: Abnormal septal motion. Left ventricular ejection fraction, by estimation, is 50%. The left ventricle has low normal function. The left ventricle has no regional wall motion abnormalities. The left ventricular internal cavity size was normal in size. There is no left ventricular hypertrophy. Left ventricular diastolic parameters were normal. Right Ventricle: The right ventricular size is normal. No increase in right ventricular wall thickness. Right ventricular systolic function is normal. There is normal pulmonary artery systolic pressure. The tricuspid regurgitant velocity is 2.21 m/s, and  with an assumed right atrial pressure of 3 mmHg, the estimated right ventricular systolic pressure is 78.4 mmHg. Left  Atrium: Left atrial size was normal in size. Right Atrium: Right atrial size was normal in size. Pericardium: There is no evidence of pericardial effusion. Mitral Valve: The mitral valve is abnormal. There is mild thickening of the mitral valve leaflet(s). Trivial mitral valve regurgitation. No evidence of mitral valve stenosis. Tricuspid Valve: The tricuspid valve is normal in structure. Tricuspid valve regurgitation is mild . No evidence of tricuspid stenosis. Aortic Valve: The aortic valve is tricuspid. There is mild calcification of the aortic valve. Aortic valve regurgitation is not visualized. Aortic valve sclerosis is present, with no evidence of aortic valve stenosis. Pulmonic Valve: The pulmonic valve was  normal in structure. Pulmonic valve regurgitation is not visualized. No evidence of pulmonic stenosis. Aorta: The aortic root is normal in size and structure. Venous: The inferior vena cava is normal in size with greater than 50% respiratory variability, suggesting right atrial pressure of 3 mmHg. IAS/Shunts: No atrial level shunt detected by color flow Doppler.  LEFT VENTRICLE PLAX 2D LVIDd:         4.00 cm     Diastology LVIDs:         3.00 cm     LV e' medial:    3.64 cm/s LV PW:         0.80 cm     LV E/e' medial:  13.5 LV IVS:        0.80 cm     LV e' lateral:   5.35 cm/s LVOT diam:     2.20 cm     LV E/e' lateral: 9.2 LV SV:         35 LV SV Index:   20 LVOT Area:     3.80 cm                             3D Volume EF: LV Volumes (MOD)           3D EF:        49 % LV vol d, MOD A2C: 70.6 ml LV EDV:       101 ml LV vol d, MOD A4C: 74.5 ml LV ESV:       52 ml LV vol s, MOD A2C: 33.2 ml LV SV:        49 ml LV vol s, MOD A4C: 34.9 ml LV SV MOD A2C:     37.4 ml LV SV MOD A4C:     74.5 ml LV SV MOD BP:      38.2 ml RIGHT VENTRICLE RV S prime:     8.61 cm/s TAPSE (M-mode): 1.5 cm LEFT ATRIUM             Index        RIGHT ATRIUM           Index LA diam:        2.10 cm 1.20 cm/m   RA Area:     11.60 cm LA Vol (A2C):   29.5 ml 16.85 ml/m  RA Volume:   26.60 ml  15.19 ml/m LA Vol (A4C):   27.0 ml 15.42 ml/m LA Biplane Vol: 28.3 ml 16.16 ml/m  AORTIC VALVE             PULMONIC VALVE LVOT Vmax:   48.30 cm/s  PR End Diast Vel: 1.99 msec LVOT Vmean:  28.800 cm/s LVOT VTI:    0.093 m  AORTA Ao Root diam: 3.30 cm Ao Asc diam:  3.10 cm MITRAL VALVE               TRICUSPID VALVE MV Area (PHT): 2.87 cm    TR Peak grad:   19.5 mmHg MV Decel Time: 264 msec    TR Vmax:        221.00 cm/s MV E velocity: 49.00 cm/s MV A velocity: 60.00 cm/s  SHUNTS MV E/A ratio:  0.82        Systemic VTI:  0.09 m                            Systemic Diam: 2.20 cm Jenkins Rouge MD Electronically signed by Jenkins Rouge MD Signature Date/Time:  10/08/2021/1:57:19 PM    Final    EEG adult  Result Date: 10/08/2021 Lora Havens, MD     10/08/2021 12:06 PM Patient Name: GAR GLANCE MRN: [id number] Epilepsy Attending: Lora Havens Referring Physician/Provider: Rosalin Hawking, MD Date:10/08/2021 Duration: 21.12 mins Patient history: 72 yo male with multiple vascular stroke risk factors, recent encephalopathy secondary to dehydration, anorexia, hypertensive urgency and hyperkalemia, who presents with diffuse weakness and AMS. EEG to evaluate for seizure. Level of alertness: Awake, asleep AEDs during EEG study: LEV Technical aspects: This EEG study was done with scalp electrodes positioned according to the 10-20 International system of electrode placement. Electrical activity was acquired at a sampling rate of '500Hz'$  and reviewed with a high frequency filter of '70Hz'$  and a low frequency filter of '1Hz'$ . EEG data were recorded continuously and digitally stored. Description: The posterior dominant rhythm consists of 8-9 Hz activity of moderate voltage (25-35 uV) seen predominantly in posterior head regions, symmetric and reactive to eye opening and eye closing. leep was characterized by vertex waves, sleep spindles (12 to 14 Hz), maximal frontocentral region. Hyperventilation and photic stimulation were not performed.   IMPRESSION: This study is within normal limits. No seizures or epileptiform discharges were seen throughout the recording. [place]    PHYSICAL EXAM  Physical Exam  Constitutional: Appears well-developed and well-nourished.   Cardiovascular: Normal rate and regular rhythm.  Respiratory: Effort normal, non-labored breathing  Neuro: Mental Status: Patient is awake, alert, oriented to person, place, states it is 1890 and that he is 71 years old. Speech is clear, able to name and repeat objects. Unable to recall events leading up to hospitalization No signs of aphasia or neglect Cranial Nerves: II: Visual Fields right lower  quadrantanopia   III,IV, VI: EOMI without ptosis or diploplia.  V: Facial sensation is symmetric to temperature VII: Facial movement is symmetric resting and smiling VIII: Hearing is intact to voice X: Palate elevates symmetrically XI: Shoulder shrug is symmetric. XII: Tongue protrudes midline without atrophy or fasciculations.  Motor: Tone is normal. Bulk is normal. RUE 4/5 LUE 4/5  bilateral grip strength 4/5 RLE  5/5 LLE 5/5 Sensory: Sensation is symmetric to light touch and temperature in the arms and legs. No extinction to DSS present.  Cerebellar: FNF and HKS are intact bilaterally   ASSESSMENT/PLAN Mr. Dennis Levine is a 71 y.o. male with history of old lacunar infarcts, dementia, meningioma, CKD II HTN, (Poorly controlled), prostate cancer, seizures and hepatitis. Patient lives independently and his niece checks on him daily. 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. She states that for the last 2 weeks she was taking food to his house and another co worker was helping to deliver his meals. She  had not seen him for a week and when she arrived at his house , his meals were not touched , since he was c/o not having an appetite and  also not drinking. He seemed weak and not verbal as usual. He has stopped taking his medications including Keppra and Antihypertensives. Imaging is suspicious for left PCA territory infarct with hemorrhagic conversion, but he will need an MRI w/ contrast to rule out malignancy.  Plan for ASA and statin at this time and 30 day cardiac monitoring and repeat MRI in one month. If a stroke is confirmed, he will need loop recorder at that time and follow up with [facility] outpatient in one month.    Likely stroke with HT, less likely brain tumor - Possible right frontal and left PCA subacute infarcts with HT at right frontal, embolic pattern, etiology not clear, concerning for cardioembolic source CT head - 3.9 x 3.8 x 4.7 cm lesion with associated  mass effect within the mid right frontal lobe/frontal operculum. This may reflect a subacute infarct with associated petechial hemorrhage/cortical laminar necrosis. However, a mass with internal calcification/mild hemorrhage cannot be excluded MRI wo contrast - Abnormal signal in the right frontal lobe favored to reflect subacute infarction with hemorrhage.  CT head with contrast - area of abnormal edema and hemorrhage in the right anterior insula and frontal lobe. Additional area of hypoattenuation in the left PCA region, suggestive of additional probable subacute infarcts. The above areas demonstrate ill-defined enhancement on the contrast CT, possibly representing enhancing subacute infarcts. 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 Mod bilateral intracranial ICA and moderate left M2 stenosis. Bilateral carotid bifurcation atherosclerosis with approximately 20-30% right proximal ICA stenosis MRI brain with contrast neg for mass. Pt will need repeat MRI with and without contrast in one month to further rule out brain tumor EEG- no evidence of seizure activity 2D Echo- EF 50% LE venous doppler no DVT Recommend 30 day cardiac event monitoring as outpt to rule out afib LDL 106 HgbA1c 6.0 UDS + THC VTE prophylaxis - lovenox No antithrombotic prior to admission, now on aspirin 81 mg daily.  Therapy recommendations:  SNF Disposition:  pending  Hx of seizure 03/2021 admitted for unresponsiveness, left gaze, seizure with left-sided weakness.  CT head, CT head and neck negative.  MRI also showed no acute finding but stable right parietal small meningioma.  Long-term EEG showed right frontal spikes.  He was discharged on Keppra 500 twice daily Continue keppra 500 bid  Hypertension Home meds:  amlodipine, lopressor '25mg'$  BID Stable BP goal < 160 given HT Long-term BP goal normotensive  Hyperlipidemia Home meds:  None LDL 106, goal < 70 Con't  atorvastatin '40mg'$  daily  Continue statin at discharge  Stable meningioma  CT head 2.0 x 1.1 cm meningioma overlying the right parietal lobe, unchanged MRI - Stable small right parietal convexity meningioma.  Other Stroke Risk Factors Advanced Age >/= 23  Hx of heavy ETOH use, alcohol level <10, advised to drink no more than 2 drink(s) a day Substance abuse - UDS:  THC POSITIVE, Patient advised to stop using due to stroke risk. Hx stroke/TIA Chronic left caudate head lacunar infarcts noted on imaging  Other Active Problems Protein calorie malnutrition Ensure supplements at baseline., he does have people at home who supply his meals   Hospital day # 2  Patient seen and examined by NP/APP with MD. MD to update note as needed.  Janine Ores, DNP, FNP-BC [facility] Pager: [phone]  ATTENDING ATTESTATION:  Exam is stable from yesterday.  He is slightly confused and did not know the current date.  MRI with contrast consistent with subacute infarcts and no malignancy.  Recommend repeat MRI outpatient in 1 month with contrast to rule out tumor definitively when hemorrhage has subsided.  Neurology will sign off.  Please call with questions.  Dr. Reeves Forth evaluated pt independently, reviewed imaging, chart, labs. Discussed and formulated plan with the APP. Please see APP note above for details.   Total 36 minutes spent on counseling patient and coordinating care, writing notes and reviewing chart.   Krina Mraz,MD    To contact Stroke Continuity provider, please refer to [email or web address] After hours, contact General Neurology

## 2021-10-10 DIAGNOSIS — E870 Hyperosmolality and hypernatremia: Secondary | ICD-10-CM | POA: Diagnosis not present

## 2021-10-10 DIAGNOSIS — R531 Weakness: Secondary | ICD-10-CM | POA: Diagnosis not present

## 2021-10-10 MED ORDER — ORAL CARE MOUTH RINSE: 15.0000 mL | OROMUCOSAL | Status: DC | PRN

## 2021-10-10 NOTE — Progress Notes (Signed)
Progress Note    Dennis Levine  VWU:[id number] DOB: Apr 19, 1950  DOA: 10/07/2021 PCP: Pcp, No      Brief Narrative:    Medical records reviewed and are as summarized below:  Dennis Levine is a 71 y.o. male with medical history significant for prostate cancer, seizure disorder, stroke, meningioma, CKD stage II, hypertension, dementia, who was brought to the hospital because of generalized weakness, unsteady gait and change in mental status for about 2 weeks.  The patient is unable to provide any history so history was obtained from Dennis Levine, niece, at the bedside.  She said she had noticed these changes for about 2 weeks.  She noticed that patient was dehydrated and had become weaker over the past 2 days.  His gait had become more unsteady.  Patient was taken to an urgent care clinic day prior to admission where he was found to have elevated blood pressure with diastolic in the 295A. Dennis Levine said she noticed that patient had not been taking his medicines for probably more than 2 weeks.  She also mentioned that patient started drinking more water and Ensure yesterday and this improved his mentation and generalized weakness.  She still decided to bring him to the hospital for further evaluation, as recommended by the urgent care clinic.  He was admitted to the hospital for hypernatremia, dehydration, generalized weakness and stroke, probably subacute, with hemorrhagic conversion  10/09/2021: MRI brain did not reveal brain tumor.  Neurology input is appreciated.  Pursue subacute rehab (SNF placement).  Update to patient's niece.  No new complaints.  Patient is a poor historian.  10/10/2021: Patient seen.  No new changes.  Pursue disposition.  Patient will need SNF placement.  Assessment/Plan:   Principal Problem:   CVA (cerebral vascular accident) ([facility]) Active Problems:   Hypernatremia   Generalized weakness    Probable subacute stroke with hemorrhagic conversion in the right frontal  lobe, confusion: 2D echo showed EF estimated at 50% with normal LV diastolic parameters.  EEG did not show any epileptiform activity.  MRI brain without cannot rule out underlying lesion fluids, patient could not tolerate the contrast portion of the test).  MRI brain with contrast is recommended for further evaluation.  Follow-up with neurologist for further recommendations.  Hold prophylactic Lovenox for now. 10/09/2021: Pursue disposition once cleared by neurology team.  Patient will need subacute rehab at a skilled nursing facility. 10/10/2021, pursue disposition.  Confusion with underlying dementia: It is unclear what his baseline is.  Continue supportive care. 10/09/2021: History of dementia.  Hypernatremia: -Resolved. -Sodium is 140 today.  Seizure disorder:  -Negative EEG. -Continue Keppra  Generalized weakness: PT and OT recommend discharge to SNF.  Follow-up with social worker to assist with disposition.  Other comorbidities include dementia, meningioma, history of prostate cancer, alcohol use disorder   Diet Order             Diet Heart Room service appropriate? No; Fluid consistency: Thin  Diet effective 0500                  Consultants: Neurologist  Procedures: None    Medications:    amLODipine  10 mg Oral Daily   aspirin EC  81 mg Oral Daily   atorvastatin  40 mg Oral Daily   levETIRAcetam  500 mg Oral BID   metoprolol tartrate  25 mg Oral BID   Continuous Infusions:     Anti-infectives (From admission, onward)    None  Family Communication/Anticipated D/C date and plan/Code Status   DVT prophylaxis: Place and maintain sequential compression device Start: 10/08/21 1536 SCD's Start: 10/07/21 1649     Code Status: Full Code  Family Communication: None Disposition Plan: Plan to discharge to SNF in 2 to 3 days   Status is: Inpatient Remains inpatient appropriate because: Stroke work-up, will need SNF placement  Subjective:  No new  complaints. Poor historian due to dementia. Objective:    Vitals:   10/10/21 0335 10/10/21 0722 10/10/21 1119 10/10/21 1521  BP: 115/77 125/87 134/78 119/72  Pulse: 66 68 89 92  Resp: '18 16 18 16  '$ Temp: 98.8 F (37.1 C) 98.1 F (36.7 C) 98.3 F (36.8 C) 97.6 F (36.4 C)  TempSrc: Oral   Oral  SpO2: 100% 99% (!) 89% 95%   No data found.   Intake/Output Summary (Last 24 hours) at 10/10/2021 1642 Last data filed at 10/10/2021 1200 Gross per 24 hour  Intake 480 ml  Output 0 ml  Net 480 ml    There were no vitals filed for this visit.  Exam:  GEN: Awake and alert.  Not in any distress. HEENT: No pallor or jaundice. Neck: Supple. Lungs: Clear to auscultation. CVS: S1-S2. Abdomen: Soft and nontender. Neuro: Awake and alert.  Patient moves all extremities. Extremities: No leg edema.  Data Reviewed:   I have personally reviewed following labs and imaging studies:  Labs: Labs show the following:   Basic Metabolic Panel: Recent Labs  Lab 10/07/21 1211 10/07/21 1354 10/08/21 0857  NA 146*  --  140  K 6.5* 4.5 3.7  CL 107  --  104  CO2 22  --  27  GLUCOSE 103*  --  101*  BUN 29*  --  15  CREATININE 1.11  --  0.78  CALCIUM 9.4  --  8.6*  MG  --  2.1  --     GFR CrCl cannot be calculated (Unknown ideal weight.). Liver Function Tests: Recent Labs  Lab 10/07/21 1211  AST 42*  ALT 19  ALKPHOS 59  BILITOT 1.7*  PROT 7.7  ALBUMIN 3.4*    No results for input(s): "LIPASE", "AMYLASE" in the last 168 hours. Recent Labs  Lab 10/08/21 0857  AMMONIA 18    Coagulation profile No results for input(s): "INR", "PROTIME" in the last 168 hours.  CBC: Recent Labs  Lab 10/07/21 1211 10/08/21 0857  WBC 9.7 7.8  HGB 16.5 14.1  HCT 56.5* 45.2  MCV 92.3 87.6  PLT 243 200    Cardiac Enzymes: Recent Labs  Lab 10/08/21 0857  CKTOTAL 62    BNP (last 3 results) No results for input(s): "PROBNP" in the last 8760 hours. CBG: Recent Labs  Lab  10/07/21 1224  GLUCAP 89    D-Dimer: No results for input(s): "DDIMER" in the last 72 hours. Hgb A1c: Recent Labs    10/07/21 1831  HGBA1C 6.0*    Lipid Profile: Recent Labs    10/08/21 0233  CHOL 164  HDL 40*  LDLCALC 106*  TRIG 89  CHOLHDL 4.1    Thyroid function studies: No results for input(s): "TSH", "T4TOTAL", "T3FREE", "THYROIDAB" in the last 72 hours.  Invalid input(s): "FREET3" Anemia work up: No results for input(s): "VITAMINB12", "FOLATE", "FERRITIN", "TIBC", "IRON", "RETICCTPCT" in the last 72 hours. Sepsis Labs: Recent Labs  Lab 10/07/21 1211 10/08/21 0857  WBC 9.7 7.8     Microbiology No results found for this or any previous visit (from the  past 240 hour(s)).  Procedures and diagnostic studies:     LOS: 3 days   [place] [facility]   Pager on [email or web address]. If 7PM-7AM, please contact night-coverage at [email or web address]     10/10/2021, 4:42 PM

## 2021-10-10 NOTE — NC FL2 (Signed)
[place] LEVEL OF CARE SCREENING TOOL     IDENTIFICATION  Patient Name: Dennis Levine Birthdate: 03-Sep-1950 Sex: male Admission Date (Current Location): 10/07/2021  [facility] and [place] Number:  Herbalist and Address:  [facility] [place], [place] [address] [place], [place] [address]      Provider Number: [id number]  Attending Physician Name and Address:  Bonnell Public, MD  Relative Name and Phone Number:       Current Level of Care: Hospital Recommended Level of Care: [place] Prior Approval Number:    Date Approved/Denied:   PASRR Number: [id number]  Discharge Plan: SNF    Current Diagnoses: Patient Active Problem List   Diagnosis Date Noted   CVA (cerebral vascular accident) ([place]) 10/07/2021   Hypernatremia 10/07/2021   Generalized weakness 10/07/2021   Dementia ([place]) 04/23/2021   Benign essential HTN 04/17/2021   Meningioma ([place]) 04/16/2021   Alcohol abuse 04/16/2021   SARS-CoV-2 positive 04/16/2021   CKD (chronic kidney disease), symptom management only, stage 2 (mild) 04/16/2021   Hyponatremia 04/16/2021   Prediabetes 04/16/2021   Protein-calorie malnutrition, severe 03/26/2021   Seizures ([place]) 03/23/2021   Prostate cancer ([place]) 02/03/2016    Orientation RESPIRATION BLADDER Height & Weight     Self  O2 (2L [place] as needed) External catheter Weight:   Height:     BEHAVIORAL SYMPTOMS/MOOD NEUROLOGICAL BOWEL NUTRITION STATUS      Continent    AMBULATORY STATUS COMMUNICATION OF NEEDS Skin   Limited Assist Verbally Normal                       Personal Care Assistance Level of Assistance  Bathing, Dressing, Feeding Bathing Assistance: Limited assistance Feeding assistance: Limited assistance Dressing Assistance: Limited assistance     Functional Limitations Info  Sight, Hearing, Speech Sight Info: Impaired Hearing Info: Adequate Speech Info: Adequate    SPECIAL CARE FACTORS  FREQUENCY  PT (By licensed PT), OT (By licensed OT)                    Contractures Contractures Info: Not present    Additional Factors Info  Code Status Code Status Info: FULL CODE             Current Medications (10/10/2021):  This is the current hospital active medication list Current Facility-Administered Medications  Medication Dose Route Frequency Provider Last Rate Last Admin   acetaminophen (TYLENOL) tablet 650 mg  650 mg Oral Q4H PRN Jennye Boroughs, MD       Or   acetaminophen (TYLENOL) 160 MG/5ML solution 650 mg  650 mg Per Tube Q4H PRN Jennye Boroughs, MD       Or   acetaminophen (TYLENOL) suppository 650 mg  650 mg Rectal Q4H PRN Jennye Boroughs, MD       amLODipine (NORVASC) tablet 10 mg  10 mg Oral Daily Jennye Boroughs, MD   10 mg at 10/10/21 1059   aspirin EC tablet 81 mg  81 mg Oral Daily Rosalin Hawking, MD   81 mg at 10/10/21 1059   atorvastatin (LIPITOR) tablet 40 mg  40 mg Oral Daily Janine Ores, NP   40 mg at 10/10/21 1058   levETIRAcetam (KEPPRA) tablet 500 mg  500 mg Oral BID Jennye Boroughs, MD   500 mg at 10/10/21 1059   metoprolol tartrate (LOPRESSOR) tablet 25 mg  25 mg Oral BID Jennye Boroughs, MD   25 mg  at 10/10/21 1059   senna-docusate (Senokot-S) tablet 1 tablet  1 tablet Oral QHS PRN Jennye Boroughs, MD         Discharge Medications: Please see discharge summary for a list of discharge medications.  Relevant Imaging Results:  Relevant Lab Results:   Additional Information SS# [id number]  Amador Cunas, [place]

## 2021-10-10 NOTE — TOC Initial Note (Signed)
Transition of Care [facility]) - Initial/Assessment Note    Patient Details  Name: Dennis Levine MRN: [id number] Date of Birth: Jul 24, 1950  Transition of Care [facility]) CM/SW Contact:    Amador Cunas, [place] Phone Number: 10/10/2021, 11:15 AM  Clinical Narrative:  Spoke to pt's niece, Izell [place] [phone] re PT/OT recommendation for SNF. Reviewed SNF placement process and answered questions. Pt with stay at [facility] earlier this year and niece prefers this facility again.   SNF search started, will f/u with bed offers to niece as available. [place] auth request submitted (ref# [id number]) and appropriate clinicals faxed. Will need to updated Navi with facility choice when known. SW will follow and assist as indicated.   Wandra Feinstein, MSW, LCSW [phone] (coverage)           Expected Discharge Plan: [place] Barriers to Discharge: Continued Medical Work up, Ship broker, SNF Pending bed offer   Patient Goals and CMS Choice        Expected Discharge Plan and Services Expected Discharge Plan: [place] arrangements for the past 2 months: Apartment                                      Prior Living Arrangements/Services Living arrangements for the past 2 months: Apartment Lives with:: Self, Other (Comment) (niece assists daily) Patient language and need for interpreter reviewed:: No        Need for Family Participation in Patient Care: Yes (Comment) Care giver support system in place?: Yes (comment)   Criminal Activity/Legal Involvement Pertinent to Current Situation/Hospitalization: No - Comment as needed  Activities of Daily Living      Permission Sought/Granted Permission sought to share information with : Facility Art therapist granted to share information with : Yes, Verbal Permission Granted              Emotional Assessment       Orientation: : Fluctuating  Orientation (Suspected and/or reported Sundowners) Alcohol / Substance Use: Not Applicable Psych Involvement: No (comment)  Admission diagnosis:  CVA (cerebral vascular accident) [facility]) [I63.9] Patient Active Problem List   Diagnosis Date Noted   CVA (cerebral vascular accident) ([place]) 10/07/2021   Hypernatremia 10/07/2021   Generalized weakness 10/07/2021   Dementia ([place]) 04/23/2021   Benign essential HTN 04/17/2021   Meningioma ([place]) 04/16/2021   Alcohol abuse 04/16/2021   SARS-CoV-2 positive 04/16/2021   CKD (chronic kidney disease), symptom management only, stage 2 (mild) 04/16/2021   Hyponatremia 04/16/2021   Prediabetes 04/16/2021   Protein-calorie malnutrition, severe 03/26/2021   Seizures ([place]) 03/23/2021   Prostate cancer ([place]) 02/03/2016   PCP:  Pcp, No Pharmacy:   [facility] ([place], [place] - [address] [place] [address] [place] [address] Phone: [phone] Fax: [place] ([place]), [place] - [address] [place] ([place]) [place] [address] Phone: [phone] Fax: [phone]     Social Determinants of Health (SDOH) Interventions    Readmission Risk Interventions     No data to display

## 2021-10-11 MED ORDER — SENNOSIDES-DOCUSATE SODIUM 8.6-50 MG PO TABS: 1.0000 | ORAL_TABLET | Freq: Every evening | ORAL | 0 refills | Status: AC | PRN

## 2021-10-11 MED ORDER — ASPIRIN 81 MG PO TBEC: 81.0000 mg | DELAYED_RELEASE_TABLET | Freq: Every day | ORAL | 12 refills | Status: DC

## 2021-10-11 MED ORDER — ATORVASTATIN CALCIUM 40 MG PO TABS
40.0000 mg | ORAL_TABLET | Freq: Every day | ORAL | 0 refills | Status: AC
Start: 2021-10-12 — End: 2023-02-14

## 2021-10-11 MED ORDER — ORAL CARE MOUTH RINSE: 15.0000 mL | OROMUCOSAL | 0 refills | Status: AC | PRN

## 2021-10-11 NOTE — Discharge Summary (Signed)
Physician Discharge Summary  Patient ID: Dennis Levine MRN: [id number] DOB/AGE: 12-11-1950 71 y.o.  Admit date: 10/07/2021 Discharge date: 10/11/2021  Admission Diagnoses:  Discharge Diagnoses:  Principal Problem:   CVA (cerebral vascular accident) [facility]) Active Problems:   Hypernatremia   Generalized weakness   Discharged Condition: stable  Hospital Course: Patient is a 71 year old male with past medical history significant for prostate cancer, seizure disorder, stroke, meningioma, CKD stage II, hypertension, dementia.  Patient was admitted to await generalized weakness, unsteady gait and change in mental status about 2 weeks preceding admission.  Patient was admitted for further assessment and management.  Work-up done revealed probable subacute stroke with hemorrhagic conversion.  Neurology team directed care.  Patient will be discharged to a skilled nursing facility on aspirin.  Probable subacute stroke with hemorrhagic conversion in the right frontal lobe with confusion (encephalopathy): -2D echo showed EF estimated at 50% with normal LV diastolic parameters.   -EEG did not show any epileptiform activity.   -MRI brain without cannot rule out underlying lesion fluids, patient could not tolerate the contrast portion of the test).   -MRI brain with contrast is as documented above.   -CT head, CT angio head and brain with and without contrast-as documented above. -Neurology team directed care. -Patient will be discharged on aspirin. -Patient will discharge to skilled nursing facility.    Encephalopathy/pelvis: -Patient has underlying dementia. -Patient is likely at baseline. -No behavioral problems.   -Hyponatremia has also resolved.   Hypernatremia: -Resolved.   Seizure disorder:  -Negative EEG. -Continue Keppra  Generalized weakness: PT and OT recommend discharge to SNF.    Other comorbidities include dementia, meningioma, history of prostate cancer, alcohol use  disorder   Consults: neurology  Significant Diagnostic Studies:  MRI brain without contrast revealed: "Motion degraded study. Patient could not tolerate contrast portion. Abnormal signal in the right frontal lobe favored to reflect subacute infarction with hemorrhage. However, an underlying lesion cannot be excluded on this study. As there are T1 hyperintense blood products present that would limit evaluation for enhancement, consider repeat contrast study after a few weeks unless there is a strong suspicion for malignancy.   Stable small right parietal convexity meningioma".  MRI with contrast revealed: "1. Patchy post-contrast enhancement about the previously identified area of signal abnormality involving the right frontal operculum, with additional patchy enhancement involving the mesial left temporal occipital region and right occipital lobe. These findings are felt to be most consistent with evolving subacute infarcts, right MCA distribution and bilateral PCA distributions. Associated hemorrhagic transformation at the right frontal infarct. Although malignancy is felt to be unlikely, a short interval follow-up scan to ensure no underlying lesion is present is still recommended. 2. Right parietal meningioma without significant mass effect".  CT head without contrast revealed: "3.9 x 3.8 x 4.7 cm lesion with associated mass effect within the mid right frontal lobe/frontal operculum, as described. This may reflect a subacute infarct with associated petechial hemorrhage/cortical laminar necrosis. However, a mass with internal calcification/mild hemorrhage cannot be excluded, and a brain MRI without and with contrast is recommended for further characterization.   2.0 x 1.1 cm meningioma overlying the right parietal lobe, unchanged in size from the prior brain MRI of 03/24/2021. As before, the mass contacts the underlying right parietal lobe without underlying parenchymal  edema.   Mild chronic small vessel ischemic changes within the cerebral white matter.   Redemonstrated chronic lacunar infarct within the left caudate head.   Mild-to-moderate generalized cerebral atrophy.  Comparatively mild cerebellar atrophy".    CT angio head and neck with and without contrast revealed: "CT head:   1. Similar area of abnormal edema and hemorrhage in the right anterior insula and frontal lobe, better characterized on recent MRI. 2. Additional area of hypoattenuation in the left hippocampus, mesial left temporal lobe and paramidline parietal lobe, suggestive of additional probable subacute infarcts. 3. The above areas demonstrate ill-defined enhancement on the CTA, possibly representing enhancing subacute infarcts. As noted on previous MRI, follow-up MRI with contrast is recommended to exclude underlying mass lesions. 4. Similar probable meningioma along the right parietal convexity, better characterized on recent MRI.   CTA:   1. 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 2. Similar moderate bilateral intracranial ICA and moderate left M2 MCA stenosis. 3. Bilateral carotid bifurcation atherosclerosis with approximately 20-30% right proximal ICA stenosis".   Discharge Exam: Blood pressure 120/76, pulse 69, temperature 98.2 F (36.8 C), temperature source Oral, resp. rate 17, SpO2 100 %.   Disposition: Discharge disposition: 03-Skilled Nursing Facility       Discharge Instructions     Diet - low sodium heart healthy   Complete by: As directed    Increase activity slowly   Complete by: As directed       Allergies as of 10/11/2021   No Known Allergies      Medication List     STOP taking these medications    acetaminophen 325 MG tablet Commonly known as: TYLENOL   thiamine 100 MG tablet       TAKE these medications    amLODipine 10 MG  tablet Commonly known as: NORVASC Take 1 tablet (10 mg total) by mouth daily.   aspirin EC 81 MG tablet Take 1 tablet (81 mg total) by mouth daily. Swallow whole. Start taking on: October 12, 2021   atorvastatin 40 MG tablet Commonly known as: LIPITOR Take 1 tablet (40 mg total) by mouth daily. Start taking on: October 12, 2021   feeding supplement Liqd Take 237 mLs by mouth 3 (three) times daily between meals.   folic acid 1 MG tablet Commonly known as: FOLVITE Take 1 tablet (1 mg total) by mouth daily.   levETIRAcetam 500 MG tablet Commonly known as: KEPPRA Take 1 tablet (500 mg total) by mouth 2 (two) times daily.   metoprolol tartrate 25 MG tablet Commonly known as: LOPRESSOR Take 1 tablet (25 mg total) by mouth 2 (two) times daily.   mouth rinse Liqd solution 15 mLs by Mouth Rinse route as needed (for oral care).   multivitamin with minerals Tabs tablet Take 1 tablet by mouth daily.   polyethylene glycol 17 g packet Commonly known as: MIRALAX / GLYCOLAX Take 17 g by mouth daily as needed for moderate constipation.   senna-docusate 8.6-50 MG tablet Commonly known as: Senokot-S Take 1 tablet by mouth at bedtime as needed for mild constipation.        Contact information for after-discharge care     Destination     [facility] Preferred SNF .   Service: Skilled Nursing Contact information: [address] [place] [phone]                     Signed: Bonnell Public 10/11/2021, 12:53 PM

## 2021-10-11 NOTE — Progress Notes (Signed)
Report called to Moore at [facility]. All questions answered. Iv removed and patient belongings and discharge packet sent with PTAR for receiving facility. Patient niece notified.  Gwendolyn Grant, RN

## 2021-10-11 NOTE — TOC Progression Note (Signed)
Transition of Care [facility]) - Progression Note    Patient Details  Name: Dennis Levine MRN: [id number] Date of Birth: November 13, 1950  Transition of Care [facility]) CM/SW [place], [place] Phone Number: 10/11/2021, 11:04 AM  Clinical Narrative:    CSW followed up with family about bed choice and they have chosen [facility]. [facility] stated they have a bed today if authorization is approved. CSW updated facility with Navi, authorization is still pending at this time. TOC will continue to follow for DC needs.   Expected Discharge Plan: [place] Barriers to Discharge: Continued Medical Work up, Ship broker, SNF Pending bed offer  Expected Discharge Plan and Services Expected Discharge Plan: [place] arrangements for the past 2 months: Apartment                                       Social Determinants of Health (SDOH) Interventions    Readmission Risk Interventions     No data to display

## 2021-10-11 NOTE — TOC Transition Note (Signed)
Transition of Care [facility]) - CM/SW Discharge Note   Patient Details  Name: Dennis Levine MRN: [id number] Date of Birth: 1951/01/27  Transition of [facility]) CM/SW Contact:  Coralee Pesa, [place] Phone Number: 10/11/2021, 1:48 PM   Clinical Narrative:    Pt to be transported to [facility] via [facility]. Nurse to call report to [phone] Rm# [phone]   Final next level of care: Skilled Nursing Facility Barriers to Discharge: Barriers Resolved   Patient Goals and CMS Choice        Discharge Placement              Patient [facility] bed at[facility] Patient to be transferred to facility by: [place] Name of family member notified: Teisha Patient and family notified of of transfer: 10/11/21  Discharge Plan and Services                                     Social Determinants of Health (SDOH) Interventions     Readmission Risk Interventions     No data to display

## 2021-10-11 NOTE — Progress Notes (Signed)
Physical Therapy Treatment Patient Details Name: Dennis Levine MRN: [id number] DOB: [birth year] Today's Date: 10/11/2021   History of Present Illness Pt is a 71 y/o male presenting on 6/29 with weakness and AMS. MRI with R fontal lobe subacute infarct with hemorrhagic conversion. PMH includes: CVA, dementia, meningioma, CKD II, HTN, prostate cancer, seizures, hepatitis.    PT Comments    Pt progressing towards all goals. Pt remains to have cognitive deficits, impaired balance, increased falls risk, and requiring minA and RW for safe ambulation. Acute PT to continue to follow.   Recommendations for follow up therapy are one component of a multi-disciplinary discharge planning process, led by the attending physician.  Recommendations may be updated based on patient status, additional functional criteria and insurance authorization.  Follow Up Recommendations  Skilled nursing-short term rehab (<3 hours/day) Can patient physically be transported by private vehicle: Yes   Assistance Recommended at Discharge Frequent or constant Supervision/Assistance  Patient can return home with the following A little help with walking and/or transfers;Assistance with cooking/housework;Direct supervision/assist for medications management;Assist for transportation;A lot of help with bathing/dressing/bathroom;Direct supervision/assist for financial management;Help with stairs or ramp for entrance   Equipment Recommendations  Rolling walker (2 wheels)    Recommendations for Other Services       Precautions / Restrictions Precautions Precautions: Fall Restrictions Weight Bearing Restrictions: No     Mobility  Bed Mobility Overal bed mobility: Needs Assistance Bed Mobility: Supine to Sit     Supine to sit: Min assist, HOB elevated     General bed mobility comments: use of railing, HOB elevated, increased time    Transfers Overall transfer level: Needs assistance Equipment used: 1 person hand  held assist Transfers: Sit to/from Stand, Bed to chair/wheelchair/BSC Sit to Stand: Min assist           General transfer comment: verbal cues for hand placement, minA to power up and steady    Ambulation/Gait Ambulation/Gait assistance: Min assist, +2 safety/equipment, Mod assist Gait Distance (Feet): 75 Feet (x1, 60x1) Assistive device: Rolling walker (2 wheels) Gait Pattern/deviations: Step-through pattern, Decreased stride length, Narrow base of support Gait velocity: slow Gait velocity interpretation: <1.31 ft/sec, indicative of household ambulator   General Gait Details: pt initially very quick requiring verbal cues to slow down, pt with constant vearing to the R requiring verbal cues to correct, pt with very narrow base of support with short step height and length, unsteady   Stairs             Wheelchair Mobility    Modified Rankin (Stroke Patients Only) Modified Rankin (Stroke Patients Only) Pre-Morbid Rankin Score: Moderate disability Modified Rankin: Moderately severe disability     Balance Overall balance assessment: Needs assistance Sitting-balance support: Bilateral upper extremity supported, Feet supported Sitting balance-Leahy Scale: Fair Sitting balance - Comments: able to reach feet dynamically with min guard   Standing balance support: Bilateral upper extremity supported, During functional activity Standing balance-Leahy Scale: Poor Standing balance comment: relies on external support                            Cognition Arousal/Alertness: Awake/alert Behavior During Therapy: Flat affect (but conversant) Overall Cognitive Status: No family/caregiver present to determine baseline cognitive functioning                   Orientation Level: Disoriented to, Place, Time, Situation Current Attention Level: Sustained Memory: Decreased short-term memory Following Commands: Follows one  step commands with increased  time Safety/Judgement: Decreased awareness of safety, Decreased awareness of deficits Awareness: Intellectual Problem Solving: Slow processing, Decreased initiation, Difficulty sequencing, Requires verbal cues General Comments: suspect pt with cognitive impairments at baseline. pt with difficulty sequencing, delayed processing, and STM deficits however very pleasant and cooperative        Exercises      General Comments General comments (skin integrity, edema, etc.): VSS      Pertinent Vitals/Pain Pain Assessment Pain Assessment: Faces Faces Pain Scale: No hurt    Home Living                          Prior Function            PT Goals (current goals can now be found in the care plan section) Progress towards PT goals: Progressing toward goals    Frequency    Min 3X/week      PT Plan Current plan remains appropriate    Co-evaluation              AM-PAC PT "6 Clicks" Mobility   Outcome Measure  Help needed turning from your back to your side while in a flat bed without using bedrails?: A Little Help needed moving from lying on your back to sitting on the side of a flat bed without using bedrails?: A Lot Help needed moving to and from a bed to a chair (including a wheelchair)?: A Little Help needed standing up from a chair using your arms (e.g., wheelchair or bedside chair)?: A Lot Help needed to walk in hospital room?: A Lot Help needed climbing 3-5 steps with a railing? : Total 6 Click Score: 13    End of Session Equipment Utilized During Treatment: Gait belt Activity Tolerance: Patient tolerated treatment well Patient left: in chair;with call bell/phone within reach;with chair alarm set Nurse Communication: Mobility status PT Visit Diagnosis: Other abnormalities of gait and mobility (R26.89);Muscle weakness (generalized) (M62.81)     Time: 8250-0370 PT Time Calculation (min) (ACUTE ONLY): 23 min  Charges:  $Gait Training: 23-37  mins                     Kittie Plater, PT, DPT Acute Rehabilitation Services Secure chat preferred Office #: [phone]    Berline Lopes 10/11/2021, 1:24 PM

## 2021-11-10 ENCOUNTER — Ambulatory Visit: Payer: Medicare Other | Admitting: Family Medicine

## 2021-11-15 ENCOUNTER — Emergency Department (HOSPITAL_COMMUNITY): Payer: Medicare Other

## 2021-11-15 ENCOUNTER — Other Ambulatory Visit: Payer: Self-pay

## 2021-11-15 ENCOUNTER — Emergency Department (HOSPITAL_COMMUNITY)
Admission: EM | Admit: 2021-11-15 | Discharge: 2021-11-16 | Disposition: A | Discharge status: Home / Self Care | Payer: Medicare Other | Attending: Emergency Medicine | Admitting: Emergency Medicine

## 2021-11-15 DIAGNOSIS — N390 Urinary tract infection, site not specified: Secondary | ICD-10-CM

## 2021-11-15 DIAGNOSIS — Z7982 Long term (current) use of aspirin: Secondary | ICD-10-CM | POA: Diagnosis not present

## 2021-11-15 DIAGNOSIS — I129 Hypertensive chronic kidney disease with stage 1 through stage 4 chronic kidney disease, or unspecified chronic kidney disease: Secondary | ICD-10-CM | POA: Diagnosis not present

## 2021-11-15 DIAGNOSIS — X58XXXA Exposure to other specified factors, initial encounter: Secondary | ICD-10-CM | POA: Diagnosis not present

## 2021-11-15 DIAGNOSIS — R531 Weakness: Secondary | ICD-10-CM | POA: Diagnosis not present

## 2021-11-15 DIAGNOSIS — Z79899 Other long term (current) drug therapy: Secondary | ICD-10-CM | POA: Diagnosis not present

## 2021-11-15 DIAGNOSIS — Z8546 Personal history of malignant neoplasm of prostate: Secondary | ICD-10-CM | POA: Diagnosis not present

## 2021-11-15 DIAGNOSIS — N182 Chronic kidney disease, stage 2 (mild): Secondary | ICD-10-CM | POA: Diagnosis not present

## 2021-11-15 DIAGNOSIS — Z86011 Personal history of benign neoplasm of the brain: Secondary | ICD-10-CM | POA: Insufficient documentation

## 2021-11-15 DIAGNOSIS — F0390 Unspecified dementia without behavioral disturbance: Secondary | ICD-10-CM | POA: Insufficient documentation

## 2021-11-15 DIAGNOSIS — S92512A Displaced fracture of proximal phalanx of left lesser toe(s), initial encounter for closed fracture: Secondary | ICD-10-CM | POA: Insufficient documentation

## 2021-11-15 DIAGNOSIS — S92502A Displaced unspecified fracture of left lesser toe(s), initial encounter for closed fracture: Secondary | ICD-10-CM

## 2021-11-15 DIAGNOSIS — S99922A Unspecified injury of left foot, initial encounter: Secondary | ICD-10-CM | POA: Diagnosis present

## 2021-11-15 LAB — CBC WITH DIFFERENTIAL/PLATELET
Abs Immature Granulocytes: 0.02 10*3/uL (ref 0.00–0.07)
Basophils Absolute: 0 10*3/uL (ref 0.0–0.1)
Basophils Relative: 1 %
Eosinophils Absolute: 0.2 10*3/uL (ref 0.0–0.5)
Eosinophils Relative: 3 %
HCT: 39 % (ref 39.0–52.0)
Hemoglobin: 12.1 g/dL — ABNORMAL LOW (ref 13.0–17.0)
Immature Granulocytes: 0 %
Lymphocytes Relative: 21 %
Lymphs Abs: 1.2 10*3/uL (ref 0.7–4.0)
MCH: 27.1 pg (ref 26.0–34.0)
MCHC: 31 g/dL (ref 30.0–36.0)
MCV: 87.4 fL (ref 80.0–100.0)
Monocytes Absolute: 1.1 10*3/uL — ABNORMAL HIGH (ref 0.1–1.0)
Monocytes Relative: 19 %
Neutro Abs: 3.2 10*3/uL (ref 1.7–7.7)
Neutrophils Relative %: 56 %
Platelets: 187 10*3/uL (ref 150–400)
RBC: 4.46 MIL/uL (ref 4.22–5.81)
RDW: 15.3 % (ref 11.5–15.5)
WBC: 5.8 10*3/uL (ref 4.0–10.5)
nRBC: 0 % (ref 0.0–0.2)

## 2021-11-15 LAB — COMPREHENSIVE METABOLIC PANEL
ALT: 47 U/L — ABNORMAL HIGH (ref 0–44)
AST: 96 U/L — ABNORMAL HIGH (ref 15–41)
Albumin: 3.3 g/dL — ABNORMAL LOW (ref 3.5–5.0)
Alkaline Phosphatase: 65 U/L (ref 38–126)
Anion gap: 8 (ref 5–15)
BUN: 33 mg/dL — ABNORMAL HIGH (ref 8–23)
CO2: 26 mmol/L (ref 22–32)
Calcium: 9.3 mg/dL (ref 8.9–10.3)
Chloride: 105 mmol/L (ref 98–111)
Creatinine, Ser: 0.91 mg/dL (ref 0.61–1.24)
GFR, Estimated: >60 mL/min (ref >60)
Glucose, Bld: 104 mg/dL — ABNORMAL HIGH (ref 70–99)
Potassium: 4.2 mmol/L (ref 3.5–5.1)
Sodium: 139 mmol/L (ref 135–145)
Total Bilirubin: 0.9 mg/dL (ref 0.3–1.2)
Total Protein: 6.7 g/dL (ref 6.5–8.1)

## 2021-11-15 LAB — URINALYSIS, ROUTINE W REFLEX MICROSCOPIC
Bilirubin Urine: NEGATIVE
Glucose, UA: NEGATIVE mg/dL
Hgb urine dipstick: NEGATIVE
Ketones, ur: NEGATIVE mg/dL
Nitrite: NEGATIVE
Protein, ur: NEGATIVE mg/dL
Specific Gravity, Urine: 1.02 (ref 1.005–1.030)
pH: 5 (ref 5.0–8.0)

## 2021-11-15 LAB — TROPONIN I (HIGH SENSITIVITY): Troponin I (High Sensitivity): 15 ng/L (ref <18)

## 2021-11-15 MED ORDER — CEPHALEXIN 500 MG PO CAPS: 500.0000 mg | ORAL_CAPSULE | Freq: Three times a day (TID) | ORAL | 0 refills | Status: AC

## 2021-11-15 NOTE — ED Notes (Signed)
Pt return from [place] and X Ray

## 2021-11-15 NOTE — ED Notes (Signed)
Patient given something to eat and drink

## 2021-11-15 NOTE — ED Notes (Signed)
Patient is resting in bed. Occasionally he groans loudly, but falls back to sleep. When asked if he needed anything, that says he is doing fine.

## 2021-11-15 NOTE — ED Provider Notes (Signed)
[facility] EMERGENCY DEPARTMENT Provider Note   CSN: [id number] Arrival date & time: 11/15/21  1657     History  Chief Complaint  Patient presents with   Weakness   Foot Pain    Dennis Levine is a 71 y.o. male.  Presented to ER due to concern for generalized weakness.  Apparently patient lives alone but has someone that checks on him periodically.  Concerned that he was having increased generalized weakness and difficulty walking.  Patient states that he is feeling fine today and does not have any medical complaints.  He said that he was unaware of his issues walking.  He did have some pain and slight swelling to his left foot though he does not have any pain there right now.  He does not recall any trauma.  Reviewed chart, obtained additional history, admission 1 month ago - 71 year old male with past medical history significant for prostate cancer, seizure disorder, stroke, meningioma, CKD stage II, hypertension, dementia.  Patient was admitted to await generalized weakness, unsteady gait and change in mental status about 2 weeks preceding admission.  Patient was admitted for further assessment and management.  Work-up done revealed probable subacute stroke with hemorrhagic conversion.   HPI     Home Medications Prior to Admission medications   Medication Sig Start Date End Date Taking? Authorizing Provider  cephALEXin (KEFLEX) 500 MG capsule Take 1 capsule (500 mg total) by mouth 3 (three) times daily for 7 days. 11/15/21 11/22/21 Yes Fate Caster, Ellwood Dense, MD  amLODipine (NORVASC) 10 MG tablet Take 1 tablet (10 mg total) by mouth daily. 04/22/21   Medina-Vargas, Monina C, NP  aspirin EC 81 MG tablet Take 1 tablet (81 mg total) by mouth daily. Swallow whole. 10/12/21   Bonnell Public, MD  atorvastatin (LIPITOR) 40 MG tablet Take 1 tablet (40 mg total) by mouth daily. 10/12/21 11/11/21  Bonnell Public, MD  feeding supplement (ENSURE ENLIVE / ENSURE PLUS) LIQD Take  237 mLs by mouth 3 (three) times daily between meals. 04/13/21   Elgergawy, Silver Huguenin, MD  folic acid (FOLVITE) 1 MG tablet Take 1 tablet (1 mg total) by mouth daily. 04/22/21   Medina-Vargas, Monina C, NP  levETIRAcetam (KEPPRA) 500 MG tablet Take 1 tablet (500 mg total) by mouth 2 (two) times daily. 04/22/21   Medina-Vargas, Monina C, NP  metoprolol tartrate (LOPRESSOR) 25 MG tablet Take 1 tablet (25 mg total) by mouth 2 (two) times daily. Patient not taking: Reported on 10/07/2021 04/22/21   Medina-Vargas, Monina C, NP  Multiple Vitamin (MULTIVITAMIN WITH MINERALS) TABS tablet Take 1 tablet by mouth daily. 04/13/21   Elgergawy, Silver Huguenin, MD  polyethylene glycol (MIRALAX / GLYCOLAX) 17 g packet Take 17 g by mouth daily as needed for moderate constipation. 03/30/21   Shelly Coss, MD      Allergies    Patient has no known allergies.    Review of Systems   Review of Systems  Constitutional:  Negative for chills and fever.  HENT:  Negative for ear pain and sore throat.   Eyes:  Negative for pain and visual disturbance.  Respiratory:  Negative for cough and shortness of breath.   Cardiovascular:  Negative for chest pain and palpitations.  Gastrointestinal:  Negative for abdominal pain and vomiting.  Genitourinary:  Negative for dysuria and hematuria.  Musculoskeletal:  Positive for arthralgias. Negative for back pain.  Skin:  Negative for color change and rash.  Neurological:  Negative for seizures  and syncope.  All other systems reviewed and are negative.   Physical Exam Updated Vital Signs BP 110/72   Pulse 88   Temp 98.9 F (37.2 C)   Resp 17   Ht '5\' 11"'$  (1.803 m)   Wt 54.4 kg   SpO2 98%   BMI 16.74 kg/m  Physical Exam Vitals and nursing note reviewed.  Constitutional:      General: He is not in acute distress.    Appearance: He is well-developed.  HENT:     Head: Normocephalic and atraumatic.  Eyes:     Conjunctiva/sclera: Conjunctivae normal.  Cardiovascular:     Rate  and Rhythm: Normal rate and regular rhythm.     Heart sounds: No murmur heard. Pulmonary:     Effort: Pulmonary effort is normal. No respiratory distress.     Breath sounds: Normal breath sounds.  Abdominal:     Palpations: Abdomen is soft.     Tenderness: There is no abdominal tenderness.  Musculoskeletal:        General: No swelling, deformity or signs of injury.     Cervical back: Neck supple.     Comments: Slight swelling noted in the left foot but no tenderness to palpation noted  Skin:    General: Skin is warm and dry.     Capillary Refill: Capillary refill takes less than 2 seconds.  Neurological:     Mental Status: He is alert.     Comments: Alert, oriented to person, place but not time  Psychiatric:        Mood and Affect: Mood normal.     ED Results / Procedures / Treatments   Labs (all labs ordered are listed, but only abnormal results are displayed) Labs Reviewed  CBC WITH DIFFERENTIAL/PLATELET - Abnormal; Notable for the following components:      Result Value   Hemoglobin 12.1 (*)    Monocytes Absolute 1.1 (*)    All other components within normal limits  COMPREHENSIVE METABOLIC PANEL - Abnormal; Notable for the following components:   Glucose, Bld 104 (*)    BUN 33 (*)    Albumin 3.3 (*)    AST 96 (*)    ALT 47 (*)    All other components within normal limits  URINALYSIS, ROUTINE W REFLEX MICROSCOPIC - Abnormal; Notable for the following components:   Leukocytes,Ua TRACE (*)    Bacteria, UA MANY (*)    All other components within normal limits  URINE CULTURE  TROPONIN I (HIGH SENSITIVITY)    EKG EKG Interpretation  Date/Time:  Monday November 15 2021 18:11:14 EDT Ventricular Rate:  65 PR Interval:  156 QRS Duration: 78 QT Interval:  458 QTC Calculation: 476 R Axis:   59 Text Interpretation: Normal sinus rhythm Cannot rule out Anterior infarct , age undetermined Abnormal ECG When compared with ECG of 07-Oct-2021 15:23,  t wave inversions have  improved Confirmed by Madalyn Rob [phone]) on 11/15/2021 6:49:26 PM  Radiology CT Head Wo Contrast  Result Date: 11/15/2021 CLINICAL DATA:  Delirium EXAM: CT HEAD WITHOUT CONTRAST TECHNIQUE: Contiguous axial images were obtained from the base of the skull through the vertex without intravenous contrast. RADIATION DOSE REDUCTION: This exam was performed according to the departmental dose-optimization program which includes automated exposure control, adjustment of the mA and/or kV according to patient size and/or use of iterative reconstruction technique. COMPARISON:  10/08/2021 FINDINGS: Brain: Evolution of the right MCA and left PCA territory infarcts seen on prior MRI and CT. No  evidence of hemorrhagic transformation. No evidence of acute infarct or hemorrhage. The lateral ventricles and midline structures are stable. No acute extra-axial fluid collections. No mass effect. Vascular: Stable atherosclerosis.  No hyperdense vessel. Skull: Normal. Negative for fracture or focal lesion. Sinuses/Orbits: No acute finding. Large amount of cerumen again seen within the bilateral external auditory canals. Other: None. IMPRESSION: 1. Chronic ischemic changes within the right MCA and left PCA territories, with continued evolution since prior MRI. No evidence of hemorrhagic transformation. 2. No acute intracranial process. Electronically Signed   By: Randa Ngo M.D.   On: 11/15/2021 19:23   DG Pelvis 1-2 Views  Result Date: 11/15/2021 CLINICAL DATA:  Weakness. EXAM: PELVIS - 1-2 VIEW COMPARISON:  None Available. FINDINGS: The bones are under mineralized. The cortical margins of the bony pelvis are intact. No fracture. Pubic symphysis and sacroiliac joints are congruent. Both femoral heads are well-seated in the respective acetabula. Prominent vascular calcifications. IMPRESSION: No acute finding. Electronically Signed   By: Keith Rake M.D.   On: 11/15/2021 19:04   DG Chest 2 View  Result Date:  11/15/2021 CLINICAL DATA:  Weakness. EXAM: CHEST - 2 VIEW COMPARISON:  03/31/2021 FINDINGS: The cardiomediastinal contours are normal. Mild chronic elevation of right hemidiaphragm. Pulmonary vasculature is normal. No consolidation, pleural effusion, or pneumothorax. T12 compression deformity, age indeterminate. IMPRESSION: 1. No acute chest findings. 2. Mild T12 compression deformity, age indeterminate. Electronically Signed   By: Keith Rake M.D.   On: 11/15/2021 19:03   DG Foot Complete Left  Result Date: 11/15/2021 CLINICAL DATA:  Foot pain and weakness.  Left foot pain.  Swelling. EXAM: LEFT FOOT - COMPLETE 3+ VIEW COMPARISON:  None Available. FINDINGS: Minimally displaced fracture of the fifth digit proximal phalanx. No convincing intra-articular extension. No other fracture of the foot. Normal alignment. No erosion, periosteal reaction, or bone destruction. There are vascular calcifications. Dorsal soft tissue edema. IMPRESSION: 1. Minimally displaced fracture of the fifth digit proximal phalanx. 2. Dorsal soft tissue edema. Electronically Signed   By: Keith Rake M.D.   On: 11/15/2021 19:00    Procedures Procedures    Medications Ordered in ED Medications - No data to display  ED Course/ Medical Decision Making/ A&P                           Medical Decision Making Amount and/or Complexity of Data Reviewed Labs: ordered. Radiology: ordered.  Risk Prescription drug management.   71 year old male presents to ER due to concern for generalized weakness.  Patient currently denies any acute complaints.  Notable recent admission for stroke with hemorrhagic conversion.  No focal neuro complaint or deficit.  Well-appearing.  Vital stable.  Will obtain broad workup, repeat head CT and check labs and urinalysis.  CT of his head is negative, no acute findings.  He does have minimally displaced fracture of the fifth toe.  Will place in postop shoe for now and advised follow-up with Ortho.   Patient was able to ambulate with walker in ER. UA with possible UTI though not convincing. Out of an abundance of precaution, will cover with course of abx.  Discussed with niece listed as emergency contact - she was interested in having patient go back to rehab center. Given patient able to ambulate independently, tolerating PO, well appearing, feel is stable to discharge. I have consulted case management and asked them to review what options pt may have and ensure he has maximum  home health resources.   After the discussed management above, the patient was determined to be safe for discharge.  The patient was in agreement with this plan and all questions regarding their care were answered.  ED return precautions were discussed and the patient will return to the ED with any significant worsening of condition.        Final Clinical Impression(s) / ED Diagnoses Final diagnoses:  Generalized weakness  Urinary tract infection without hematuria, site unspecified  Closed fracture of phalanx of left fifth toe, initial encounter    Rx / DC Orders ED Discharge Orders          Ordered    cephALEXin (KEFLEX) 500 MG capsule  3 times daily        11/15/21 2238              Lucrezia Starch, MD 11/15/21 2305

## 2021-11-15 NOTE — ED Notes (Signed)
Called ortho for post op shoe.

## 2021-11-15 NOTE — Care Management (Signed)
RN Case Manager spoke with niece Izell [place]  [phone]  She refuses to come out tonight to pick him up and she has the keys to his home.  She has agreed to go to his home at 7:30 to let him in.  The secretary will schedule a PTAR pick up for 7:30 to get him home. Updated Bobbye Charleston RN

## 2021-11-15 NOTE — ED Notes (Signed)
Per Social work, niece is unable to get the patient and will not able to meet the patient at his home until morning. Patient will go home at 730am via PTAR and the niece will meet them.

## 2021-11-15 NOTE — ED Notes (Signed)
Patient is unable to void right ow. Will try again later Patient is asking for something to eat.

## 2021-11-15 NOTE — ED Notes (Signed)
Mr. Mcmanaman is AO to self and where he is located. But unsure of time and why he is here at the hospital.

## 2021-11-15 NOTE — ED Notes (Signed)
Pt steady with walker while ambulated

## 2021-11-15 NOTE — ED Triage Notes (Signed)
Lives alone; initial call was for a swollen left foot. Dennis Levine was unable to walk to the door, unable to stand up with out 2 person assistance. Had a strong pungent urine odor about him. HX of CVA has a person that comes around to check on him.

## 2021-11-15 NOTE — Progress Notes (Signed)
Orthopedic Tech Progress Note Patient Details:  CARLESS SLATTEN 1950/11/26 [id number]  Ortho Devices Type of Ortho Device: Postop shoe/boot Ortho Device/Splint Location: lle Ortho Device/Splint Interventions: Ordered, Adjustment, Application   Post Interventions Patient Tolerated: Well Instructions Provided: Care of device, Adjustment of device  Karolee Stamps 11/15/2021, 10:00 PM

## 2021-11-15 NOTE — ED Notes (Signed)
Patient transported to X-ray 

## 2021-11-15 NOTE — Discharge Instructions (Addendum)
Please follow-up with primary care doctor.  Take antibiotic as prescribed for possible urinary tract infection.  Come back to ER as needed for any additional falls, increasing weakness or fever or other new concerning symptoms.  The x-ray showed that he has a slight fracture in his left small toe, for now use the hard soled shoe for extra support and follow-up with an orthopedic doctor.

## 2021-11-16 DIAGNOSIS — S92512A Displaced fracture of proximal phalanx of left lesser toe(s), initial encounter for closed fracture: Secondary | ICD-10-CM | POA: Diagnosis not present

## 2021-11-16 MED ORDER — FOLIC ACID 1 MG PO TABS: 1.0000 mg | ORAL_TABLET | Freq: Every day | ORAL | Status: DC

## 2021-11-16 MED ORDER — AMLODIPINE BESYLATE 5 MG PO TABS: 10.0000 mg | ORAL_TABLET | Freq: Every day | ORAL | Status: DC

## 2021-11-16 MED ORDER — LEVETIRACETAM 500 MG PO TABS
500.0000 mg | ORAL_TABLET | Freq: Two times a day (BID) | ORAL | Status: DC
  Administered 2021-11-16: 500 mg via ORAL
  Filled 2021-11-16: qty 1

## 2021-11-16 MED ORDER — METOPROLOL TARTRATE 25 MG PO TABS
25.0000 mg | ORAL_TABLET | Freq: Two times a day (BID) | ORAL | Status: DC
Start: 2021-11-16 — End: 2021-11-16

## 2021-11-16 MED ORDER — ASPIRIN 81 MG PO TBEC: 81.0000 mg | DELAYED_RELEASE_TABLET | Freq: Every day | ORAL | Status: DC

## 2021-11-16 NOTE — ED Notes (Signed)
Patient given a snack and something to drink

## 2021-11-16 NOTE — ED Notes (Signed)
Patient ambulated to the bathroom using a walker, with assistance

## 2021-11-16 NOTE — ED Notes (Signed)
Patient is asleep. Called Alethia Berthold, niece, to remind her that the transport team will be here at 730am to pick up the patient. She will need to meet him at his home address with the keys.

## 2021-11-18 LAB — URINE CULTURE: Culture: >=100000 — AB

## 2021-11-19 ENCOUNTER — Telehealth: Payer: Self-pay

## 2021-11-19 NOTE — Telephone Encounter (Signed)
Post ED Visit - Positive Culture Follow-up  Culture report reviewed by antimicrobial stewardship pharmacist: [place] [facility] '[x]'$  [address], [place].D. '[]'$  Heide Guile, Pharm.D., BCPS AQ-ID '[]'$  Parks Neptune, Pharm.D., BCPS '[]'$  Alycia Rossetti, Pharm.D., BCPS '[]'$  [place], Pharm.D., BCPS, AAHIVP '[]'$  Legrand Como, Pharm.D., BCPS, AAHIVP '[]'$  Salome Arnt, PharmD, BCPS '[]'$  Johnnette Gourd, PharmD, BCPS '[]'$  Hughes Better, PharmD, BCPS '[]'$  Leeroy Cha, PharmD '[]'$  Laqueta Linden, PharmD, BCPS '[]'$  Albertina Parr, PharmD  [place] Team '[]'$  Leodis Sias, PharmD '[]'$  Lindell Spar, PharmD '[]'$  Royetta Asal, PharmD '[]'$  Graylin Shiver, Rph '[]'$  Rema Fendt) Glennon Mac, PharmD '[]'$  Arlyn Dunning, PharmD '[]'$  Netta Cedars, PharmD '[]'$  Dia Sitter, PharmD '[]'$  Leone Haven, PharmD '[]'$  Gretta Arab, PharmD '[]'$  Theodis Shove, PharmD '[]'$  Peggyann Juba, PharmD '[]'$  Reuel Boom, PharmD   Positive urine culture Treated with Cephalexin, organism sensitive to the same and no further patient follow-up is required at this time.  Glennon Hamilton 11/19/2021, 12:15 PM

## 2021-12-05 ENCOUNTER — Other Ambulatory Visit: Payer: Self-pay

## 2021-12-05 ENCOUNTER — Emergency Department (HOSPITAL_COMMUNITY): Payer: Medicare Other

## 2021-12-05 ENCOUNTER — Emergency Department (HOSPITAL_COMMUNITY)
Admission: EM | Admit: 2021-12-05 | Discharge: 2021-12-07 | Disposition: A | Discharge status: Skilled Nursing Facility | Payer: Medicare Other | Attending: Emergency Medicine | Admitting: Emergency Medicine

## 2021-12-05 ENCOUNTER — Encounter (HOSPITAL_COMMUNITY): Payer: Self-pay

## 2021-12-05 DIAGNOSIS — Z9183 Wandering in diseases classified elsewhere: Secondary | ICD-10-CM | POA: Insufficient documentation

## 2021-12-05 DIAGNOSIS — R2681 Unsteadiness on feet: Secondary | ICD-10-CM | POA: Diagnosis not present

## 2021-12-05 DIAGNOSIS — R531 Weakness: Secondary | ICD-10-CM

## 2021-12-05 DIAGNOSIS — R4689 Other symptoms and signs involving appearance and behavior: Secondary | ICD-10-CM

## 2021-12-05 DIAGNOSIS — M6281 Muscle weakness (generalized): Secondary | ICD-10-CM | POA: Diagnosis not present

## 2021-12-05 DIAGNOSIS — N189 Chronic kidney disease, unspecified: Secondary | ICD-10-CM | POA: Diagnosis not present

## 2021-12-05 DIAGNOSIS — Z853 Personal history of malignant neoplasm of breast: Secondary | ICD-10-CM | POA: Insufficient documentation

## 2021-12-05 DIAGNOSIS — Z7982 Long term (current) use of aspirin: Secondary | ICD-10-CM | POA: Diagnosis not present

## 2021-12-05 DIAGNOSIS — R410 Disorientation, unspecified: Secondary | ICD-10-CM | POA: Insufficient documentation

## 2021-12-05 DIAGNOSIS — R630 Anorexia: Secondary | ICD-10-CM | POA: Insufficient documentation

## 2021-12-05 DIAGNOSIS — F03A0 Unspecified dementia, mild, without behavioral disturbance, psychotic disturbance, mood disturbance, and anxiety: Secondary | ICD-10-CM

## 2021-12-05 DIAGNOSIS — I129 Hypertensive chronic kidney disease with stage 1 through stage 4 chronic kidney disease, or unspecified chronic kidney disease: Secondary | ICD-10-CM | POA: Insufficient documentation

## 2021-12-05 LAB — CBC WITH DIFFERENTIAL/PLATELET
Abs Immature Granulocytes: 0.02 10*3/uL (ref 0.00–0.07)
Basophils Absolute: 0 10*3/uL (ref 0.0–0.1)
Basophils Relative: 0 %
Eosinophils Absolute: 0.1 10*3/uL (ref 0.0–0.5)
Eosinophils Relative: 3 %
HCT: 40 % (ref 39.0–52.0)
Hemoglobin: 12.1 g/dL — ABNORMAL LOW (ref 13.0–17.0)
Immature Granulocytes: 0 %
Lymphocytes Relative: 40 %
Lymphs Abs: 2 10*3/uL (ref 0.7–4.0)
MCH: 27.1 pg (ref 26.0–34.0)
MCHC: 30.3 g/dL (ref 30.0–36.0)
MCV: 89.5 fL (ref 80.0–100.0)
Monocytes Absolute: 0.7 10*3/uL (ref 0.1–1.0)
Monocytes Relative: 15 %
Neutro Abs: 2 10*3/uL (ref 1.7–7.7)
Neutrophils Relative %: 42 %
Platelets: 236 10*3/uL (ref 150–400)
RBC: 4.47 MIL/uL (ref 4.22–5.81)
RDW: 14.7 % (ref 11.5–15.5)
WBC: 4.9 10*3/uL (ref 4.0–10.5)
nRBC: 0 % (ref 0.0–0.2)

## 2021-12-05 LAB — COMPREHENSIVE METABOLIC PANEL
ALT: 34 U/L (ref 0–44)
AST: 32 U/L (ref 15–41)
Albumin: 3.3 g/dL — ABNORMAL LOW (ref 3.5–5.0)
Alkaline Phosphatase: 78 U/L (ref 38–126)
Anion gap: 10 (ref 5–15)
BUN: 18 mg/dL (ref 8–23)
CO2: 21 mmol/L — ABNORMAL LOW (ref 22–32)
Calcium: 9.2 mg/dL (ref 8.9–10.3)
Chloride: 107 mmol/L (ref 98–111)
Creatinine, Ser: 0.76 mg/dL (ref 0.61–1.24)
GFR, Estimated: >60 mL/min (ref >60)
Glucose, Bld: 146 mg/dL — ABNORMAL HIGH (ref 70–99)
Potassium: 4.2 mmol/L (ref 3.5–5.1)
Sodium: 138 mmol/L (ref 135–145)
Total Bilirubin: 0.5 mg/dL (ref 0.3–1.2)
Total Protein: 6.6 g/dL (ref 6.5–8.1)

## 2021-12-05 LAB — URINALYSIS, ROUTINE W REFLEX MICROSCOPIC
Bilirubin Urine: NEGATIVE
Glucose, UA: NEGATIVE mg/dL
Hgb urine dipstick: NEGATIVE
Ketones, ur: NEGATIVE mg/dL
Leukocytes,Ua: NEGATIVE
Nitrite: NEGATIVE
Protein, ur: NEGATIVE mg/dL
Specific Gravity, Urine: 1.026 (ref 1.005–1.030)
pH: 5 (ref 5.0–8.0)

## 2021-12-05 LAB — AMMONIA: Ammonia: 27 umol/L (ref 9–35)

## 2021-12-05 MED ORDER — METOPROLOL TARTRATE 25 MG PO TABS
25.0000 mg | ORAL_TABLET | Freq: Two times a day (BID) | ORAL | Status: DC
  Administered 2021-12-05 – 2021-12-07 (×5): 25 mg via ORAL
  Filled 2021-12-05 (×5): qty 1

## 2021-12-05 MED ORDER — ADULT MULTIVITAMIN W/MINERALS CH
1.0000 | ORAL_TABLET | Freq: Every day | ORAL | Status: DC
  Administered 2021-12-05 – 2021-12-07 (×3): 1 via ORAL
  Filled 2021-12-05 (×3): qty 1

## 2021-12-05 MED ORDER — ATORVASTATIN CALCIUM 40 MG PO TABS
40.0000 mg | ORAL_TABLET | Freq: Every day | ORAL | Status: DC
Start: 2021-12-05 — End: 2021-12-08
  Administered 2021-12-05 – 2021-12-07 (×3): 40 mg via ORAL
  Filled 2021-12-05 (×3): qty 1

## 2021-12-05 MED ORDER — ENSURE ENLIVE PO LIQD
237.0000 mL | Freq: Three times a day (TID) | ORAL | Status: DC
  Administered 2021-12-05 – 2021-12-06 (×3): 237 mL via ORAL
  Filled 2021-12-05 (×3): qty 237

## 2021-12-05 MED ORDER — ASPIRIN 81 MG PO TBEC
81.0000 mg | DELAYED_RELEASE_TABLET | Freq: Every day | ORAL | Status: DC
  Administered 2021-12-05 – 2021-12-07 (×3): 81 mg via ORAL
  Filled 2021-12-05 (×3): qty 1

## 2021-12-05 MED ORDER — AMLODIPINE BESYLATE 5 MG PO TABS
10.0000 mg | ORAL_TABLET | Freq: Every day | ORAL | Status: DC
  Administered 2021-12-05 – 2021-12-07 (×3): 10 mg via ORAL
  Filled 2021-12-05 (×3): qty 2

## 2021-12-05 MED ORDER — LEVETIRACETAM 500 MG PO TABS
500.0000 mg | ORAL_TABLET | Freq: Two times a day (BID) | ORAL | Status: DC
  Administered 2021-12-05 – 2021-12-07 (×5): 500 mg via ORAL
  Filled 2021-12-05 (×5): qty 1

## 2021-12-05 NOTE — ED Triage Notes (Signed)
Patient here with failure to thrive and increased confusion. Patient had a stroke and continues to live alone per family. Patient needs more care and she is doing all she can but afraid to leave him alone. Alert to person and place

## 2021-12-05 NOTE — ED Provider Notes (Signed)
[facility] EMERGENCY DEPARTMENT Provider Note   CSN: [id number] Arrival date & time: 12/05/21  1227     History  No chief complaint on file.   Dennis Levine is a 71 y.o. male.  The history is provided by the patient and a relative. No language interpreter was used.  Altered Mental Status Presenting symptoms: confusion and disorientation (per family)   Severity:  Severe Most recent episode:  More than 2 days ago Episode history:  Multiple Timing:  Intermittent Progression:  Waxing and waning Chronicity:  New Context: dementia   Context: not nursing home resident, not recent change in medication, not recent illness and not recent infection   Associated symptoms: decreased appetite   Associated symptoms: no abdominal pain, no agitation, no fever, no hallucinations, no headaches, no light-headedness, no nausea, no rash, no slurred speech, no visual change, no vomiting and no weakness        Home Medications Prior to Admission medications   Medication Sig Start Date End Date Taking? Authorizing Provider  amLODipine (NORVASC) 10 MG tablet Take 1 tablet (10 mg total) by mouth daily. 04/22/21   Medina-Vargas, Monina C, NP  aspirin EC 81 MG tablet Take 1 tablet (81 mg total) by mouth daily. Swallow whole. 10/12/21   Bonnell Public, MD  atorvastatin (LIPITOR) 40 MG tablet Take 1 tablet (40 mg total) by mouth daily. 10/12/21 11/11/21  Bonnell Public, MD  feeding supplement (ENSURE ENLIVE / ENSURE PLUS) LIQD Take 237 mLs by mouth 3 (three) times daily between meals. 04/13/21   Elgergawy, Silver Huguenin, MD  folic acid (FOLVITE) 1 MG tablet Take 1 tablet (1 mg total) by mouth daily. 04/22/21   Medina-Vargas, Monina C, NP  levETIRAcetam (KEPPRA) 500 MG tablet Take 1 tablet (500 mg total) by mouth 2 (two) times daily. 04/22/21   Medina-Vargas, Monina C, NP  metoprolol tartrate (LOPRESSOR) 25 MG tablet Take 1 tablet (25 mg total) by mouth 2 (two) times daily. Patient not  taking: Reported on 10/07/2021 04/22/21   Medina-Vargas, Monina C, NP  Multiple Vitamin (MULTIVITAMIN WITH MINERALS) TABS tablet Take 1 tablet by mouth daily. 04/13/21   Elgergawy, Silver Huguenin, MD  polyethylene glycol (MIRALAX / GLYCOLAX) 17 g packet Take 17 g by mouth daily as needed for moderate constipation. 03/30/21   Shelly Coss, MD      Allergies    Patient has no known allergies.    Review of Systems   Review of Systems  Constitutional:  Positive for appetite change and decreased appetite. Negative for chills, diaphoresis, fatigue and fever.  HENT:  Negative for congestion.   Respiratory:  Negative for cough, chest tightness, shortness of breath and wheezing.   Cardiovascular:  Negative for chest pain.  Gastrointestinal:  Negative for abdominal pain, constipation, diarrhea, nausea and vomiting.  Genitourinary:  Negative for dysuria, flank pain and frequency.  Musculoskeletal:  Negative for back pain and neck pain.  Skin:  Negative for rash and wound.  Neurological:  Negative for dizziness, speech difficulty, weakness, light-headedness, numbness and headaches.  Psychiatric/Behavioral:  Positive for behavioral problems (wandering out of house in dangerous ways) and confusion. Negative for agitation and hallucinations.   All other systems reviewed and are negative.   Physical Exam Updated Vital Signs BP 132/77   Pulse 66   Temp 97.6 F (36.4 C) (Oral)   Resp (!) 22   SpO2 100%  Physical Exam Vitals and nursing note reviewed.  Constitutional:  General: He is not in acute distress.    Appearance: He is well-developed. He is not ill-appearing, toxic-appearing or diaphoretic.  HENT:     Head: Normocephalic and atraumatic.     Nose: Nose normal.     Mouth/Throat:     Mouth: Mucous membranes are moist.     Pharynx: No oropharyngeal exudate or posterior oropharyngeal erythema.  Eyes:     Extraocular Movements: Extraocular movements intact.     Conjunctiva/sclera:  Conjunctivae normal.     Pupils: Pupils are equal, round, and reactive to light.  Cardiovascular:     Rate and Rhythm: Normal rate and regular rhythm.     Heart sounds: No murmur heard. Pulmonary:     Effort: Pulmonary effort is normal. No respiratory distress.     Breath sounds: Normal breath sounds. No wheezing, rhonchi or rales.  Chest:     Chest wall: No tenderness.  Abdominal:     General: Abdomen is flat.     Palpations: Abdomen is soft.     Tenderness: There is no abdominal tenderness. There is no guarding or rebound.  Musculoskeletal:        General: Tenderness (toe) present. No swelling.     Cervical back: Neck supple. No tenderness.     Right lower leg: No edema.     Left lower leg: No edema.  Skin:    General: Skin is warm and dry.     Capillary Refill: Capillary refill takes less than 2 seconds.     Findings: No erythema or rash.  Neurological:     General: No focal deficit present.     Mental Status: He is alert and oriented to person, place, and time.     Sensory: No sensory deficit.     Motor: No weakness.  Psychiatric:        Mood and Affect: Mood normal.     ED Results / Procedures / Treatments   Labs (all labs ordered are listed, but only abnormal results are displayed) Labs Reviewed  COMPREHENSIVE METABOLIC PANEL - Abnormal; Notable for the following components:      Result Value   CO2 21 (*)    Glucose, Bld 146 (*)    Albumin 3.3 (*)    All other components within normal limits  CBC WITH DIFFERENTIAL/PLATELET - Abnormal; Notable for the following components:   Hemoglobin 12.1 (*)    All other components within normal limits  URINALYSIS, ROUTINE W REFLEX MICROSCOPIC  AMMONIA  CBG MONITORING, ED    EKG EKG Interpretation  Date/Time:  Sunday December 05 2021 13:41:51 EDT Ventricular Rate:  80 PR Interval:  152 QRS Duration: 80 QT Interval:  386 QTC Calculation: 445 R Axis:   54 Text Interpretation: Normal sinus rhythm Biatrial enlargement  Abnormal ECG When compared with ECG of 15-Nov-2021 18:11, PREVIOUS ECG IS PRESENT when compared to prior, more artifact. No STEMI Confirmed by Antony Blackbird [phone]) on 12/05/2021 4:28:37 PM  Radiology DG Chest Portable 1 View  Result Date: 12/05/2021 CLINICAL DATA:  Intermittent altered mental status. EXAM: PORTABLE CHEST 1 VIEW COMPARISON:  Chest x-ray 11/15/2021 FINDINGS: The heart size and mediastinal contours are within normal limits. Both lungs are clear. The visualized skeletal structures are unremarkable. IMPRESSION: No active disease. Electronically Signed   By: Ronney Asters M.D.   On: 12/05/2021 17:03   CT Head Wo Contrast  Result Date: 12/05/2021 CLINICAL DATA:  Mental status change. Failure to thrive. Recent infarct. EXAM: CT HEAD WITHOUT CONTRAST TECHNIQUE:  Contiguous axial images were obtained from the base of the skull through the vertex without intravenous contrast. RADIATION DOSE REDUCTION: This exam was performed according to the departmental dose-optimization program which includes automated exposure control, adjustment of the mA and/or kV according to patient size and/or use of iterative reconstruction technique. COMPARISON:  CT head without contrast 11/15/2021. MR head without contrast 10/07/2021. MR head with contrast 10/08/2021. FINDINGS: Brain: Expected evolution of right of infarct in the right frontal operculum and frontal lobe are noted. Evolving infarcts are present in the medial and inferior left occipital lobe. Moderate atrophy and white matter disease is otherwise stable. A remote lacunar infarct is present in the left lentiform nucleus. The ventricles are of normal size. No significant extraaxial fluid collection is present. The brainstem and cerebellum are within normal limits. Vascular: Atherosclerotic changes are present within the cavernous internal carotid arteries bilaterally. No hyperdense vessel is present. Skull: Calvarium is intact. No focal lytic or blastic lesions  are present. No significant extracranial soft tissue lesion is present. Sinuses/Orbits: The paranasal sinuses and mastoid air cells are clear. Globes are not imaged. IMPRESSION: 1. Expected evolution of right frontal lobe infarcts. 2. Evolving infarcts in the medial and inferior left occipital lobe. 3. Stable atrophy and white matter disease. This likely reflects the sequela of chronic microvascular ischemia. 4. No acute intracranial abnormality. Electronically Signed   By: San Morelle M.D.   On: 12/05/2021 14:29    Procedures Procedures    Medications Ordered in ED Medications  levETIRAcetam (KEPPRA) tablet 500 mg (has no administration in time range)  atorvastatin (LIPITOR) tablet 40 mg (has no administration in time range)  aspirin EC tablet 81 mg (has no administration in time range)  amLODipine (NORVASC) tablet 10 mg (has no administration in time range)  metoprolol tartrate (LOPRESSOR) tablet 25 mg (has no administration in time range)  feeding supplement (ENSURE ENLIVE / ENSURE PLUS) liquid 237 mL (has no administration in time range)  multivitamin with minerals tablet 1 tablet (has no administration in time range)    ED Course/ Medical Decision Making/ A&P                           Medical Decision Making Amount and/or Complexity of Data Reviewed Radiology: ordered.  Risk OTC drugs. Prescription drug management.    CORWIN KUIKEN is a 71 y.o. male with a past medical history significant for previous stroke, prostate cancer, recent foot fracture, seizures, meningioma, CKD, and hypertension who presents for altered mental status and unsafe behavior.  According to niece whom I spoke with on the phone, for the last several days, patient has been getting out of the house where he lives alone and has been wandering and had to be brought back by Event organiser.  He reportedly almost wandered into [place] where he may have been hit by car.  He has documentation  during his previous ED visit where they will to find information on some dementia however patient currently lives alone.  Family says that recently he has been getting more confused and is having these dangerous episodes where he is wandering out of the house alone.  Patient reports no symptoms and is denying any headache, neck pain, chest pain, abdominal pain, nausea, vomiting, constipation, diarrhea, or urinary changes.  He denies any hallucinations agitation or any complaints aside from pain in his right toe that he knows is broken.  On my exam, lungs clear  and chest nontender.  Abdomen nontender.  He is alert and oriented x3.  He has no complaints.  No evidence of acute trauma on my exam.  Patient has some tenderness in his right foot but otherwise no reported new trauma.  Patient had work-up to look for etiologies of altered mental status and these worsening confusion.  Laboratory work-up was reassuring.  Urinalysis does not show any evidence of infection, ammonia normal, CBC and CMP reassuring.  Chest x-ray did not show pneumonia and CT head showed expected evolution of previous strokes.  Patient otherwise resting without complaints with reassuring vital signs.  I spoke again to the daughter that it did not appear we found any work-up that would necessitate admission however I agree with her I am concerned about him nearly wandering in the street every day for the last 3 days.  It is unclear what is causing his more precipitous decline in confusion and these behaviors however family reports they cannot be with him at night tonight and do not feel safe with him discharging home as he may get hit by car.  We will consult the transition of care team and see what options are available for him.  The niece said that she was already engaging with the social work team to try to get some help or even placement but it has been a slow process and she is concerned about him getting hurt before a safe plan can be  determined.   Patient will remain the emergency department overnight and we will order his home meds.  He has no complaints at this time.  Patient switched over to border status to await transition of care team evaluation and discussion with family about safe disposition.        Final Clinical Impression(s) / ED Diagnoses Final diagnoses:  Intermittent confusion  Wandering behavior    Clinical Impression: 1. Intermittent confusion   2. Wandering behavior     Disposition: Awaiting transitions of care evaluation in morning to determine safe disposition as he has been unsafely wandering out of the home with disorientation and confusion which is new and there is no way for him to go tonight with someone to stay with.  This note was prepared with assistance of Systems analyst. Occasional wrong-word or sound-a-like substitutions may have occurred due to the inherent limitations of voice recognition software.      Shannen Flansburg, Gwenyth Allegra, MD 12/05/21 [phone]

## 2021-12-05 NOTE — ED Provider Triage Note (Signed)
Emergency Medicine Provider Triage Evaluation Note  Dennis Levine , a 71 y.o. male  was evaluated in triage.  Dennis Levine with niece who is one of his caregivers.  She states that he is having increased confusion and states that today he was wandering outside the house when she got there for breakfast.  She states that he had a stroke in June but since then has been declining.  She is concerned that he lives alone and she cannot take care of him 24/7.  Denies any known falls.  Patient has no complaints.  No focal deficits.  He denies chest pain, shortness of breath, abdominal pain, nausea, vomiting, diarrhea, dysuria..  Niece endorses that he is eating per usual  Review of Systems  Positive: See above Negative:   Physical Exam  BP 130/85 (BP Location: Right Arm)   Pulse 79   Temp 97.6 F (36.4 C) (Oral)   Resp 16   SpO2 100%  Gen:   Awake, no distress   Resp:  Normal effort  MSK:   Moves extremities without difficulty  Other:    Medical Decision Making  Medically screening exam initiated at 1:25 PM.  Appropriate orders placed.  Dennis Levine was informed that the remainder of the evaluation will be completed by another provider, this initial triage assessment does not replace that evaluation, and the importance of remaining in the ED until their evaluation is complete.     Dennis Hillier, PA-C 12/05/21 1327

## 2021-12-06 DIAGNOSIS — R410 Disorientation, unspecified: Secondary | ICD-10-CM | POA: Diagnosis not present

## 2021-12-06 MED ORDER — QUETIAPINE FUMARATE 25 MG PO TABS
25.0000 mg | ORAL_TABLET | Freq: Every day | ORAL | Status: DC
  Administered 2021-12-06 – 2021-12-07 (×2): 25 mg via ORAL
  Filled 2021-12-06 (×2): qty 1

## 2021-12-06 NOTE — ED Notes (Signed)
Tele monitor for safety requested from staffing.

## 2021-12-06 NOTE — ED Notes (Signed)
This RN stepped into another pt's room. While this RN was with another pt, pt got out of bed and was wandering around hallways. This RN found pt wandering around hallways and assisted him back to bed. RN educated pt on importance of calling for assistance before getting out of bed by himself. Callbell in reach. Bed low and locked. Siderails up.

## 2021-12-06 NOTE — ED Notes (Signed)
Ambulated pt to bathroom and back to bed. Pt in bed resting comfortably. Call bell in reach. Bed locked and in low position with side rails up.

## 2021-12-06 NOTE — Discharge Planning (Signed)
Licensed Clinical Social Worker is seeking post-discharge placement for this patient at the following level of care: skilled nursing facility    

## 2021-12-06 NOTE — Progress Notes (Signed)
CSW spoke with patients niece who chose [facility] for SNF. CSW started insurance authorization

## 2021-12-06 NOTE — ED Notes (Signed)
RN found pt trying to get out of bed.  He had to go answer the door.  Pt was confused.  RN explained that the sound he heard was the laundry cart going by.  Pt could not comprehend that, he insisted on going to the door (which was standing wide open all night) and looking.  Pt did look around.  He was very unstable on his feet.  RN held the pt for stability.  He then decided he needed to use the bathroom, RN assisted him in standing while he used the urinal.  Pt then got back into bed.

## 2021-12-06 NOTE — ED Notes (Signed)
Assisted pt in using urinal. Pt in bed resting. Call bell in reach. Side rails up. Bed locked and in low position.

## 2021-12-06 NOTE — Progress Notes (Signed)
Pt is currently awaiting auth from insurance.  Hemi Chacko Tarpley-Carter, MSW, LCSW-A Pronouns:  She/Her/Hers [facility] HealthTransitions of Care Clinical Social Worker Direct Number:  [phone] [email or web address]

## 2021-12-06 NOTE — Progress Notes (Signed)
CSW spoke with patients niece Izell [place] [phone] who stated she has been caring for patient. Ms. Owens Shark stated patient was doing good until he had his second stroke. Ms. Owens Shark stated she does his grocery shopping and checks in with him at home. Ms. Owens Shark does not believe he can live on his own. Ms. Owens Shark asked CSW if patient can go back to [facility]. CSW told Ms. Owens Shark it sounds like patients needs long term care and she does not know if [facility] any beds available. Ms. Owens Shark stated she would like patient close but if the facility can't be then she is fine with whatever.  CSW contacted Kitty with admissions at [facility] who has no long term male beds

## 2021-12-06 NOTE — Evaluation (Signed)
Physical Therapy Evaluation Patient Details Name: Dennis Levine MRN: [id number] DOB: 05-Jul-1950 Today's Date: 12/06/2021  History of Present Illness  Pt is a 70 y/o male presenting to the ED secondary to increased confusion and inability to care for himself.  PMH includes: CVA, dementia, meningioma, CKD II, HTN, prostate cancer, seizures, hepatitis.  Clinical Impression  Pt admitted secondary to problem above with deficits below. Pt with poor memory and poor insight into deficits. Unsteady and requiring assist to maintain balance. Pt currently lives alone and is at increased risk for falls. Recommending SNF level therapies at d/c. Will continue to follow acutely.        Recommendations for follow up therapy are one component of a multi-disciplinary discharge planning process, led by the attending physician.  Recommendations may be updated based on patient status, additional functional criteria and insurance authorization.  Follow Up Recommendations Skilled nursing-short term rehab (<3 hours/day) Can patient physically be transported by private vehicle: Yes    Assistance Recommended at Discharge Frequent or constant Supervision/Assistance  Patient can return home with the following  A little help with walking and/or transfers;A lot of help with bathing/dressing/bathroom;Assistance with cooking/housework;Help with stairs or ramp for entrance;Assist for transportation    Equipment Recommendations None recommended by PT  Recommendations for Other Services       Functional Status Assessment Patient has had a recent decline in their functional status and demonstrates the ability to make significant improvements in function in a reasonable and predictable amount of time.     Precautions / Restrictions Precautions Precautions: Fall Restrictions Weight Bearing Restrictions: No      Mobility  Bed Mobility Overal bed mobility: Needs Assistance Bed Mobility: Supine to Sit, Sit to Supine      Supine to sit: Min assist Sit to supine: Min assist   General bed mobility comments: Assist for trunk and LEs    Transfers Overall transfer level: Needs assistance Equipment used: 1 person hand held assist Transfers: Sit to/from Stand Sit to Stand: Min assist           General transfer comment: Min A for lift assist and steadying.    Ambulation/Gait Ambulation/Gait assistance: Min assist Gait Distance (Feet): 40 Feet Assistive device: 1 person hand held assist Gait Pattern/deviations: Step-through pattern, Decreased weight shift to right Gait velocity: Decreased     General Gait Details: Slow, unsteady gait. Shuffled steps. Pt with mild knee buckling on the RLE. Consistent min A throughout.  Stairs            Wheelchair Mobility    Modified Rankin (Stroke Patients Only)       Balance Overall balance assessment: Needs assistance Sitting-balance support: No upper extremity supported, Feet supported Sitting balance-Leahy Scale: Fair     Standing balance support: Single extremity supported Standing balance-Leahy Scale: Poor Standing balance comment: Reliant on UE support                             Pertinent Vitals/Pain Pain Assessment Pain Assessment: No/denies pain    Home Living Family/patient expects to be discharged to:: Private residence Living Arrangements: Alone Available Help at Discharge: Family;Available PRN/intermittently Type of Home: Apartment Home Access: Stairs to enter Entrance Stairs-Rails: None Entrance Stairs-Number of Steps: 3   Home Layout: One level Home Equipment: None Additional Comments: Info from prior notes as pt with increased confusion.    Prior Function Prior Level of Function : Needs assist;Patient poor historian/Family  not available             Mobility Comments: pt reports independent, but unsure of accuracy       Hand Dominance        Extremity/Trunk Assessment   Upper Extremity  Assessment Upper Extremity Assessment: Generalized weakness    Lower Extremity Assessment Lower Extremity Assessment: Generalized weakness    Cervical / Trunk Assessment Cervical / Trunk Assessment: Kyphotic  Communication   Communication: No difficulties  Cognition Arousal/Alertness: Awake/alert Behavior During Therapy: WFL for tasks assessed/performed Overall Cognitive Status: No family/caregiver present to determine baseline cognitive functioning                                 General Comments: Disoriented to place and time. Unable to remember his room number. Slow processing.        General Comments      Exercises     Assessment/Plan    PT Assessment Patient needs continued PT services  PT Problem List Decreased strength;Decreased range of motion;Decreased activity tolerance;Decreased balance;Decreased mobility;Decreased cognition;Decreased safety awareness;Decreased knowledge of use of DME;Decreased knowledge of precautions       PT Treatment Interventions DME instruction;Gait training;Functional mobility training;Therapeutic exercise;Therapeutic activities;Neuromuscular re-education;Balance training;Patient/family education    PT Goals (Current goals can be found in the Care Plan section)  Acute Rehab PT Goals Patient Stated Goal: to go home PT Goal Formulation: With patient Time For Goal Achievement: 12/20/21 Potential to Achieve Goals: Good    Frequency Min 2X/week     Co-evaluation               AM-PAC PT "6 Clicks" Mobility  Outcome Measure Help needed turning from your back to your side while in a flat bed without using bedrails?: A Little Help needed moving from lying on your back to sitting on the side of a flat bed without using bedrails?: A Little Help needed moving to and from a bed to a chair (including a wheelchair)?: A Little Help needed standing up from a chair using your arms (e.g., wheelchair or bedside chair)?: A  Little Help needed to walk in hospital room?: A Little Help needed climbing 3-5 steps with a railing? : Total 6 Click Score: 16    End of Session Equipment Utilized During Treatment: Gait belt Activity Tolerance: Patient tolerated treatment well Patient left: in bed;with call bell/phone within reach (on stretcher in ED) Nurse Communication: Mobility status PT Visit Diagnosis: Unsteadiness on feet (R26.81);Muscle weakness (generalized) (M62.81);Difficulty in walking, not elsewhere classified (R26.2)    Time: 1055-1110 PT Time Calculation (min) (ACUTE ONLY): 15 min   Charges:   PT Evaluation $PT Eval Moderate Complexity: 1 Mod          Reuel Derby, PT, DPT  Acute Rehabilitation Services  Office: [phone]   Rudean Hitt 12/06/2021, 2:18 PM

## 2021-12-06 NOTE — ED Notes (Signed)
Tele monitor in room and on.  Tele sitting notified.

## 2021-12-06 NOTE — ED Provider Notes (Signed)
Emergency Medicine Observation Re-evaluation Note  Dennis Levine is a 71 y.o. male, seen on rounds today.  Pt initially presented to the ED for complaints of confusion, ?dementia, with general weakness and behavioral symptoms. Pt also w some trouble sleeping at night and wandering behaviors. PT eval has recommended SNF.  TOC is working on SNF placement. Pt currently denies any c/o - is intently watching tv.   Physical Exam  BP 121/77   Pulse 67   Temp 98.7 F (37.1 C) (Oral)   Resp 18   SpO2 99%  Physical Exam General: alert, content.  Cardiac: regular rate.  Lungs: breathing comfortably.  Psych: calm, content, conversant. Denies feeling depressed. Normal mood/affect.    ED Course / MDM    I have reviewed the labs performed to date as well as medications administered while in observation.  Recent changes in the last 24 hours include ED obs, reassessment, TOC placement.   Plan  TOC placement remains pending.   Will add low dose seroquel qhs for symptom improvement.   Placement per [facility] team.       Lajean Saver, MD 12/06/21 [phone]

## 2021-12-06 NOTE — Discharge Planning (Signed)
RNCM consulted regarding safe discharge planning (Home with Home Health vs Skilled Nursing Facility Placement).  Physical Therapy evaluation placed; will follow up after recommendations from PT.     

## 2021-12-06 NOTE — ED Notes (Signed)
Report given to tele sitter. Tele sitter set up.

## 2021-12-06 NOTE — ED Notes (Signed)
Pt found wandering around halls. RN assisted pt back to bed. Health and safety inspector requested.

## 2021-12-06 NOTE — NC FL2 (Addendum)
[place] LEVEL OF CARE SCREENING TOOL     IDENTIFICATION  Patient Name: Dennis Levine Birthdate: Oct 14, 1950 Sex: male Admission Date (Current Location): 12/05/2021  [facility] and [place] Number:  Herbalist and Address:  [facility], [place] [address], [place],  [address]      Provider Number: [id number]  Attending Physician Name and Address:  Default, Provider, MD  Relative Name and Phone Number:  Dennis Levine (Niece)   [phone]    Current Level of Care: Hospital Recommended Level of Care: [place], Other (Comment) (Long term care) Prior Approval Number:    Date Approved/Denied:   PASRR Number: [id number]  Discharge Plan: SNF    Current Diagnoses: Patient Active Problem List   Diagnosis Date Noted   CVA (cerebral vascular accident) ([place]) 10/07/2021   Hypernatremia 10/07/2021   Generalized weakness 10/07/2021   Dementia ([place]) 04/23/2021   Benign essential HTN 04/17/2021   Meningioma ([place]) 04/16/2021   Alcohol abuse 04/16/2021   SARS-CoV-2 positive 04/16/2021   CKD (chronic kidney disease), symptom management only, stage 2 (mild) 04/16/2021   Hyponatremia 04/16/2021   Prediabetes 04/16/2021   Protein-calorie malnutrition, severe 03/26/2021   Seizures ([place]) 03/23/2021   Prostate cancer ([place]) 02/03/2016    Orientation RESPIRATION BLADDER Height & Weight     Self, Place  Normal Continent Weight: 120  Height:  5'11   BEHAVIORAL SYMPTOMS/MOOD NEUROLOGICAL BOWEL NUTRITION STATUS      Continent Diet (Regular)  AMBULATORY STATUS COMMUNICATION OF NEEDS Skin   Extensive Assist Verbally Normal                       Personal Care Assistance Level of Assistance  Bathing, Feeding, Dressing Bathing Assistance: Maximum assistance Feeding assistance: Independent Dressing Assistance: Maximum assistance     Functional Limitations Info  Sight, Hearing, Speech Sight Info: Adequate Hearing  Info: Adequate Speech Info: Adequate    SPECIAL CARE FACTORS FREQUENCY                       Contractures Contractures Info: Not present    Additional Factors Info  Code Status, Allergies Code Status Info: Full Allergies Info: No known listed           Current Medications (12/06/2021):  This is the current hospital active medication list Current Facility-Administered Medications  Medication Dose Route Frequency Provider Last Rate Last Admin   amLODipine (NORVASC) tablet 10 mg  10 mg Oral Daily Tegeler, Gwenyth Allegra, MD   10 mg at 12/06/21 0914   aspirin EC tablet 81 mg  81 mg Oral Daily Tegeler, Gwenyth Allegra, MD   81 mg at 12/06/21 0913   atorvastatin (LIPITOR) tablet 40 mg  40 mg Oral Daily Tegeler, Gwenyth Allegra, MD   40 mg at 12/06/21 0913   feeding supplement (ENSURE ENLIVE / ENSURE PLUS) liquid 237 mL  237 mL Oral TID BM Tegeler, Gwenyth Allegra, MD   237 mL at 12/06/21 1050   levETIRAcetam (KEPPRA) tablet 500 mg  500 mg Oral BID Tegeler, Gwenyth Allegra, MD   500 mg at 12/06/21 0912   metoprolol tartrate (LOPRESSOR) tablet 25 mg  25 mg Oral BID Tegeler, Gwenyth Allegra, MD   25 mg at 12/06/21 0913   multivitamin with minerals tablet 1 tablet  1 tablet Oral Daily Tegeler, Gwenyth Allegra, MD   1 tablet at 12/06/21 4259   Current Outpatient Medications  Medication Sig Dispense  Refill   amLODipine (NORVASC) 10 MG tablet Take 1 tablet (10 mg total) by mouth daily. 30 tablet 0   aspirin EC 81 MG tablet Take 1 tablet (81 mg total) by mouth daily. Swallow whole. 30 tablet 12   atorvastatin (LIPITOR) 40 MG tablet Take 1 tablet (40 mg total) by mouth daily. 30 tablet 0   feeding supplement (ENSURE ENLIVE / ENSURE PLUS) LIQD Take 237 mLs by mouth 3 (three) times daily between meals. 494 mL 12   folic acid (FOLVITE) 1 MG tablet Take 1 tablet (1 mg total) by mouth daily. 30 tablet 0   levETIRAcetam (KEPPRA) 500 MG tablet Take 1 tablet (500 mg total) by mouth 2 (two) times daily. 60  tablet 0   metoprolol tartrate (LOPRESSOR) 25 MG tablet Take 1 tablet (25 mg total) by mouth 2 (two) times daily. (Patient not taking: Reported on 10/07/2021) 60 tablet 0   Multiple Vitamin (MULTIVITAMIN WITH MINERALS) TABS tablet Take 1 tablet by mouth daily.     polyethylene glycol (MIRALAX / GLYCOLAX) 17 g packet Take 17 g by mouth daily as needed for moderate constipation. 14 each 0     Discharge Medications: Please see discharge summary for a list of discharge medications.  Relevant Imaging Results:  Relevant Lab Results:   Additional Information SS# [id number]  Raina Mina, LCSWA

## 2021-12-06 NOTE — ED Notes (Signed)
Sitter requested again from charge.

## 2021-12-07 ENCOUNTER — Ambulatory Visit: Payer: Medicare Other | Admitting: Family Medicine

## 2021-12-07 DIAGNOSIS — R410 Disorientation, unspecified: Secondary | ICD-10-CM | POA: Diagnosis not present

## 2021-12-07 NOTE — ED Notes (Signed)
PTAR called for transport.  

## 2021-12-07 NOTE — ED Notes (Signed)
Received verbal report from [place] at this time

## 2021-12-07 NOTE — Progress Notes (Signed)
OT Cancellation Note  Patient Details Name: Dennis Levine MRN: [id number] DOB: 10-15-1950   Cancelled Treatment:    Reason Eval/Treat Not Completed: Other (comment) (SNF recommended. Spoke with SW and OT eval is not needed for ins approval. Will defer OT needs to SNF.)  Rosela Supak,HILLARY 12/07/2021, 1:00 PM Maurie Boettcher, OT/L   Acute OT Clinical Specialist Acute Rehabilitation Services Pager [phone] Office [phone]

## 2021-12-07 NOTE — ED Notes (Signed)
Attempted to call report - no answer at this time-  

## 2021-12-07 NOTE — ED Provider Notes (Signed)
Emergency Medicine Observation Re-evaluation Note  Dennis Levine is a 71 y.o. male, seen on rounds today.  Pt initially presented to the ED for complaints of No chief complaint on file. Currently, the patient is resting currently in bed.  Physical Exam  BP 138/73   Pulse (!) 59   Temp 97.7 F (36.5 C) (Oral)   Resp (!) 23   SpO2 99%  Physical Exam General: No acute distress   ED Course / MDM  EKG:EKG Interpretation  Date/Time:  Sunday December 05 2021 13:41:51 EDT Ventricular Rate:  80 PR Interval:  152 QRS Duration: 80 QT Interval:  386 QTC Calculation: 445 R Axis:   54 Text Interpretation: Normal sinus rhythm Biatrial enlargement Abnormal ECG When compared with ECG of 15-Nov-2021 18:11, PREVIOUS ECG IS PRESENT when compared to prior, more artifact. No STEMI Confirmed by Antony Blackbird [phone]) on 12/05/2021 4:28:37 PM  I have reviewed the labs performed to date as well as medications administered while in observation.  Recent changes in the last 24 hours include improved sleep.  Plan  Current plan is for patient to go to new facility today.    Lacretia Leigh, MD 12/07/21 1047

## 2021-12-07 NOTE — Progress Notes (Signed)
Patient is going to [facility]. Room 120 B. Number for report is [phone]. CSW also informed patients niece Izell [place].

## 2021-12-07 NOTE — ED Notes (Signed)
Report called to nurse sherrelle at [facility]

## 2021-12-07 NOTE — ED Notes (Signed)
Received call from tele sitter advising pt trying to get out of bed. Found pt standing at door stating he needed to go to the restroom. Pt was ambulated to restroom with assistance at this time

## 2021-12-07 NOTE — ED Notes (Signed)
PTAR called for transport to [facility] PT[facility] advised he is 26th on the list

## 2021-12-24 ENCOUNTER — Ambulatory Visit (INDEPENDENT_AMBULATORY_CARE_PROVIDER_SITE_OTHER): Payer: Medicare Other | Admitting: Podiatry

## 2021-12-24 ENCOUNTER — Encounter: Payer: Self-pay | Admitting: Podiatry

## 2021-12-24 DIAGNOSIS — N182 Chronic kidney disease, stage 2 (mild): Secondary | ICD-10-CM

## 2021-12-24 DIAGNOSIS — M79674 Pain in right toe(s): Secondary | ICD-10-CM

## 2021-12-24 DIAGNOSIS — M79675 Pain in left toe(s): Secondary | ICD-10-CM | POA: Diagnosis not present

## 2021-12-24 DIAGNOSIS — B351 Tinea unguium: Secondary | ICD-10-CM | POA: Insufficient documentation

## 2021-12-24 NOTE — Progress Notes (Signed)
This patient presents  to my office for at risk foot care.  This patient requires this care by a professional since this patient will be at risk due to having  CKD.  This patient is unable to cut nails himself since the patient cannot reach his nails.These nails are painful walking and wearing shoes.  This patient presents for at risk foot care today.  General Appearance  Alert, conversant and in no acute stress.  Vascular  Dorsalis pedis and posterior tibial  pulses are palpable  bilaterally.  Capillary return is within normal limits  bilaterally. Temperature is within normal limits  bilaterally.  Neurologic  Senn-Weinstein monofilament wire test within normal limits /diminished bilaterally. Muscle power within normal limits bilaterally.  Nails Thick disfigured discolored nails with subungual debris  from hallux to fifth toes bilaterally. No evidence of bacterial infection or drainage bilaterally.  Orthopedic  No limitations of motion  feet .  No crepitus or effusions noted.  No bony pathology or digital deformities noted.  Skin  normotropic skin with no porokeratosis noted bilaterally.  No signs of infections or ulcers noted.     Onychomycosis  Pain in right toes  Pain in left toes  Consent was obtained for treatment procedures.   Mechanical debridement of nails 1-5  bilaterally performed with a nail nipper.  Filed with dremel without incident.    Return office visit  3 months                    Told patient to return for periodic foot care and evaluation due to potential at risk complications.   Gardiner Barefoot DPM

## 2022-08-27 ENCOUNTER — Emergency Department (HOSPITAL_COMMUNITY): Payer: 59

## 2022-08-27 ENCOUNTER — Emergency Department (HOSPITAL_COMMUNITY)
Admission: EM | Admit: 2022-08-27 | Discharge: 2022-08-27 | Disposition: A | Discharge status: Skilled Nursing Facility | Payer: 59 | Attending: Emergency Medicine | Admitting: Emergency Medicine

## 2022-08-27 ENCOUNTER — Encounter (HOSPITAL_COMMUNITY): Payer: Self-pay

## 2022-08-27 ENCOUNTER — Other Ambulatory Visit: Payer: Self-pay

## 2022-08-27 DIAGNOSIS — W19XXXA Unspecified fall, initial encounter: Secondary | ICD-10-CM | POA: Insufficient documentation

## 2022-08-27 DIAGNOSIS — F0390 Unspecified dementia without behavioral disturbance: Secondary | ICD-10-CM | POA: Diagnosis not present

## 2022-08-27 DIAGNOSIS — Z7982 Long term (current) use of aspirin: Secondary | ICD-10-CM | POA: Diagnosis not present

## 2022-08-27 DIAGNOSIS — M25511 Pain in right shoulder: Secondary | ICD-10-CM | POA: Diagnosis present

## 2022-08-27 NOTE — ED Triage Notes (Signed)
Dennis Levine is a 72 y.o. male coming from nursing home pt was found on floor this morning pt is alert and oriented x 1 hx of dementia pt does have hx of seizures and on Keppra Pt denies any pain

## 2022-08-27 NOTE — Discharge Instructions (Signed)
We obtained CT imaging of Dennis Levine head and neck as well as his right shoulder all of which were reassuring for acute findings.  Given that he is at his baseline, we have discharged him back to his facility.  I recommend close primary care follow-up for continued evaluation.  Return if development of any new or worsening symptoms.

## 2022-08-27 NOTE — ED Notes (Signed)
Pt awaiting for PTAR

## 2022-08-27 NOTE — ED Notes (Signed)
Pt to ct 

## 2022-08-27 NOTE — ED Provider Notes (Signed)
[place] EMERGENCY DEPARTMENT AT [facility] Provider Note   CSN: [id number] Arrival date & time: 08/27/22  4098     History  Chief Complaint  Patient presents with   Dennis Levine Needs is a 72 y.o. male.  Patient with history of advanced dementia presents today with complaints of fall. According to [facility] nursing home staff who I spoke with on the phone, patient was seen around 7 am this morning and then approximately 1 hour later was found lying on the ground. Unclear details regarding events given unwitnessed fall and therefore patient was transferred here for evaluation.  According to staff, patient is alert and oriented x 1 which is his baseline.  He is not anticoagulated.  Patient has no complaints.  Level 5 caveat -- dementia  The history is provided by the patient. No language interpreter was used.  Fall       Home Medications Prior to Admission medications   Medication Sig Start Date End Date Taking? Authorizing Provider  amLODipine (NORVASC) 10 MG tablet Take 1 tablet (10 mg total) by mouth daily. 04/22/21   Medina-Vargas, Monina C, NP  aspirin EC 81 MG tablet Take 1 tablet (81 mg total) by mouth daily. Swallow whole. 10/12/21   Barnetta Chapel, MD  atorvastatin (LIPITOR) 40 MG tablet Take 1 tablet (40 mg total) by mouth daily. 10/12/21 11/11/21  Barnetta Chapel, MD  feeding supplement (ENSURE ENLIVE / ENSURE PLUS) LIQD Take 237 mLs by mouth 3 (three) times daily between meals. 04/13/21   Elgergawy, Leana Roe, MD  folic acid (FOLVITE) 1 MG tablet Take 1 tablet (1 mg total) by mouth daily. 04/22/21   Medina-Vargas, Monina C, NP  levETIRAcetam (KEPPRA) 500 MG tablet Take 1 tablet (500 mg total) by mouth 2 (two) times daily. 04/22/21   Medina-Vargas, Monina C, NP  metoprolol tartrate (LOPRESSOR) 25 MG tablet Take 1 tablet (25 mg total) by mouth 2 (two) times daily. Patient not taking: Reported on 10/07/2021 04/22/21   Medina-Vargas, Monina C, NP  Multiple  Vitamin (MULTIVITAMIN WITH MINERALS) TABS tablet Take 1 tablet by mouth daily. 04/13/21   Elgergawy, Leana Roe, MD  polyethylene glycol (MIRALAX / GLYCOLAX) 17 g packet Take 17 g by mouth daily as needed for moderate constipation. 03/30/21   Burnadette Pop, MD      Allergies    Patient has no known allergies.    Review of Systems   Review of Systems  Unable to perform ROS: Dementia    Physical Exam Updated Vital Signs BP (!) 142/89 (BP Location: Right Arm)   Pulse 76   Temp 97.8 F (36.6 C) (Oral)   Resp 17   Ht 5\' 11"  (1.803 m)   Wt 68 kg   SpO2 100%   BMI 20.91 kg/m  Physical Exam Vitals and nursing note reviewed.  Constitutional:      General: He is not in acute distress.    Appearance: Normal appearance. He is normal weight. He is not ill-appearing, toxic-appearing or diaphoretic.  HENT:     Head: Normocephalic and atraumatic.     Comments: No Battle sign or raccoon eyes Eyes:     Extraocular Movements: Extraocular movements intact.     Pupils: Pupils are equal, round, and reactive to light.  Neck:     Comments: C collar in place Cardiovascular:     Rate and Rhythm: Normal rate and regular rhythm.     Heart sounds: Normal heart sounds.  Pulmonary:     Effort: Pulmonary effort is normal. No respiratory distress.  Chest:     Comments: No TTP over the chest Abdominal:     General: Abdomen is flat.     Palpations: Abdomen is soft.     Tenderness: There is no abdominal tenderness.  Musculoskeletal:        General: Normal range of motion.     Cervical back: Normal range of motion.     Comments: No tenderness to palpation of thoracic or lumbar spine.  No focal areas of bony tenderness.  Skin:    General: Skin is warm and dry.     Findings: No bruising or lesion.  Neurological:     General: No focal deficit present.     Mental Status: He is alert. Mental status is at baseline.  Psychiatric:        Mood and Affect: Mood normal.        Behavior: Behavior normal.      ED Results / Procedures / Treatments   Labs (all labs ordered are listed, but only abnormal results are displayed) Labs Reviewed - No data to display  EKG None  Radiology No results found.  Procedures Procedures    Medications Ordered in ED Medications - No data to display  ED Course/ Medical Decision Making/ A&P                             Medical Decision Making Amount and/or Complexity of Data Reviewed Radiology: ordered.   Patient presents today with complaints of fall this morning.  He is afebrile, nontoxic-appearing, and in no acute distress with reassuring vital signs.  Physical exam benign.  Discussed with patient's facility who states he is at his baseline mental status.  According to staff, patient was complaining of right shoulder pain, he did not have any tenderness in this area.  However, per patient's nursing home request, x-ray imaging was obtained of same which has resulted and reveals no acute findings.  I have personally reviewed and interpreted this imaging and agree with radiology interpretation.  Additionally, given unclear story and patient with advanced dementia, CT imaging obtained of the head and neck which are unremarkable for acute findings.  I personally reviewed and interpreted this imaging and agree with radiology interpretation.  Patient c-collar has been removed and patient continues to be without complaints.  I attempted several times to call his facility back as well as his family listed in contacts to make them aware of the plan and findings and did not receive an answer. Evaluation and diagnostic testing in the emergency department does not suggest an emergent condition requiring admission or immediate intervention beyond what has been performed at this time.  Plan for discharge with close PCP follow-up. Discussed plan and return precautions in patient's discharge paperwork given inability to contact family or his facility.  Patient discharged in  stable condition.  Findings and plan of care discussed with supervising physician Dr. Criss Alvine who is in agreement.    Final Clinical Impression(s) / ED Diagnoses Final diagnoses:  Fall, initial encounter    Rx / DC Orders ED Discharge Orders     None     An After Visit Summary was printed and given to the patient.     Vear Clock 08/27/22 1156    Pricilla Loveless, MD 08/28/22 1256

## 2022-11-18 ENCOUNTER — Encounter: Payer: Self-pay | Admitting: Family Medicine

## 2022-11-21 ENCOUNTER — Encounter: Payer: Self-pay | Admitting: Family Medicine

## 2022-12-19 IMAGING — CR DG CHEST 2V
2 series · 2 of 2 positions shown · non-contrast
Comparison: None.

CLINICAL DATA: Chest, shoulder and abdominal pain today.

EXAM:
CHEST - 2 VIEW

[chest lat]
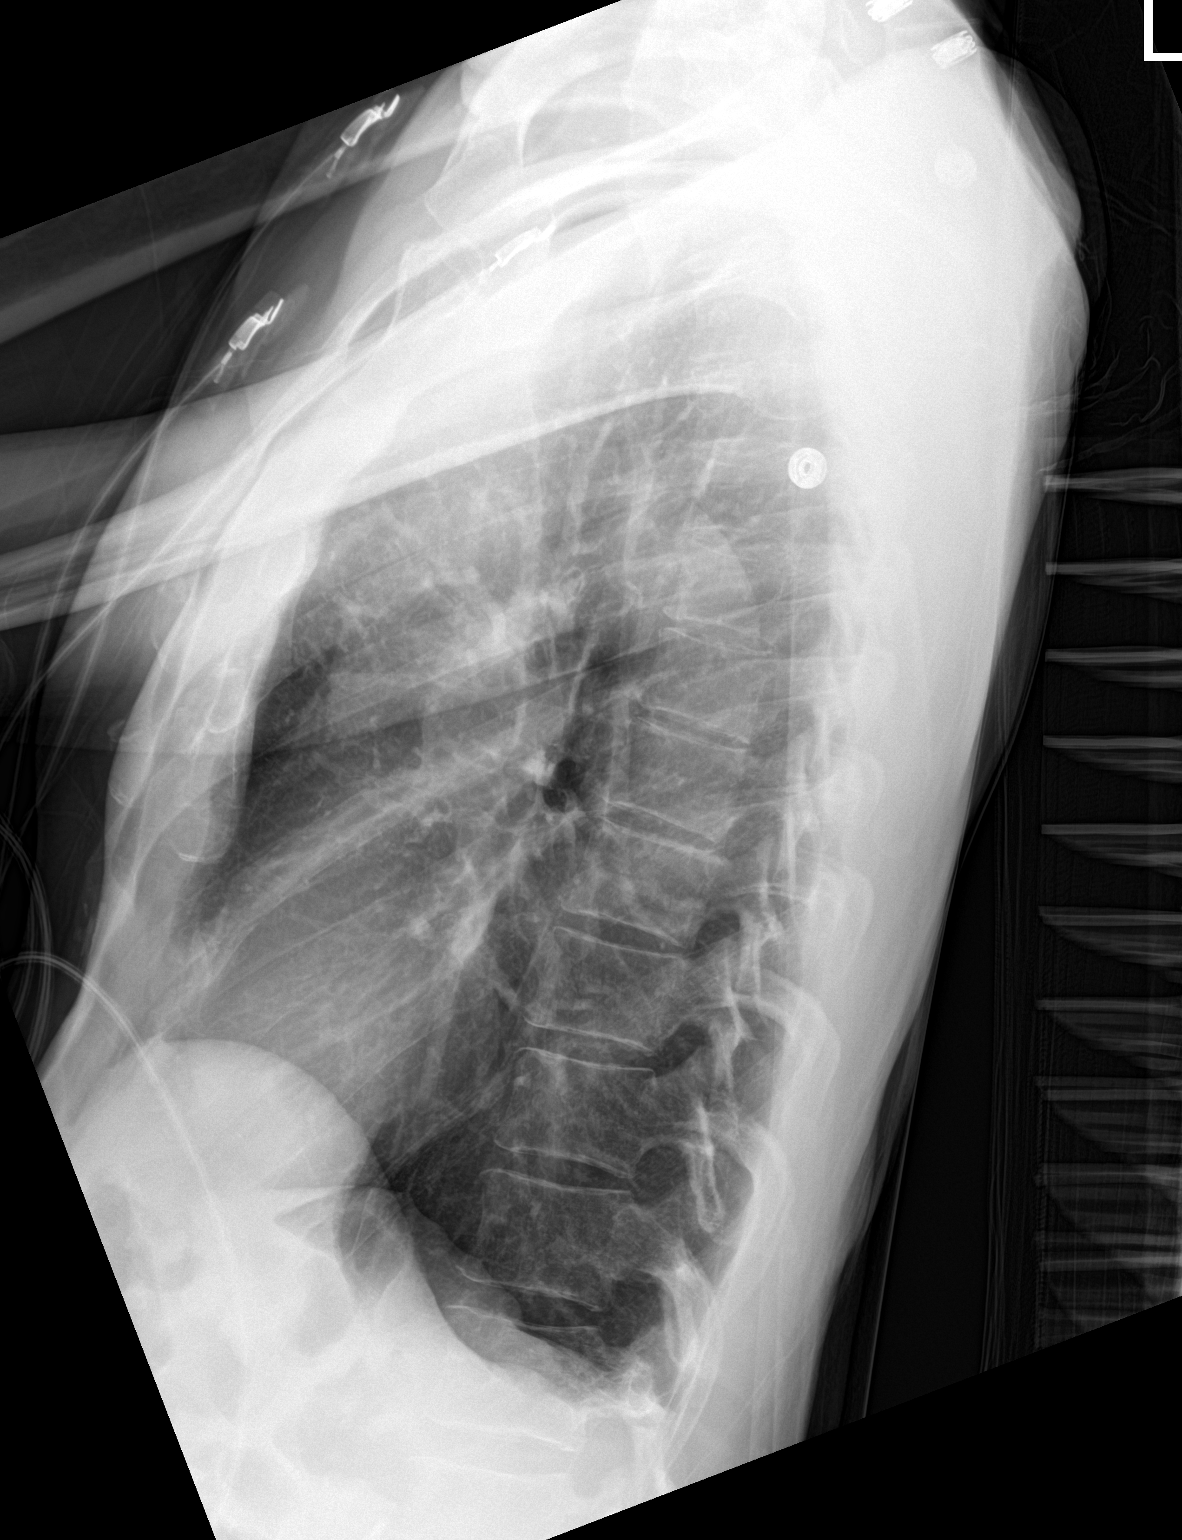

[chest ap]
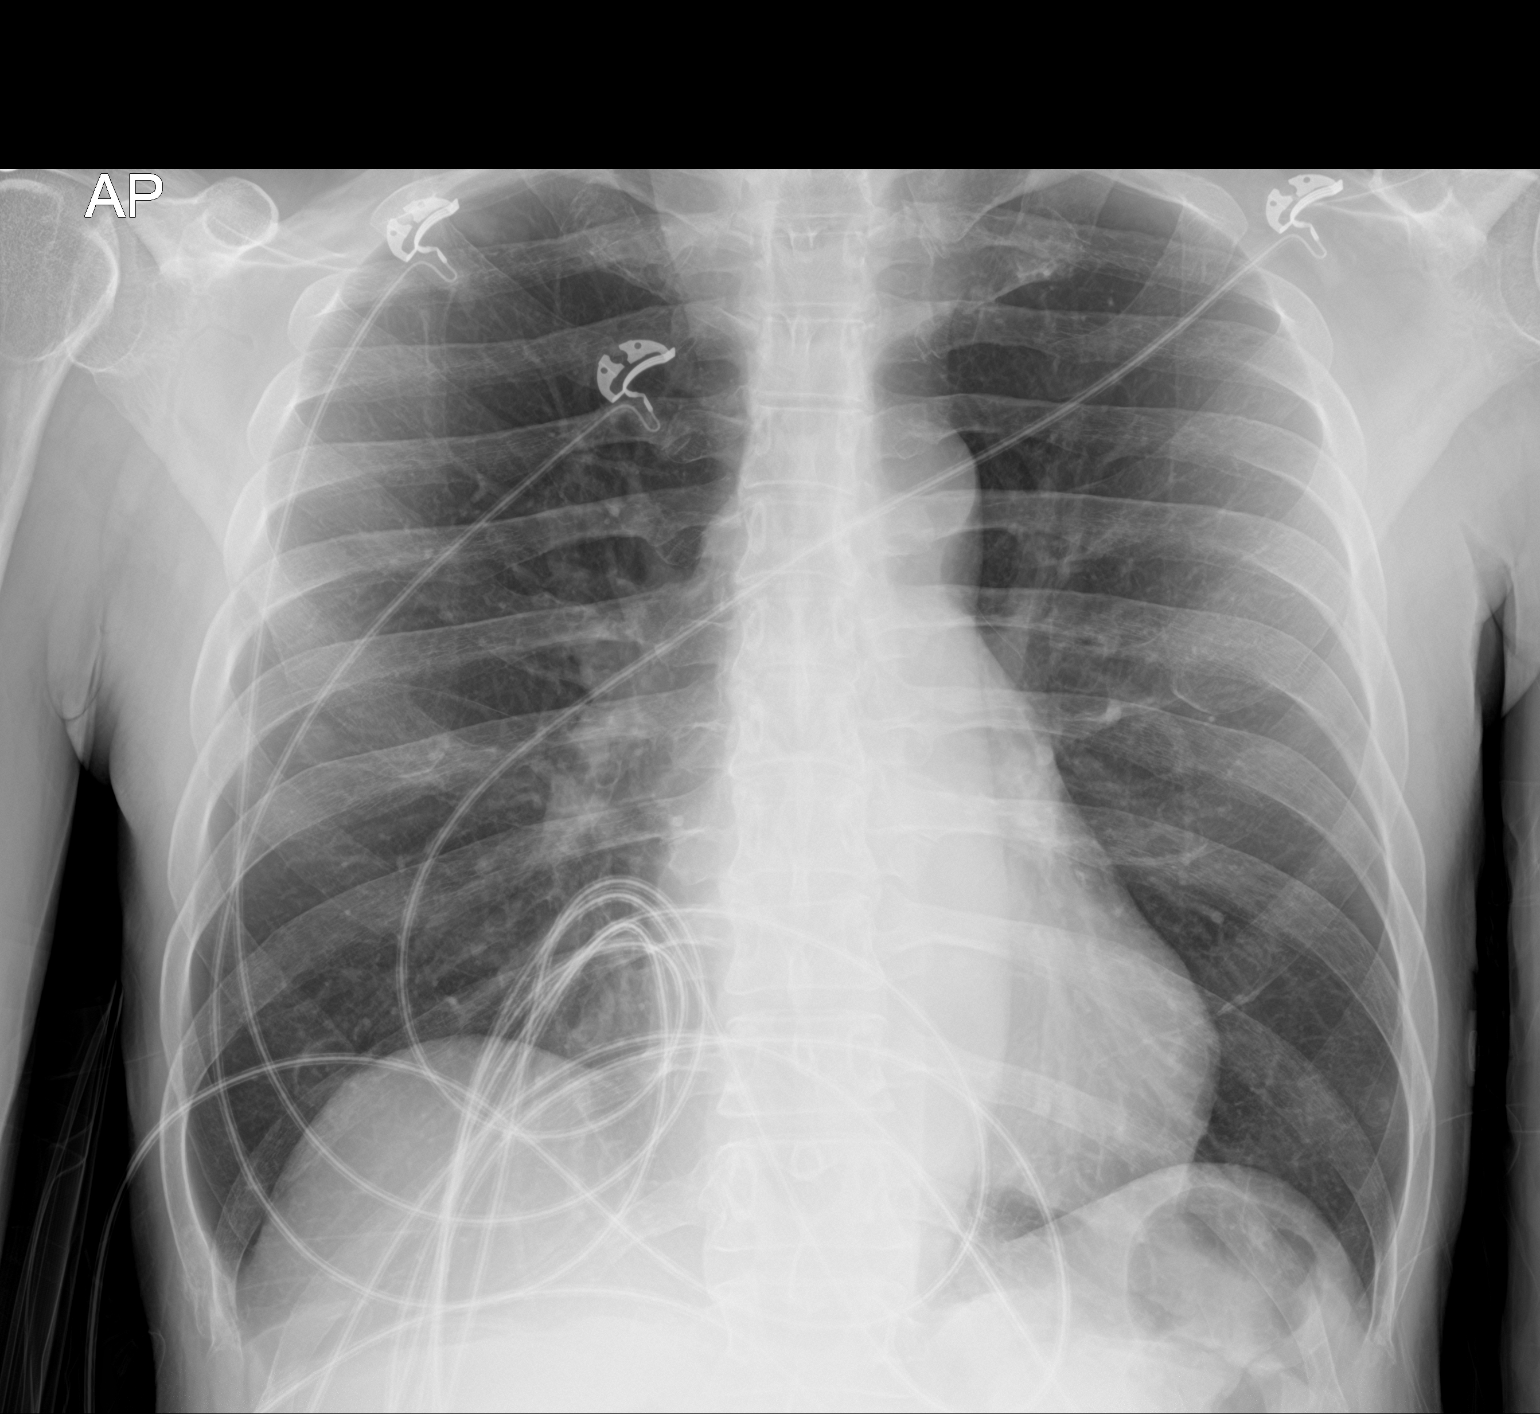

[2 of 2 positions shown; findings below may reference images not displayed]

FINDINGS: Lungs are clear. Heart size is normal. No pneumothorax or pleural
fluid. No bony abnormality.
IMPRESSION: Negative chest.

## 2023-02-14 ENCOUNTER — Emergency Department (HOSPITAL_COMMUNITY): Payer: 59

## 2023-02-14 ENCOUNTER — Encounter (HOSPITAL_COMMUNITY): Payer: Self-pay

## 2023-02-14 ENCOUNTER — Inpatient Hospital Stay (HOSPITAL_COMMUNITY)
Admission: EM | Admit: 2023-02-14 | Discharge: 2023-02-16 | DRG: 025 | Disposition: A | Discharge status: Skilled Nursing Facility | Payer: 59 | Source: Skilled Nursing Facility | Attending: Neurological Surgery | Admitting: Neurological Surgery

## 2023-02-14 ENCOUNTER — Inpatient Hospital Stay (HOSPITAL_COMMUNITY): Payer: 59

## 2023-02-14 ENCOUNTER — Encounter (HOSPITAL_COMMUNITY)
Admission: EM | Disposition: A | Discharge status: Skilled Nursing Facility | Payer: Self-pay | Source: Skilled Nursing Facility | Attending: Neurological Surgery

## 2023-02-14 ENCOUNTER — Other Ambulatory Visit: Payer: Self-pay

## 2023-02-14 DIAGNOSIS — Z7982 Long term (current) use of aspirin: Secondary | ICD-10-CM | POA: Diagnosis not present

## 2023-02-14 DIAGNOSIS — Z86011 Personal history of benign neoplasm of the brain: Secondary | ICD-10-CM | POA: Diagnosis not present

## 2023-02-14 DIAGNOSIS — X58XXXA Exposure to other specified factors, initial encounter: Secondary | ICD-10-CM | POA: Diagnosis present

## 2023-02-14 DIAGNOSIS — G8194 Hemiplegia, unspecified affecting left nondominant side: Secondary | ICD-10-CM | POA: Diagnosis present

## 2023-02-14 DIAGNOSIS — I10 Essential (primary) hypertension: Secondary | ICD-10-CM | POA: Diagnosis present

## 2023-02-14 DIAGNOSIS — R569 Unspecified convulsions: Principal | ICD-10-CM

## 2023-02-14 DIAGNOSIS — G40909 Epilepsy, unspecified, not intractable, without status epilepticus: Secondary | ICD-10-CM | POA: Diagnosis present

## 2023-02-14 DIAGNOSIS — R29810 Facial weakness: Secondary | ICD-10-CM | POA: Diagnosis present

## 2023-02-14 DIAGNOSIS — E785 Hyperlipidemia, unspecified: Secondary | ICD-10-CM | POA: Diagnosis present

## 2023-02-14 DIAGNOSIS — N400 Enlarged prostate without lower urinary tract symptoms: Secondary | ICD-10-CM | POA: Diagnosis present

## 2023-02-14 DIAGNOSIS — I6200 Nontraumatic subdural hemorrhage, unspecified: Secondary | ICD-10-CM

## 2023-02-14 DIAGNOSIS — Z9889 Other specified postprocedural states: Secondary | ICD-10-CM

## 2023-02-14 DIAGNOSIS — G935 Compression of brain: Secondary | ICD-10-CM | POA: Diagnosis present

## 2023-02-14 DIAGNOSIS — Z79899 Other long term (current) drug therapy: Secondary | ICD-10-CM

## 2023-02-14 DIAGNOSIS — S065XAA Traumatic subdural hemorrhage with loss of consciousness status unknown, initial encounter: Principal | ICD-10-CM | POA: Diagnosis present

## 2023-02-14 DIAGNOSIS — F0390 Unspecified dementia without behavioral disturbance: Secondary | ICD-10-CM | POA: Diagnosis present

## 2023-02-14 HISTORY — PX: CRANIOTOMY: SHX93

## 2023-02-14 LAB — I-STAT CHEM 8, ED
BUN: 30 mg/dL — ABNORMAL HIGH (ref 8–23)
Calcium, Ion: 1.2 mmol/L (ref 1.15–1.40)
Chloride: 104 mmol/L (ref 98–111)
Creatinine, Ser: 1.7 mg/dL — ABNORMAL HIGH (ref 0.61–1.24)
Glucose, Bld: 115 mg/dL — ABNORMAL HIGH (ref 70–99)
HCT: 48 % (ref 39.0–52.0)
Hemoglobin: 16.3 g/dL (ref 13.0–17.0)
Potassium: 4.7 mmol/L (ref 3.5–5.1)
Sodium: 138 mmol/L (ref 135–145)
TCO2: 24 mmol/L (ref 22–32)

## 2023-02-14 LAB — COMPREHENSIVE METABOLIC PANEL
ALT: 32 U/L (ref 0–44)
AST: 30 U/L (ref 15–41)
Albumin: 3.3 g/dL — ABNORMAL LOW (ref 3.5–5.0)
Alkaline Phosphatase: 92 U/L (ref 38–126)
Anion gap: 13 (ref 5–15)
BUN: 26 mg/dL — ABNORMAL HIGH (ref 8–23)
CO2: 21 mmol/L — ABNORMAL LOW (ref 22–32)
Calcium: 9.5 mg/dL (ref 8.9–10.3)
Chloride: 102 mmol/L (ref 98–111)
Creatinine, Ser: 1.66 mg/dL — ABNORMAL HIGH (ref 0.61–1.24)
GFR, Estimated: 43 mL/min — ABNORMAL LOW (ref >60)
Glucose, Bld: 122 mg/dL — ABNORMAL HIGH (ref 70–99)
Potassium: 4.6 mmol/L (ref 3.5–5.1)
Sodium: 136 mmol/L (ref 135–145)
Total Bilirubin: 1.8 mg/dL — ABNORMAL HIGH (ref <1.2)
Total Protein: 7.9 g/dL (ref 6.5–8.1)

## 2023-02-14 LAB — RAPID URINE DRUG SCREEN, HOSP PERFORMED
Amphetamines: NOT DETECTED
Barbiturates: NOT DETECTED
Benzodiazepines: NOT DETECTED
Cocaine: NOT DETECTED
Opiates: NOT DETECTED
Tetrahydrocannabinol: NOT DETECTED

## 2023-02-14 LAB — DIFFERENTIAL
Abs Immature Granulocytes: 0.04 10*3/uL (ref 0.00–0.07)
Basophils Absolute: 0 10*3/uL (ref 0.0–0.1)
Basophils Relative: 0 %
Eosinophils Absolute: 0 10*3/uL (ref 0.0–0.5)
Eosinophils Relative: 0 %
Immature Granulocytes: 1 %
Lymphocytes Relative: 14 %
Lymphs Abs: 1.1 10*3/uL (ref 0.7–4.0)
Monocytes Absolute: 0.7 10*3/uL (ref 0.1–1.0)
Monocytes Relative: 9 %
Neutro Abs: 6.2 10*3/uL (ref 1.7–7.7)
Neutrophils Relative %: 76 %

## 2023-02-14 LAB — PROTIME-INR
INR: 1.1 (ref 0.8–1.2)
Prothrombin Time: 14.3 s (ref 11.4–15.2)

## 2023-02-14 LAB — TYPE AND SCREEN
ABO/RH(D): B POS
Antibody Screen: NEGATIVE

## 2023-02-14 LAB — URINALYSIS, ROUTINE W REFLEX MICROSCOPIC
Bilirubin Urine: NEGATIVE
Glucose, UA: NEGATIVE mg/dL
Ketones, ur: 5 mg/dL — AB
Leukocytes,Ua: NEGATIVE
Nitrite: NEGATIVE
Protein, ur: NEGATIVE mg/dL
Specific Gravity, Urine: 1.016 (ref 1.005–1.030)
pH: 5 (ref 5.0–8.0)

## 2023-02-14 LAB — CBC
HCT: 44.3 % (ref 39.0–52.0)
Hemoglobin: 13.7 g/dL (ref 13.0–17.0)
MCH: 26.3 pg (ref 26.0–34.0)
MCHC: 30.9 g/dL (ref 30.0–36.0)
MCV: 85.2 fL (ref 80.0–100.0)
Platelets: 268 10*3/uL (ref 150–400)
RBC: 5.2 MIL/uL (ref 4.22–5.81)
RDW: 13.3 % (ref 11.5–15.5)
WBC: 8.2 10*3/uL (ref 4.0–10.5)
nRBC: 0 % (ref 0.0–0.2)

## 2023-02-14 LAB — APTT: aPTT: 25 s (ref 24–36)

## 2023-02-14 LAB — ETHANOL: Alcohol, Ethyl (B): <10 mg/dL (ref <10)

## 2023-02-14 LAB — ABO/RH: ABO/RH(D): B POS

## 2023-02-14 LAB — MRSA NEXT GEN BY PCR, NASAL: MRSA by PCR Next Gen: NOT DETECTED

## 2023-02-14 SURGERY — CRANIOTOMY HEMATOMA EVACUATION SUBDURAL
Anesthesia: General | Laterality: Right

## 2023-02-14 MED ORDER — ONDANSETRON HCL 4 MG/2ML IJ SOLN
INTRAMUSCULAR | Status: AC
  Filled 2023-02-14: qty 2

## 2023-02-14 MED ORDER — SUGAMMADEX SODIUM 200 MG/2ML IV SOLN
INTRAVENOUS | Status: DC | PRN
  Administered 2023-02-14 (×2): 200 mg via INTRAVENOUS

## 2023-02-14 MED ORDER — HYDROCODONE-ACETAMINOPHEN 5-325 MG PO TABS: 1.0000 | ORAL_TABLET | ORAL | Status: DC | PRN

## 2023-02-14 MED ORDER — PANTOPRAZOLE SODIUM 40 MG IV SOLR
40.0000 mg | Freq: Every day | INTRAVENOUS | Status: DC
  Administered 2023-02-14: 40 mg via INTRAVENOUS
  Filled 2023-02-14: qty 10

## 2023-02-14 MED ORDER — ATORVASTATIN CALCIUM 40 MG PO TABS: 40.0000 mg | ORAL_TABLET | Freq: Every day | ORAL | Status: DC

## 2023-02-14 MED ORDER — ONDANSETRON HCL 4 MG/2ML IJ SOLN
INTRAMUSCULAR | Status: DC | PRN
  Administered 2023-02-14: 4 mg via INTRAVENOUS

## 2023-02-14 MED ORDER — LIDOCAINE 2% (20 MG/ML) 5 ML SYRINGE
INTRAMUSCULAR | Status: AC
  Filled 2023-02-14: qty 5

## 2023-02-14 MED ORDER — DEXAMETHASONE SODIUM PHOSPHATE 4 MG/ML IJ SOLN
4.0000 mg | Freq: Four times a day (QID) | INTRAMUSCULAR | Status: DC
  Administered 2023-02-15 – 2023-02-16 (×2): 4 mg via INTRAVENOUS
  Filled 2023-02-14 (×2): qty 1

## 2023-02-14 MED ORDER — OXYCODONE HCL 5 MG PO TABS: 5.0000 mg | ORAL_TABLET | Freq: Once | ORAL | Status: DC | PRN

## 2023-02-14 MED ORDER — ONDANSETRON HCL 4 MG/2ML IJ SOLN
4.0000 mg | Freq: Four times a day (QID) | INTRAMUSCULAR | Status: DC | PRN
Start: 2023-02-14 — End: 2023-02-14

## 2023-02-14 MED ORDER — POLYETHYLENE GLYCOL 3350 17 G PO PACK: 17.0000 g | PACK | Freq: Every day | ORAL | Status: DC | PRN

## 2023-02-14 MED ORDER — LIDOCAINE-EPINEPHRINE 1 %-1:100000 IJ SOLN
INTRAMUSCULAR | Status: AC
  Filled 2023-02-14: qty 1

## 2023-02-14 MED ORDER — CEFAZOLIN SODIUM-DEXTROSE 2-4 GM/100ML-% IV SOLN
INTRAVENOUS | Status: AC
  Filled 2023-02-14: qty 100

## 2023-02-14 MED ORDER — THROMBIN 5000 UNITS EX SOLR: OROMUCOSAL | Status: DC | PRN

## 2023-02-14 MED ORDER — CEFAZOLIN SODIUM-DEXTROSE 2-3 GM-%(50ML) IV SOLR
INTRAVENOUS | Status: DC | PRN
  Administered 2023-02-14: 2 g via INTRAVENOUS

## 2023-02-14 MED ORDER — ALBUMIN HUMAN 5 % IV SOLN: INTRAVENOUS | Status: DC | PRN

## 2023-02-14 MED ORDER — ACETAMINOPHEN 325 MG PO TABS
650.0000 mg | ORAL_TABLET | ORAL | Status: DC | PRN
Start: 2023-02-14 — End: 2023-02-14

## 2023-02-14 MED ORDER — ORAL CARE MOUTH RINSE: 15.0000 mL | Freq: Once | OROMUCOSAL | Status: AC

## 2023-02-14 MED ORDER — CHLORHEXIDINE GLUCONATE CLOTH 2 % EX PADS
6.0000 | MEDICATED_PAD | Freq: Every day | CUTANEOUS | Status: DC
  Administered 2023-02-15 – 2023-02-16 (×3): 6 via TOPICAL

## 2023-02-14 MED ORDER — AMLODIPINE BESYLATE 10 MG PO TABS
10.0000 mg | ORAL_TABLET | Freq: Every day | ORAL | Status: DC
  Administered 2023-02-15 – 2023-02-16 (×2): 10 mg via ORAL
  Filled 2023-02-14 (×2): qty 1

## 2023-02-14 MED ORDER — ROCURONIUM BROMIDE 10 MG/ML (PF) SYRINGE
PREFILLED_SYRINGE | INTRAVENOUS | Status: DC | PRN
  Administered 2023-02-14: 60 mg via INTRAVENOUS

## 2023-02-14 MED ORDER — DROPERIDOL 2.5 MG/ML IJ SOLN: 0.6250 mg | Freq: Once | INTRAMUSCULAR | Status: DC | PRN

## 2023-02-14 MED ORDER — PHENYLEPHRINE 80 MCG/ML (10ML) SYRINGE FOR IV PUSH (FOR BLOOD PRESSURE SUPPORT)
PREFILLED_SYRINGE | INTRAVENOUS | Status: DC | PRN
  Administered 2023-02-14: 160 ug via INTRAVENOUS

## 2023-02-14 MED ORDER — AMLODIPINE BESYLATE 5 MG PO TABS: 5.0000 mg | ORAL_TABLET | Freq: Every day | ORAL | Status: DC

## 2023-02-14 MED ORDER — THROMBIN 20000 UNITS EX SOLR
CUTANEOUS | Status: AC
  Filled 2023-02-14: qty 20000

## 2023-02-14 MED ORDER — ONDANSETRON HCL 4 MG PO TABS
4.0000 mg | ORAL_TABLET | Freq: Four times a day (QID) | ORAL | Status: DC | PRN
Start: 2023-02-14 — End: 2023-02-14

## 2023-02-14 MED ORDER — KCL IN DEXTROSE-NACL 10-5-0.45 MEQ/L-%-% IV SOLN
INTRAVENOUS | Status: DC
  Filled 2023-02-14 (×2): qty 1000

## 2023-02-14 MED ORDER — METOPROLOL TARTRATE 25 MG PO TABS
25.0000 mg | ORAL_TABLET | Freq: Two times a day (BID) | ORAL | Status: DC
  Administered 2023-02-14 – 2023-02-16 (×4): 25 mg via ORAL
  Filled 2023-02-14 (×4): qty 1

## 2023-02-14 MED ORDER — PROPOFOL 10 MG/ML IV BOLUS
INTRAVENOUS | Status: DC | PRN
  Administered 2023-02-14: 50 mg via INTRAVENOUS

## 2023-02-14 MED ORDER — PHENYLEPHRINE 80 MCG/ML (10ML) SYRINGE FOR IV PUSH (FOR BLOOD PRESSURE SUPPORT)
PREFILLED_SYRINGE | INTRAVENOUS | Status: AC
  Filled 2023-02-14: qty 20

## 2023-02-14 MED ORDER — LIDOCAINE-EPINEPHRINE 1 %-1:100000 IJ SOLN
INTRAMUSCULAR | Status: DC | PRN
  Administered 2023-02-14: 10 mL

## 2023-02-14 MED ORDER — FOLIC ACID 1 MG PO TABS
1.0000 mg | ORAL_TABLET | Freq: Every day | ORAL | Status: DC
  Administered 2023-02-15 – 2023-02-16 (×2): 1 mg via ORAL
  Filled 2023-02-14 (×2): qty 1

## 2023-02-14 MED ORDER — ROCURONIUM BROMIDE 10 MG/ML (PF) SYRINGE
PREFILLED_SYRINGE | INTRAVENOUS | Status: AC
  Filled 2023-02-14: qty 10

## 2023-02-14 MED ORDER — OXYCODONE HCL 5 MG/5ML PO SOLN: 5.0000 mg | Freq: Once | ORAL | Status: DC | PRN

## 2023-02-14 MED ORDER — DEXAMETHASONE SODIUM PHOSPHATE 10 MG/ML IJ SOLN
INTRAMUSCULAR | Status: AC
  Filled 2023-02-14: qty 1

## 2023-02-14 MED ORDER — DEXAMETHASONE SODIUM PHOSPHATE 4 MG/ML IJ SOLN: 4.0000 mg | Freq: Three times a day (TID) | INTRAMUSCULAR | Status: DC

## 2023-02-14 MED ORDER — CHLORHEXIDINE GLUCONATE 0.12 % MT SOLN
OROMUCOSAL | Status: AC
  Administered 2023-02-14: 15 mL via OROMUCOSAL
  Filled 2023-02-14: qty 15

## 2023-02-14 MED ORDER — LEVETIRACETAM 750 MG PO TABS
750.0000 mg | ORAL_TABLET | Freq: Two times a day (BID) | ORAL | Status: DC
  Administered 2023-02-14: 750 mg via ORAL
  Filled 2023-02-14: qty 1

## 2023-02-14 MED ORDER — ACETAMINOPHEN 325 MG PO TABS: 650.0000 mg | ORAL_TABLET | ORAL | Status: DC | PRN

## 2023-02-14 MED ORDER — 0.9 % SODIUM CHLORIDE (POUR BTL) OPTIME
TOPICAL | Status: DC | PRN
  Administered 2023-02-14: 3000 mL

## 2023-02-14 MED ORDER — ACETAMINOPHEN 10 MG/ML IV SOLN: 1000.0000 mg | Freq: Once | INTRAVENOUS | Status: DC | PRN

## 2023-02-14 MED ORDER — BACITRACIN ZINC 500 UNIT/GM EX OINT
TOPICAL_OINTMENT | CUTANEOUS | Status: AC
  Filled 2023-02-14: qty 28.35

## 2023-02-14 MED ORDER — PHENYLEPHRINE HCL-NACL 20-0.9 MG/250ML-% IV SOLN
INTRAVENOUS | Status: DC | PRN
  Administered 2023-02-14: 50 ug/min via INTRAVENOUS

## 2023-02-14 MED ORDER — THROMBIN 20000 UNITS EX SOLR: CUTANEOUS | Status: DC | PRN

## 2023-02-14 MED ORDER — DEXAMETHASONE SODIUM PHOSPHATE 10 MG/ML IJ SOLN
INTRAMUSCULAR | Status: DC | PRN
  Administered 2023-02-14: 10 mg via INTRAVENOUS

## 2023-02-14 MED ORDER — LEVETIRACETAM IN NACL 1000 MG/100ML IV SOLN
1000.0000 mg | Freq: Once | INTRAVENOUS | Status: AC
  Administered 2023-02-14: 1000 mg via INTRAVENOUS
  Filled 2023-02-14: qty 100

## 2023-02-14 MED ORDER — ACETAMINOPHEN 10 MG/ML IV SOLN
INTRAVENOUS | Status: AC
  Filled 2023-02-14: qty 100

## 2023-02-14 MED ORDER — ORAL CARE MOUTH RINSE: 15.0000 mL | OROMUCOSAL | Status: DC | PRN

## 2023-02-14 MED ORDER — FENTANYL CITRATE (PF) 100 MCG/2ML IJ SOLN: 25.0000 ug | INTRAMUSCULAR | Status: DC | PRN

## 2023-02-14 MED ORDER — SODIUM CHLORIDE 0.9 % IV SOLN: INTRAVENOUS | Status: DC | PRN

## 2023-02-14 MED ORDER — ACETAMINOPHEN 650 MG RE SUPP: 650.0000 mg | RECTAL | Status: DC | PRN

## 2023-02-14 MED ORDER — SENNA 8.6 MG PO TABS
1.0000 | ORAL_TABLET | Freq: Two times a day (BID) | ORAL | Status: DC
  Administered 2023-02-14 – 2023-02-16 (×4): 8.6 mg via ORAL
  Filled 2023-02-14 (×4): qty 1

## 2023-02-14 MED ORDER — SODIUM CHLORIDE 0.9 % IV SOLN
750.0000 mg | Freq: Two times a day (BID) | INTRAVENOUS | Status: DC
  Administered 2023-02-14: 750 mg via INTRAVENOUS
  Filled 2023-02-14 (×8): qty 7.5

## 2023-02-14 MED ORDER — PROMETHAZINE HCL 25 MG PO TABS: 12.5000 mg | ORAL_TABLET | ORAL | Status: DC | PRN

## 2023-02-14 MED ORDER — CEFAZOLIN SODIUM-DEXTROSE 1-4 GM/50ML-% IV SOLN
1.0000 g | Freq: Three times a day (TID) | INTRAVENOUS | Status: AC
  Administered 2023-02-14 – 2023-02-15 (×2): 1 g via INTRAVENOUS
  Filled 2023-02-14 (×2): qty 50

## 2023-02-14 MED ORDER — ACETAMINOPHEN 10 MG/ML IV SOLN
INTRAVENOUS | Status: DC | PRN
  Administered 2023-02-14: 1000 mg via INTRAVENOUS

## 2023-02-14 MED ORDER — BACITRACIN ZINC 500 UNIT/GM EX OINT
TOPICAL_OINTMENT | CUTANEOUS | Status: DC | PRN
  Administered 2023-02-14: 1 via TOPICAL

## 2023-02-14 MED ORDER — MORPHINE SULFATE (PF) 2 MG/ML IV SOLN: 1.0000 mg | INTRAVENOUS | Status: DC | PRN

## 2023-02-14 MED ORDER — FENTANYL CITRATE (PF) 250 MCG/5ML IJ SOLN
INTRAMUSCULAR | Status: AC
  Filled 2023-02-14: qty 5

## 2023-02-14 MED ORDER — CHLORHEXIDINE GLUCONATE 0.12 % MT SOLN: 15.0000 mL | Freq: Once | OROMUCOSAL | Status: AC

## 2023-02-14 MED ORDER — LIDOCAINE 2% (20 MG/ML) 5 ML SYRINGE
INTRAMUSCULAR | Status: DC | PRN
  Administered 2023-02-14: 40 mg via INTRAVENOUS

## 2023-02-14 MED ORDER — ONDANSETRON HCL 4 MG PO TABS: 4.0000 mg | ORAL_TABLET | ORAL | Status: DC | PRN

## 2023-02-14 MED ORDER — SODIUM CHLORIDE 0.9% FLUSH: 10.0000 mL | Freq: Two times a day (BID) | INTRAVENOUS | Status: DC

## 2023-02-14 MED ORDER — ASPIRIN 81 MG PO TBEC: 81.0000 mg | DELAYED_RELEASE_TABLET | Freq: Every day | ORAL | Status: DC

## 2023-02-14 MED ORDER — DEXAMETHASONE SODIUM PHOSPHATE 10 MG/ML IJ SOLN
6.0000 mg | Freq: Four times a day (QID) | INTRAMUSCULAR | Status: AC
  Administered 2023-02-14 – 2023-02-15 (×4): 6 mg via INTRAVENOUS
  Filled 2023-02-14 (×4): qty 1

## 2023-02-14 MED ORDER — FENTANYL CITRATE (PF) 250 MCG/5ML IJ SOLN
INTRAMUSCULAR | Status: DC | PRN
  Administered 2023-02-14 (×2): 50 ug via INTRAVENOUS

## 2023-02-14 MED ORDER — THROMBIN 5000 UNITS EX SOLR
CUTANEOUS | Status: AC
  Filled 2023-02-14: qty 5000

## 2023-02-14 MED ORDER — ONDANSETRON HCL 4 MG/2ML IJ SOLN
4.0000 mg | INTRAMUSCULAR | Status: DC | PRN
Start: 2023-02-14 — End: 2023-02-16

## 2023-02-14 MED ORDER — LABETALOL HCL 5 MG/ML IV SOLN: 10.0000 mg | INTRAVENOUS | Status: DC | PRN

## 2023-02-14 SURGICAL SUPPLY — 55 items
BAG COUNTER SPONGE SURGICOUNT (BAG) ×1 IMPLANT
BAG SPNG CNTER NS LX DISP (BAG) ×1
BUR SPIRAL ROUTER 2.3 (BUR) ×1 IMPLANT
CANISTER SUCT 3000ML PPV (MISCELLANEOUS) ×1 IMPLANT
CLIP TI MEDIUM 6 (CLIP) IMPLANT
DRAIN JACKSON PRT FLT 7MM (DRAIN) IMPLANT
DRAPE MICROSCOPE SLANT 54X150 (MISCELLANEOUS) IMPLANT
DRAPE NEUROLOGICAL W/INCISE (DRAPES) ×1 IMPLANT
DRAPE SURG 17X23 STRL (DRAPES) IMPLANT
DRAPE WARM FLUID 44X44 (DRAPES) ×1 IMPLANT
DURAPREP 6ML APPLICATOR 50/CS (WOUND CARE) ×1 IMPLANT
ELECT REM PT RETURN 9FT ADLT (ELECTROSURGICAL) ×1
ELECTRODE REM PT RTRN 9FT ADLT (ELECTROSURGICAL) ×1 IMPLANT
EVACUATOR 1/8 PVC DRAIN (DRAIN) IMPLANT
EVACUATOR SILICONE 100CC (DRAIN) IMPLANT
GAUZE 4X4 16PLY ~~LOC~~+RFID DBL (SPONGE) IMPLANT
GAUZE SPONGE 4X4 12PLY STRL (GAUZE/BANDAGES/DRESSINGS) ×1 IMPLANT
GLOVE BIO SURGEON STRL SZ7 (GLOVE) IMPLANT
GLOVE BIO SURGEON STRL SZ8 (GLOVE) ×1 IMPLANT
GLOVE BIOGEL PI IND STRL 7.0 (GLOVE) IMPLANT
GOWN STRL REUS W/ TWL LRG LVL3 (GOWN DISPOSABLE) IMPLANT
GOWN STRL REUS W/ TWL XL LVL3 (GOWN DISPOSABLE) IMPLANT
GOWN STRL REUS W/TWL 2XL LVL3 (GOWN DISPOSABLE) IMPLANT
GOWN STRL REUS W/TWL LRG LVL3 (GOWN DISPOSABLE)
GOWN STRL REUS W/TWL XL LVL3 (GOWN DISPOSABLE)
HEMOSTAT POWDER KIT SURGIFOAM (HEMOSTASIS) IMPLANT
KIT BASIN OR (CUSTOM PROCEDURE TRAY) ×1 IMPLANT
KIT TURNOVER KIT B (KITS) ×1 IMPLANT
NDL HYPO 22X1.5 SAFETY MO (MISCELLANEOUS) ×1 IMPLANT
NEEDLE HYPO 22X1.5 SAFETY MO (MISCELLANEOUS) ×1 IMPLANT
NS IRRIG 1000ML POUR BTL (IV SOLUTION) ×1 IMPLANT
PACK CRANIOTOMY CUSTOM (CUSTOM PROCEDURE TRAY) ×1 IMPLANT
PAD ARMBOARD 7.5X6 YLW CONV (MISCELLANEOUS) ×1 IMPLANT
PATTIES SURGICAL .25X.25 (GAUZE/BANDAGES/DRESSINGS) IMPLANT
PATTIES SURGICAL .5 X.5 (GAUZE/BANDAGES/DRESSINGS) IMPLANT
PATTIES SURGICAL .5 X3 (DISPOSABLE) IMPLANT
PATTIES SURGICAL 1X1 (DISPOSABLE) IMPLANT
PERFORATOR LRG 14-11MM (BIT) ×1 IMPLANT
PIN MAYFIELD SKULL DISP (PIN) IMPLANT
PLATE CRANIAL 12 2H RIGID UNI (Plate) IMPLANT
SCREW UNIII AXS SD 1.5X4 (Screw) IMPLANT
SPONGE NEURO XRAY DETECT 1X3 (DISPOSABLE) IMPLANT
SPONGE SURGIFOAM ABS GEL 100 (HEMOSTASIS) ×1 IMPLANT
STAPLER VISISTAT 35W (STAPLE) ×1 IMPLANT
SUT ETHILON 3 0 FSL (SUTURE) IMPLANT
SUT NURALON 4 0 TR CR/8 (SUTURE) ×2 IMPLANT
SUT VIC AB 2-0 CP2 18 (SUTURE) ×1 IMPLANT
SYR CONTROL 10ML LL (SYRINGE) ×1 IMPLANT
TAPE CLOTH SURG 4X10 WHT LF (GAUZE/BANDAGES/DRESSINGS) IMPLANT
TOWEL GREEN STERILE (TOWEL DISPOSABLE) ×1 IMPLANT
TOWEL GREEN STERILE FF (TOWEL DISPOSABLE) ×1 IMPLANT
TRAY FOLEY MTR SLVR 16FR STAT (SET/KITS/TRAYS/PACK) IMPLANT
TUBING FEATHERFLOW (TUBING) IMPLANT
UNDERPAD 30X36 HEAVY ABSORB (UNDERPADS AND DIAPERS) IMPLANT
WATER STERILE IRR 1000ML POUR (IV SOLUTION) ×1 IMPLANT

## 2023-02-14 NOTE — Progress Notes (Signed)
Spoke to the pt's next of kin, Marilynn Latino, obtained a  telephone consent for surgery today, also agreed for pt to have blood if needed.

## 2023-02-14 NOTE — ED Provider Notes (Signed)
[place] EMERGENCY DEPARTMENT AT [facility] Provider Note   CSN: [id number] Arrival date & time: 02/14/23  0545     History  Chief Complaint  Patient presents with   Code Stroke    Dennis Levine is a 72 y.o. male.  Patient presents to the emergency department as a code stroke.  Patient had been witnessed to be at his normal baseline at bedtime last night at the nursing home.  Patient was then witnessed by staff sometime after 4 AM to be confused with left-sided facial droop, left extremity weakness.  At arrival to the ED, patient is awake and alert with improving symptoms.       Home Medications Prior to Admission medications   Medication Sig Start Date End Date Taking? Authorizing Provider  amLODipine (NORVASC) 10 MG tablet Take 1 tablet (10 mg total) by mouth daily. 04/22/21   Medina-Vargas, Monina C, NP  aspirin EC 81 MG tablet Take 1 tablet (81 mg total) by mouth daily. Swallow whole. 10/12/21   Barnetta Chapel, MD  atorvastatin (LIPITOR) 40 MG tablet Take 1 tablet (40 mg total) by mouth daily. 10/12/21 11/11/21  Barnetta Chapel, MD  feeding supplement (ENSURE ENLIVE / ENSURE PLUS) LIQD Take 237 mLs by mouth 3 (three) times daily between meals. 04/13/21   Elgergawy, Leana Roe, MD  folic acid (FOLVITE) 1 MG tablet Take 1 tablet (1 mg total) by mouth daily. 04/22/21   Medina-Vargas, Monina C, NP  levETIRAcetam (KEPPRA) 500 MG tablet Take 1 tablet (500 mg total) by mouth 2 (two) times daily. 04/22/21   Medina-Vargas, Monina C, NP  metoprolol tartrate (LOPRESSOR) 25 MG tablet Take 1 tablet (25 mg total) by mouth 2 (two) times daily. Patient not taking: Reported on 10/07/2021 04/22/21   Medina-Vargas, Monina C, NP  Multiple Vitamin (MULTIVITAMIN WITH MINERALS) TABS tablet Take 1 tablet by mouth daily. 04/13/21   Elgergawy, Leana Roe, MD  polyethylene glycol (MIRALAX / GLYCOLAX) 17 g packet Take 17 g by mouth daily as needed for moderate constipation. 03/30/21   Burnadette Pop, MD      Allergies    Patient has no known allergies.    Review of Systems   Review of Systems  Physical Exam Updated Vital Signs BP 108/79   Pulse 78   Temp (!) 97.5 F (36.4 C) (Oral)   Resp 19   Ht 5\' 11"  (1.803 m)   Wt 68 kg   SpO2 99%   BMI 20.91 kg/m  Physical Exam Vitals and nursing note reviewed.  Constitutional:      General: He is not in acute distress.    Appearance: He is well-developed.  HENT:     Head: Normocephalic and atraumatic.     Mouth/Throat:     Mouth: Mucous membranes are moist.  Eyes:     General: Vision grossly intact. Gaze aligned appropriately.     Extraocular Movements: Extraocular movements intact.     Conjunctiva/sclera: Conjunctivae normal.  Cardiovascular:     Rate and Rhythm: Normal rate and regular rhythm.     Pulses: Normal pulses.     Heart sounds: Normal heart sounds, S1 normal and S2 normal. No murmur heard.    No friction rub. No gallop.  Pulmonary:     Effort: Pulmonary effort is normal. No respiratory distress.     Breath sounds: Normal breath sounds.  Abdominal:     Palpations: Abdomen is soft.     Tenderness: There is no abdominal  tenderness. There is no guarding or rebound.     Hernia: No hernia is present.  Musculoskeletal:        General: No swelling.     Cervical back: Full passive range of motion without pain, normal range of motion and neck supple. No pain with movement, spinous process tenderness or muscular tenderness. Normal range of motion.     Right lower leg: No edema.     Left lower leg: No edema.  Skin:    General: Skin is warm and dry.     Capillary Refill: Capillary refill takes less than 2 seconds.     Findings: No ecchymosis, erythema, lesion or wound.  Neurological:     Mental Status: He is alert and oriented to person, place, and time.     GCS: GCS eye subscore is 4. GCS verbal subscore is 5. GCS motor subscore is 6.     Cranial Nerves: Cranial nerves 2-12 are intact.     Sensory: Sensation  is intact.     Motor: Motor function is intact. No weakness or abnormal muscle tone.     Coordination: Coordination is intact.  Psychiatric:        Mood and Affect: Mood normal.        Speech: Speech normal.        Behavior: Behavior normal.     ED Results / Procedures / Treatments   Labs (all labs ordered are listed, but only abnormal results are displayed) Labs Reviewed  PROTIME-INR  APTT  CBC  DIFFERENTIAL  COMPREHENSIVE METABOLIC PANEL  ETHANOL  RAPID URINE DRUG SCREEN, HOSP PERFORMED  URINALYSIS, ROUTINE W REFLEX MICROSCOPIC  I-STAT CHEM 8, ED    EKG EKG Interpretation Date/Time:  Tuesday February 14 2023 06:04:36 EST Ventricular Rate:  83 PR Interval:  166 QRS Duration:  92 QT Interval:  409 QTC Calculation: 481 R Axis:   -27  Text Interpretation: Sinus rhythm Right atrial enlargement Borderline left axis deviation Borderline low voltage, extremity leads Abnormal R-wave progression, early transition Consider inferior infarct Artifact in lead(s) I III aVR aVL No acute changes Confirmed by Gilda Crease [id number]) on 02/14/2023 6:23:08 AM  Radiology CT HEAD CODE STROKE WO CONTRAST  Result Date: 02/14/2023 CLINICAL DATA:  Code stroke. 72 year old male with left side deficit. Prior stroke. EXAM: CT HEAD WITHOUT CONTRAST TECHNIQUE: Contiguous axial images were obtained from the base of the skull through the vertex without intravenous contrast. RADIATION DOSE REDUCTION: This exam was performed according to the departmental dose-optimization program which includes automated exposure control, adjustment of the mA and/or kV according to patient size and/or use of iterative reconstruction technique. COMPARISON:  Brain MRI 10/08/2021.  Head CT 08/27/2022 and earlier. FINDINGS: Brain: Mixed density and lobulated right side subdural hematoma appears new since May. Multi septated appearance, and the collection measures up to 20 mm thickness on coronal image 37. But up to 30 mm  along the anterior frontal convexity as seen on coronal image 47. Mass effect on the right hemisphere. Leftward midline shift is 6-7 mm. Superimposed round and homogeneous right posterosuperior convexity meningioma visible on series 4, image 27, about 2.6 cm diameter and not significantly changed from last year. Mass effect on the right lateral ventricle. No ventriculomegaly. Basilar cisterns remain patent. No cortically based acute infarct identified. Vascular: Calcified atherosclerosis at the skull base. No suspicious intracranial vascular hyperdensity. Skull: Stable and intact. Sinuses/Orbits: Visualized paranasal sinuses and mastoids are stable and well aerated. Other: Mild rightward gaze. Chronic right  forehead benign scalp lipoma. ASPECTS [facility] Stroke Program Early CT Score) Total score (0-10 with 10 being normal): Not applicable, right side subdural hematoma. IMPRESSION: 1. Multiloculated, lobulated and mixed density Right Side Subdural Hematoma with maximal thickness of 20-30 mm. 2. Superimposed chronic right posterosuperior convexity Meningioma, about 26 mm diameter. 3. Intracranial mass effect with leftward midline shift of 6-7 mm. Basilar cisterns remain patent. 4. These results were communicated to Dr. Derry Lory at 6:10 am on 02/14/2023 by text page via the [facility] messaging system. Electronically Signed   By: Odessa Fleming M.D.   On: 02/14/2023 06:11    Procedures Procedures    Medications Ordered in ED Medications  levETIRAcetam (KEPPRA) IVPB 1000 mg/100 mL premix (1,000 mg Intravenous New Bag/Given 02/14/23 0628)    ED Course/ Medical Decision Making/ A&P                                 Medical Decision Making Amount and/or Complexity of Data Reviewed Labs: ordered. Radiology: ordered.  Risk Prescription drug management.   Differential diagnosis considered includes, but not limited to: Stroke; TIA; seizure; intracranial hemorrhage  Arrives as a code stroke, seen in conjunction  with Dr. Derry Lory, neurology.  Patient with rapidly improving symptoms at arrival to the ED.  Patient felt to have had a seizure.  He does have prior strokes and history of seizure disorder.  CT head with subdural hematoma.  Neurology recommends Keppra 1000 mg IV and increasing his daily dosing to 750 twice daily.  Discussed with neurosurgery, they will see the patient in the ED and make determination on treatment of the subdural.  CRITICAL CARE Performed by: Gilda Crease   Total critical care time: 35 minutes  Critical care time was exclusive of separately billable procedures and treating other patients.  Critical care was necessary to treat or prevent imminent or life-threatening deterioration.  Critical care was time spent personally by me on the following activities: development of treatment plan with patient and/or surrogate as well as nursing, discussions with consultants, evaluation of patient's response to treatment, examination of patient, obtaining history from patient or surrogate, ordering and performing treatments and interventions, ordering and review of laboratory studies, ordering and review of radiographic studies, pulse oximetry and re-evaluation of patient's condition.          Final Clinical Impression(s) / ED Diagnoses Final diagnoses:  Seizure (HCC)  SDH (subdural hematoma) (HCC)    Rx / DC Orders ED Discharge Orders     None         Modine Oppenheimer, Canary Brim, MD 02/14/23 [phone]

## 2023-02-14 NOTE — ED Notes (Signed)
Report given to icu at this time.

## 2023-02-14 NOTE — ED Triage Notes (Signed)
Patient from [facility], Hx of CKD, HTN, TIA, Seizures. Found by staff with left sided facial droop and Left arm and Leg weakness (more than normal) Last seen normal around 4am.

## 2023-02-14 NOTE — Code Documentation (Signed)
Stroke Response Nurse Documentation Code Documentation  Dennis Levine is a 72 y.o. male arriving to [facility]  via [facility] on 11/5 with past medical hx of advanced dementia, HTN, stroke, seizures. On No antithrombotic. Code stroke was activated by EMS.   Patient from SNF where he was LKW at 2200 and now complaining of Left sided weakness.   Stroke team at the bedside on patient arrival. Labs drawn and patient cleared for CT by Dr. Bebe Shaggy. Patient to CT with team. NIHSS 5, see documentation for details and code stroke times. Patient with disoriented, left facial droop, left arm weakness, left leg weakness, and Expressive aphasia  on exam. The following imaging was completed:  CT Head. Patient is not a candidate for IV Thrombolytic due to resolving symptoms. Patient is not a candidate for IR due to low suspicion for stroke.   Care Plan: Neuro checks q2 hrs.   Bedside handoff with ED RN Oswaldo Done.    Rose Fillers  Rapid Response RN

## 2023-02-14 NOTE — Anesthesia Procedure Notes (Signed)
Procedure Name: Intubation Date/Time: 02/14/2023 3:03 PM  Performed by: Loleta Zanyiah Posten, CRNAPre-anesthesia Checklist: Patient identified, Emergency Drugs available, Suction available and Patient being monitored Patient Re-evaluated:Patient Re-evaluated prior to induction Oxygen Delivery Method: Circle system utilized Preoxygenation: Pre-oxygenation with 100% oxygen Induction Type: IV induction Ventilation: Mask ventilation without difficulty and Oral airway inserted - appropriate to patient size Laryngoscope Size: Mac and 4 Grade View: Grade I Tube type: Oral Number of attempts: 1 Airway Equipment and Method: Stylet and Oral airway Placement Confirmation: ETT inserted through vocal cords under direct vision, positive ETCO2 and breath sounds checked- equal and bilateral Secured at: 24 cm Tube secured with: Tape Dental Injury: Teeth and Oropharynx as per pre-operative assessment  Comments: Intubation by SRNA C. Holmes under supervision by MD Jean Rosenthal

## 2023-02-14 NOTE — Anesthesia Preprocedure Evaluation (Signed)
Anesthesia Evaluation  Patient identified by MRN, date of birth, ID band Patient awake    Reviewed: Allergy & Precautions, H&P , NPO status , Patient's Chart, lab work & pertinent test results  Airway Mallampati: II  TM Distance: >3 FB Neck ROM: Full    Dental no notable dental hx.    Pulmonary neg pulmonary ROS   Pulmonary exam normal breath sounds clear to auscultation       Cardiovascular hypertension, Normal cardiovascular exam Rhythm:Regular Rate:Normal     Neuro/Psych Seizures -,  PSYCHIATRIC DISORDERS     Dementia Subdural hematoma CVA    GI/Hepatic negative GI ROS,,,(+) Hepatitis -  Endo/Other  negative endocrine ROS    Renal/GU Renal disease  negative genitourinary   Musculoskeletal negative musculoskeletal ROS (+)    Abdominal   Peds negative pediatric ROS (+)  Hematology negative hematology ROS (+)   Anesthesia Other Findings   Reproductive/Obstetrics negative OB ROS                              Anesthesia Physical Anesthesia Plan  ASA: 3  Anesthesia Plan: General   Post-op Pain Management:    Induction: Intravenous  PONV Risk Score and Plan: Ondansetron and Dexamethasone  Airway Management Planned: Oral ETT  Additional Equipment:   Intra-op Plan:   Post-operative Plan: Extubation in OR  Informed Consent: I have reviewed the patients History and Physical, chart, labs and discussed the procedure including the risks, benefits and alternatives for the proposed anesthesia with the patient or authorized representative who has indicated his/her understanding and acceptance.     Dental advisory given  Plan Discussed with: CRNA  Anesthesia Plan Comments: (Phone consent obtained with niece Marilynn Latino at 14:11 on 02/14/23)         Anesthesia Quick Evaluation

## 2023-02-14 NOTE — Op Note (Signed)
02/14/2023  4:27 PM  PATIENT:  Dennis Levine  72 y.o. male  PRE-OPERATIVE DIAGNOSIS: Right subdural hematoma  POST-OPERATIVE DIAGNOSIS:  same  PROCEDURE: Right frontoparietal craniotomy for evacuation of subdural hematoma  SURGEON:  Marikay Alar, MD  ASSISTANTS: Verlin Dike, FNP  ANESTHESIA:   General  EBL: 200 ml  Total I/O In: 1050 [I.V.:700; IV Piggyback:350] Out: 420 [Urine:220; Blood:200]  BLOOD ADMINISTERED: none  DRAINS: 7 flat JP  SPECIMEN:  none  INDICATION FOR PROCEDURE: This patient presented with a seizure and left-sided weakness. Imaging showed a large right sided subacute to chronic subdural hematoma. Recommended right craniotomy for evacuation of the subdural hematoma. Patient understood the risks, benefits, and alternatives and potential outcomes and wished to proceed.  PROCEDURE DETAILS: The patient was taken to the operating room and after induction of adequate generalized endotracheal anesthesia, the head was affixed in a 3 point Mayfield head rest, and turned to the left to expose the right frontotemporal parietal region. The head was shaved and then cleaned and then prepped with DuraPrep and draped in the usual sterile fashion. 10 cc of local anesthetic was injected, and a lazy S curvilinear incision was made on the right of the head. Raney clips were placed to establish hemostasis of the scalp, the muscle was reflected with the scalp flap, to expose the right frontotemporal parietal region. A burr hole was placed, and a craniotomy flap was turned utilizing the high-speed, air powered drill. The flap was then placed in bacitracin-containing saline solution, and the dura was opened to expose a large underlying subacute to chronic subdural hematoma with thick membranes. A hematoma was then removed with a combination of irrigation and suction.  The membranes were fenestrated widely.  I continued to irrigate until the irrigant was clear to, and dried any bleeding  with bipolar cautery. I then placed a subdural drain through separate stab incision and close the dura with a running 4-0 Nurolon suture. Dural tack up sutures were placed. The dura was lined with Gelfoam, and the craniotomy flap was replaced with doggie-bone plates. The wound was copiously irrigated.  the galea was then closed with interrupted 2-0 Vicryl suture. The skin was then closed with staples a sterile dressing was applied. The patient was then taken out of the 3-point Mayfield headrest and awakened from general anesthesia, and transported to the recovery room in stable condition. At the end of the procedure all sponge, needle, and instrument counts were correct.    PLAN OF CARE: Admit to inpatient   PATIENT DISPOSITION:  PACU - hemodynamically stable.   Delay start of Pharmacological VTE agent (>24hrs) due to surgical blood loss or risk of bleeding:  yes

## 2023-02-14 NOTE — Consult Note (Addendum)
Reason for Consult:SDH Referring Physician: EDP  Dennis Levine is an 72 y.o. male.   HPI:  72 year old male presented to the ED this morning after being found slumped over at the nursing facility he resides at. Has a history of meningioma, MCA stroke, and seizure. He was altered when he came in but has since recovered to baseline.   Past Medical History:  Diagnosis Date   Cancer [facility])    prostate   Hepatitis    hepatitis c    Hypertension     Past Surgical History:  Procedure Laterality Date   4th index finger straightened  1990's   finger surgery for injury  1970's right hand   PELVIC LYMPH NODE DISSECTION N/A 02/03/2016   Procedure: PELVIC LYMPH NODE DISSECTION;  Surgeon: Crist Fat, MD;  Location: [facility] ORS;  Service: Urology;  Laterality: N/A;   ROBOT ASSISTED LAPAROSCOPIC RADICAL PROSTATECTOMY N/A 02/03/2016   Procedure: XI ROBOTIC ASSISTED LAPAROSCOPIC RADICAL PROSTATECTOMY;  Surgeon: Crist Fat, MD;  Location: [facility] ORS;  Service: Urology;  Laterality: N/A;    No Known Allergies  Social History   Tobacco Use   Smoking status: Never   Smokeless tobacco: Never  Substance Use Topics   Alcohol use: Not Currently    Comment: 6 pack beer most days    History reviewed. No pertinent family history.   Review of Systems  Positive ROS: as above  All other systems have been reviewed and were otherwise negative with the exception of those mentioned in the HPI and as above.  Objective: Vital signs in last 24 hours: Temp:  [97.5 F (36.4 C)] 97.5 F (36.4 C) (11/05 1610) Pulse Rate:  [78] 78 (11/05 0611) Resp:  [19] 19 (11/05 0606) BP: (108)/(79) 108/79 (11/05 0607) SpO2:  [99 %] 99 % (11/05 0612) Weight:  [68 kg] 68 kg (11/05 0609)  General Appearance: Alert, cooperative, no distress, appears stated age Head: Normocephalic, without obvious abnormality, atraumatic Eyes: PERRL, conjunctiva/corneas clear, EOM's intact, fundi benign, both eyes      Back:  Symmetric, no curvature, ROM normal, no CVA tenderness Lungs:  respirations unlabored Heart: Regular rate and rhythm Extremities: Extremities normal, atraumatic, no cyanosis or edema Pulses: 2+ and symmetric all extremities Skin: Skin color, texture, turgor normal, no rashes or lesions  NEUROLOGIC:   Mental status: A&O x2, no aphasia, good attention span, Memory and fund of knowledge impaired, advanced dementia Motor Exam - grossly normal, normal tone and bulk Sensory Exam - grossly normal Reflexes: symmetric, no pathologic reflexes, No Hoffman's, No clonus Coordination - grossly normal Gait - not tested Balance - not tested Cranial Nerves: I: smell Not tested  II: visual acuity  OS: na    OD: na  II: visual fields Full to confrontation  II: pupils Equal, round, reactive to light  III,VII: ptosis None  III,IV,VI: extraocular muscles  Full ROM  V: mastication Normal  V: facial light touch sensation  Normal  V,VII: corneal reflex  Present  VII: facial muscle function - upper  Normal  VII: facial muscle function - lower Normal  VIII: hearing Not tested  IX: soft palate elevation  Normal  IX,X: gag reflex Present  XI: trapezius strength  5/5  XI: sternocleidomastoid strength 5/5  XI: neck flexion strength  5/5  XII: tongue strength  Normal    Data Review Lab Results  Component Value Date   WBC 8.2 02/14/2023   HGB 16.3 02/14/2023   HCT 48.0 02/14/2023  MCV 85.2 02/14/2023   PLT 268 02/14/2023   Lab Results  Component Value Date   NA 138 02/14/2023   K 4.7 02/14/2023   CL 104 02/14/2023   CO2 21 (L) 02/14/2023   BUN 30 (H) 02/14/2023   CREATININE 1.70 (H) 02/14/2023   GLUCOSE 115 (H) 02/14/2023   Lab Results  Component Value Date   INR 1.1 02/14/2023    Radiology: CT HEAD CODE STROKE WO CONTRAST  Result Date: 02/14/2023 CLINICAL DATA:  Code stroke. 72 year old male with left side deficit. Prior stroke. EXAM: CT HEAD WITHOUT CONTRAST TECHNIQUE: Contiguous  axial images were obtained from the base of the skull through the vertex without intravenous contrast. RADIATION DOSE REDUCTION: This exam was performed according to the departmental dose-optimization program which includes automated exposure control, adjustment of the mA and/or kV according to patient size and/or use of iterative reconstruction technique. COMPARISON:  Brain MRI 10/08/2021.  Head CT 08/27/2022 and earlier. FINDINGS: Brain: Mixed density and lobulated right side subdural hematoma appears new since May. Multi septated appearance, and the collection measures up to 20 mm thickness on coronal image 37. But up to 30 mm along the anterior frontal convexity as seen on coronal image 47. Mass effect on the right hemisphere. Leftward midline shift is 6-7 mm. Superimposed round and homogeneous right posterosuperior convexity meningioma visible on series 4, image 27, about 2.6 cm diameter and not significantly changed from last year. Mass effect on the right lateral ventricle. No ventriculomegaly. Basilar cisterns remain patent. No cortically based acute infarct identified. Vascular: Calcified atherosclerosis at the skull base. No suspicious intracranial vascular hyperdensity. Skull: Stable and intact. Sinuses/Orbits: Visualized paranasal sinuses and mastoids are stable and well aerated. Other: Mild rightward gaze. Chronic right forehead benign scalp lipoma. ASPECTS [facility] Stroke Program Early CT Score) Total score (0-10 with 10 being normal): Not applicable, right side subdural hematoma. IMPRESSION: 1. Multiloculated, lobulated and mixed density Right Side Subdural Hematoma with maximal thickness of 20-30 mm. 2. Superimposed chronic right posterosuperior convexity Meningioma, about 26 mm diameter. 3. Intracranial mass effect with leftward midline shift of 6-7 mm. Basilar cisterns remain patent. 4. These results were communicated to Dr. Derry Lory at 6:10 am on 02/14/2023 by text page via the [facility] messaging  system. Electronically Signed   By: Odessa Fleming M.D.   On: 02/14/2023 06:11     Assessment/Plan: 72 year old male presented to the ED after being found slumped over at the nursing home. CT head shows a very large right SDH measuring 20mm with midline shift and some mass effect. Spoke with niece who is his primary contact. She is trying to decide on whether or not he would want surgery for the SDH. She has a medical appt this morning and then will come see him. We will speak with her again around noon. Tentatively on the schedule this afternoon for craniotomy for evacuation of SDH. Keep NPO, if needs admission prior to surgery would recommend CCM or hospitalist since we dont know if he will be aggressive care or comfort care at this point.    Tiana Loft [facility] 02/14/2023 7:49 AM

## 2023-02-14 NOTE — Consult Note (Signed)
NAME:  Dennis Levine, MRN:  [id number], DOB:  [birth year], LOS: 0 ADMISSION DATE:  02/14/2023, CONSULTATION DATE:  02/14/23 REFERRING MD:  Yetta Barre, CHIEF COMPLAINT:  weakness   History of Present Illness:  72 year old man w/ hx of advanced dementia, HTN, prior CVA, seizure d/o who was found down w/ L facial droop and L sided weakness from SNF.  He was found to have a large R subdural hematoma.  After GOC discussion and risks/benefits of surgery, patient underwent hematoma evacuation by Dr. Yetta Barre on 02/14/23.  PCCM will assist with postoperative management.  Seen in PACU.  No intra-op issues per nurse.  Pertinent  Medical History  Dementia HTN Seizure d/o on keppra HLD  Significant Hospital Events: Including procedures, antibiotic start and stop dates in addition to other pertinent events   11/4 admit 11/5 R frontoparietal craniotomy for evacuation of subdural hematoma; 200 cc evacuated initially  Interim History / Subjective:  Consult  Objective   Blood pressure (!) 123/99, pulse 67, temperature 97.7 F (36.5 C), temperature source Oral, resp. rate 18, height 5\' 11"  (1.803 m), weight 68 kg, SpO2 95%.        Intake/Output Summary (Last 24 hours) at 02/14/2023 1530 Last data filed at 02/14/2023 1119 Gross per 24 hour  Intake 200 ml  Output --  Net 200 ml   Filed Weights   02/14/23 0609 02/14/23 1316  Weight: 68 kg 68 kg    Examination: General: elderly man laying in bed HENT: tracking, pupils appear equal Lungs: clear, no accessory muscle use Cardiovascular: regular rate/rhythm, ext warm Abdomen: soft, +BS Extremities: no edema, +arthritic changes Neuro: Moves to command, a little bit weak on R GU: foley clear urine  Imaging and labs reviewed  Resolved Hospital Problem list   N/A  Assessment & Plan:  Acute presumed traumatic R subdural hematoma with brain compression s/p evacuation  Baseline advanced dementia  HTN, HLD  - Monitor JP output - Neurochecks - SBP  < 160 - Avoid hypothermia, acidemia - PT/OT/SLP evaluations - Will follow with you until out of ICU  Best Practice (right click and "Reselect all SmartList Selections" daily)   Diet/type: NPO DVT prophylaxis: SCD GI prophylaxis: N/A Lines: N/A Foley:  Yes, and it is still needed Code Status:  full code Last date of multidisciplinary goals of care discussion [per primary]  Labs   CBC: Recent Labs  Lab 02/14/23 0617 02/14/23 0654  WBC 8.2  --   NEUTROABS 6.2  --   HGB 13.7 16.3  HCT 44.3 48.0  MCV 85.2  --   PLT 268  --     Basic Metabolic Panel: Recent Labs  Lab 02/14/23 0617 02/14/23 0654  NA 136 138  K 4.6 4.7  CL 102 104  CO2 21*  --   GLUCOSE 122* 115*  BUN 26* 30*  CREATININE 1.66* 1.70*  CALCIUM 9.5  --    GFR: Estimated Creatinine Clearance: 37.2 mL/min (A) (by C-G formula based on SCr of 1.7 mg/dL (H)). Recent Labs  Lab 02/14/23 0617  WBC 8.2    Liver Function Tests: Recent Labs  Lab 02/14/23 0617  AST 30  ALT 32  ALKPHOS 92  BILITOT 1.8*  PROT 7.9  ALBUMIN 3.3*   No results for input(s): "LIPASE", "AMYLASE" in the last 168 hours. No results for input(s): "AMMONIA" in the last 168 hours.  ABG    Component Value Date/Time   PHART 7.357 03/24/2021 0455   PCO2ART 46.1 03/24/2021  0455   PO2ART 141 (H) 03/24/2021 0455   HCO3 25.7 03/24/2021 0455   TCO2 24 02/14/2023 0654   O2SAT 99.0 03/24/2021 0455     Coagulation Profile: Recent Labs  Lab 02/14/23 0617  INR 1.1    Cardiac Enzymes: No results for input(s): "CKTOTAL", "CKMB", "CKMBINDEX", "TROPONINI" in the last 168 hours.  HbA1C: Hgb A1c MFr Bld  Date/Time Value Ref Range Status  10/07/2021 06:31 PM 6.0 (H) 4.8 - 5.6 % Final    Comment:    (NOTE)         Prediabetes: 5.7 - 6.4         Diabetes: >6.4         Glycemic control for adults with diabetes: <7.0   03/23/2021 01:40 PM 6.2 (H) 4.8 - 5.6 % Final    Comment:    (NOTE) Pre diabetes:           5.7%-6.4%  Diabetes:              >6.4%  Glycemic control for   <7.0% adults with diabetes     CBG: No results for input(s): "GLUCAP" in the last 168 hours.  Review of Systems:   Denies pain, SOB, weakness, N/V/D, abd pain.  Past Medical History:  He,  has a past medical history of Cancer (HCC), Hepatitis, and Hypertension.   Surgical History:   Past Surgical History:  Procedure Laterality Date   4th index finger straightened  1990's   finger surgery for injury  1970's right hand   PELVIC LYMPH NODE DISSECTION N/A 02/03/2016   Procedure: PELVIC LYMPH NODE DISSECTION;  Surgeon: Crist Fat, MD;  Location: [facility];  Service: Urology;  Laterality: N/A;   ROBOT ASSISTED LAPAROSCOPIC RADICAL PROSTATECTOMY N/A 02/03/2016   Procedure: XI ROBOTIC ASSISTED LAPAROSCOPIC RADICAL PROSTATECTOMY;  Surgeon: Crist Fat, MD;  Location: [facility];  Service: Urology;  Laterality: N/A;     Social History:   reports that he has never smoked. He has never used smokeless tobacco. He reports that he does not currently use alcohol. He reports that he does not currently use drugs after having used the following drugs: Marijuana.   Family History:  His family history is not on file.   Allergies No Known Allergies   Home Medications  Prior to Admission medications   Medication Sig Start Date End Date Taking? Authorizing Provider  amLODipine (NORVASC) 5 MG tablet Take 5 mg by mouth daily. 01/03/23  Yes [provider]  amLODipine (NORVASC) 10 MG tablet Take 1 tablet (10 mg total) by mouth daily. 04/22/21   Medina-Vargas, Monina C, NP  aspirin EC 81 MG tablet Take 1 tablet (81 mg total) by mouth daily. Swallow whole. 10/12/21   Barnetta Chapel, MD  atorvastatin (LIPITOR) 40 MG tablet Take 1 tablet (40 mg total) by mouth daily. 10/12/21 11/11/21  Barnetta Chapel, MD  feeding supplement (ENSURE ENLIVE / ENSURE PLUS) LIQD Take 237 mLs by mouth 3 (three) times daily between meals.  04/13/21   Elgergawy, Leana Roe, MD  folic acid (FOLVITE) 1 MG tablet Take 1 tablet (1 mg total) by mouth daily. 04/22/21   Medina-Vargas, Monina C, NP  levETIRAcetam (KEPPRA) 500 MG tablet Take 1 tablet (500 mg total) by mouth 2 (two) times daily. 04/22/21   Medina-Vargas, Monina C, NP  metoprolol tartrate (LOPRESSOR) 25 MG tablet Take 1 tablet (25 mg total) by mouth 2 (two) times daily. Patient not taking: Reported on 10/07/2021 04/22/21  Medina-Vargas, Monina C, NP  Multiple Vitamin (MULTIVITAMIN WITH MINERALS) TABS tablet Take 1 tablet by mouth daily. 04/13/21   Elgergawy, Leana Roe, MD  polyethylene glycol (MIRALAX / GLYCOLAX) 17 g packet Take 17 g by mouth daily as needed for moderate constipation. 03/30/21   Burnadette Pop, MD     Critical care time: N/A

## 2023-02-14 NOTE — Plan of Care (Signed)
Patient admitted s/p code stroke. Patient arrived to unit, remains alert and oriented to self. Patient taken to OR for craniotomy, returned back to unit. Q1 neuro assessments and NIH screenings performed, see chart. Patient and family updated in plan of care. ICU status maintained.   Problem: Education: Goal: Knowledge of the prescribed therapeutic regimen Outcome: Progressing Goal: Knowledge of disease or condition will improve Outcome: Progressing   Problem: Clinical Measurements: Goal: Neurologic status will improve Outcome: Progressing   Problem: Tissue Perfusion: Goal: Ability to maintain intracranial pressure will improve Outcome: Progressing   Problem: Respiratory: Goal: Will regain and/or maintain adequate ventilation Outcome: Progressing   Problem: Skin Integrity: Goal: Risk for impaired skin integrity will decrease Outcome: Progressing Goal: Demonstration of wound healing without infection will improve Outcome: Progressing   Problem: Psychosocial: Goal: Ability to verbalize positive feelings about self will improve Outcome: Progressing Goal: Ability to participate in self-care as condition permits will improve Outcome: Progressing Goal: Ability to identify appropriate support needs will improve Outcome: Progressing   Problem: Health Behavior/Discharge Planning: Goal: Ability to manage health-related needs will improve Outcome: Progressing   Problem: Nutritional: Goal: Risk of aspiration will decrease Outcome: Progressing Goal: Dietary intake will improve Outcome: Progressing   Problem: Communication: Goal: Ability to communicate needs accurately will improve Outcome: Progressing   Problem: Education: Goal: Knowledge of the prescribed therapeutic regimen will improve Outcome: Progressing   Problem: Clinical Measurements: Goal: Usual level of consciousness will be regained or maintained. Outcome: Progressing Goal: Neurologic status will improve Outcome:  Progressing Goal: Ability to maintain intracranial pressure will improve Outcome: Progressing   Problem: Skin Integrity: Goal: Demonstration of wound healing without infection will improve Outcome: Progressing

## 2023-02-14 NOTE — H&P (Signed)
Defer to "consult note" for H&P

## 2023-02-14 NOTE — Transfer of Care (Signed)
Immediate Anesthesia Transfer of Care Note  Patient: Starling Manns  Procedure(s) Performed: CRANIOTOMY HEMATOMA EVACUATION SUBDURAL (Right)  Patient Location: PACU  Anesthesia Type:General  Level of Consciousness: drowsy and responds to stimulation  Airway & Oxygen Therapy: Patient Spontanous Breathing and Patient connected to face mask oxygen  Post-op Assessment: Report given to RN, Post -op Vital signs reviewed and stable, and Patient moving all extremities X 4  Post vital signs: Reviewed and stable  Last Vitals:  Vitals Value Taken Time  BP 111/75 02/14/23 1634  Temp    Pulse 77 02/14/23 1636  Resp 18   SpO2 98 % 02/14/23 1636  Vitals shown include unfiled device data.  Last Pain:  Vitals:   02/14/23 1316  TempSrc: Oral  PainSc:          Complications: No notable events documented.

## 2023-02-14 NOTE — Procedures (Signed)
Patient Name: Dennis Levine  MRN: [id number]  Epilepsy Attending: Charlsie Quest  Referring Physician/Provider: Erick Blinks, MD  Date: 02/14/2023 Duration: 22.37 mins  Patient history: 72 y.o. male  with hx of advanced dementia, HTN, prior strokes, seizures on keppra who was last seen at his baseline at 2200 and then weaker overnight and then leaning on his left and unable to move his left side in early AM today. Found to have a large mixed density R SDH with mass effect and leftward midline shift. His left sided weakness is rapidly improving.  EEG to evaluate for seizure.  Level of alertness: Awake, drowsy  AEDs during EEG study: LEV  Technical aspects: This EEG study was done with scalp electrodes positioned according to the 10-20 International system of electrode placement. Electrical activity was reviewed with band pass filter of 1-70Hz , sensitivity of 7 uV/mm, display speed of 22mm/sec with a 60Hz  notched filter applied as appropriate. EEG data were recorded continuously and digitally stored.  Video monitoring was available and reviewed as appropriate.  Description: The posterior dominant rhythm consists of 8 Hz activity of moderate voltage (25-35 uV) seen predominantly in posterior head regions, symmetric and reactive to eye opening and eye closing. Drowsiness was characterized by attenuation of the posterior background rhythm.  Continuous 3 to 5 Hz theta-delta  slowing was noted in right frontotemporal region.  Frequent spikes were noted in right frontotemporal region.  Hyperventilation and photic stimulation were not performed.     ABNORMALITY -Spike, right frontotemporal region -Continuous slow, right frontotemporal region  IMPRESSION: This study showed evidence of epileptogenicity and cortical dysfunction arising from right frontotemporal region likely secondary to underlying SDH.  No seizures were seen throughout the recording.    [name] Annabelle Harman

## 2023-02-14 NOTE — Consult Note (Signed)
NEUROLOGY CONSULT NOTE   Date of service: February 14, 2023 Patient Name: Dennis Levine MRN:  [id number] DOB:  Nov 04, 1950 Chief Complaint: "L sided weakness, stroke code" Requesting Provider: Gilda Crease, *  History of Present Illness  Dennis Levine is a 72 y.o. male with hx of advanced dementia, HTN, prior strokes, seizures on keppra who was last seen at his baseline at 2200. Staff helped him get up in the middle of the night to use the bathroom and noted that he seemed very weak. Around 0445, they found him leaning on his left side with a L facial droop and LUE and LLE weakness.  EMS called and he was brought in as a code stroke. He was noted to be completely flaccid for EMS and gradually improved enroute. Upon arrival to the ED oriented to self, slight left facial droop and mild L sided weakness that is continuing to improve.  LKW: 2200 on 02/13/23. Modified rankin score: 3-Moderate disability-requires help but walks WITHOUT assistance IV Thrombolysis: not offered due to SDH and prior hx of hemorrhagic transformation of stroke. EVT: not offered, low suspicion for LVO.   ROS  Unable to ascertain due to advanced dementia.  Past History   Past Medical History:  Diagnosis Date   Cancer [facility])    prostate   Hepatitis    hepatitis c    Hypertension     Past Surgical History:  Procedure Laterality Date   4th index finger straightened  1990's   finger surgery for injury  1970's right hand   PELVIC LYMPH NODE DISSECTION N/A 02/03/2016   Procedure: PELVIC LYMPH NODE DISSECTION;  Surgeon: Crist Fat, MD;  Location: [facility];  Service: Urology;  Laterality: N/A;   ROBOT ASSISTED LAPAROSCOPIC RADICAL PROSTATECTOMY N/A 02/03/2016   Procedure: XI ROBOTIC ASSISTED LAPAROSCOPIC RADICAL PROSTATECTOMY;  Surgeon: Crist Fat, MD;  Location: [facility];  Service: Urology;  Laterality: N/A;    Family History: History reviewed. No pertinent family history.  Social  History  reports that he has never smoked. He has never used smokeless tobacco. He reports that he does not currently use alcohol. He reports that he does not currently use drugs after having used the following drugs: Marijuana.  No Known Allergies  Medications   Current Facility-Administered Medications:    levETIRAcetam (KEPPRA) IVPB 1000 mg/100 mL premix, 1,000 mg, Intravenous, Once, Pollina, Canary Brim, MD  Current Outpatient Medications:    amLODipine (NORVASC) 10 MG tablet, Take 1 tablet (10 mg total) by mouth daily., Disp: 30 tablet, Rfl: 0   aspirin EC 81 MG tablet, Take 1 tablet (81 mg total) by mouth daily. Swallow whole., Disp: 30 tablet, Rfl: 12   atorvastatin (LIPITOR) 40 MG tablet, Take 1 tablet (40 mg total) by mouth daily., Disp: 30 tablet, Rfl: 0   feeding supplement (ENSURE ENLIVE / ENSURE PLUS) LIQD, Take 237 mLs by mouth 3 (three) times daily between meals., Disp: 237 mL, Rfl: 12   folic acid (FOLVITE) 1 MG tablet, Take 1 tablet (1 mg total) by mouth daily., Disp: 30 tablet, Rfl: 0   levETIRAcetam (KEPPRA) 500 MG tablet, Take 1 tablet (500 mg total) by mouth 2 (two) times daily., Disp: 60 tablet, Rfl: 0   metoprolol tartrate (LOPRESSOR) 25 MG tablet, Take 1 tablet (25 mg total) by mouth 2 (two) times daily. (Patient not taking: Reported on 10/07/2021), Disp: 60 tablet, Rfl: 0   Multiple Vitamin (MULTIVITAMIN WITH MINERALS) TABS tablet, Take 1 tablet by mouth  daily., Disp: , Rfl:    polyethylene glycol (MIRALAX / GLYCOLAX) 17 g packet, Take 17 g by mouth daily as needed for moderate constipation., Disp: 14 each, Rfl: 0  Vitals   Vitals:   02/14/23 0607 02/14/23 0609 02/14/23 0611 02/14/23 0612  BP: 108/79     Pulse:   78   Resp:      Temp:   (!) 97.5 F (36.4 C)   TempSrc:   Oral   SpO2:    99%  Weight:  68 kg    Height:  5\' 11"  (1.803 m)      Body mass index is 20.91 kg/m.  Physical Exam   Constitutional: Appears well-developed and well-nourished.   Psych: Affect appropriate to situation.  Eyes: No scleral injection.  HENT: No OP obstruction.  Head: Normocephalic.  Cardiovascular: Normal rate and regular rhythm.  Respiratory: Effort normal, non-labored breathing.  GI: Soft.  No distension. There is no tenderness.  Skin: WDI.   Neurologic Examination  Mental status/Cognition: Alert, oriented to self only, poor attention. Speech/language: dysarthric speech, fluent, comprehension intact, object naming intact, Cranial nerves:   CN II Pupils equal and reactive to light, no VF deficits   CN III,IV,VI EOM intact, no gaze preference or deviation, no nystagmus    CN V normal sensation in V1, V2, and V3 segments bilaterally    CN VII Mild L facial droop   CN VIII normal hearing to speech    CN IX & X normal palatal elevation, no uvular deviation    CN XI 5/5 head turn and 5/5 shoulder shrug bilaterally    CN XII midline tongue protrusion    Motor:  Muscle bulk: poor, tone normal, pronator drift none tremor none Mvmt Root Nerve  Muscle Right Left Comments  SA C5/6 Ax Deltoid 5 4   EF C5/6 Mc Biceps 5 4   EE C6/7/8 Rad Triceps 5 4   WF C6/7 Med FCR     WE C7/8 PIN ECU     F Ab C8/T1 U ADM/FDI 5 4   HF L1/2/3 Fem Illopsoas 5 4   KE L2/3/4 Fem Quad     DF L4/5 D Peron Tib Ant 5 4   PF S1/2 Tibial Grc/Sol 5 4    Sensation:  Light touch Intact throughout   Pin prick    Temperature    Vibration   Proprioception    Coordination/Complex Motor:  - Finger to Nose intact BL - Heel to shin unable to get him to do - Rapid alternating movement are slowed on the left. - Gait: deferred for patient safety.   Labs/Imaging/Neurodiagnostic studies   CBC: No results for input(s): "WBC", "NEUTROABS", "HGB", "HCT", "MCV", "PLT" in the last 168 hours.  Basic Metabolic Panel:  Lab Results  Component Value Date   NA 138 12/05/2021   K 4.2 12/05/2021   CO2 21 (L) 12/05/2021   GLUCOSE 146 (H) 12/05/2021   BUN 18 12/05/2021   CREATININE  0.76 12/05/2021   CALCIUM 9.2 12/05/2021   GFRNONAA >60 12/05/2021   GFRAA >60 02/03/2016    Lipid Panel:  Lab Results  Component Value Date   LDLCALC 106 (H) 10/08/2021    HgbA1c:  Lab Results  Component Value Date   HGBA1C 6.0 (H) 10/07/2021    Urine Drug Screen:     Component Value Date/Time   LABOPIA NONE DETECTED 10/08/2021 1128   COCAINSCRNUR NONE DETECTED 10/08/2021 1128   LABBENZ NONE DETECTED 10/08/2021 1128  AMPHETMU NONE DETECTED 10/08/2021 1128   THCU POSITIVE (A) 10/08/2021 1128   LABBARB NONE DETECTED 10/08/2021 1128     Alcohol Level     Component Value Date/Time   ETH <10 10/07/2020 1239    INR  Lab Results  Component Value Date   INR 1.0 03/23/2021    APTT  Lab Results  Component Value Date   APTT 23 (L) 03/23/2021    AED levels: No results found for: "PHENYTOIN", "ZONISAMIDE", "LAMOTRIGINE", "LEVETIRACETA"    CT Head without contrast(Personally reviewed): Large mixed density R SDH with mass effect and leftward midline shift.  Neurodiagnostics rEEG:  pending  Impression   DOMNIQUE VANTINE is a 72 y.o. male  with hx of advanced dementia, HTN, prior strokes, seizures on keppra who was last seen at his baseline at 2200 and then weaker overnight and then leaning on his left and unable to move his left side in early AM today. Found to have a large mixed density R SDH with mass effect and leftward midline shift. His left sided weakness is rapidly improving.  I suspect that he likely had a seizure and had post ictal todds paralysis on the left which is rapidly improving.  Recommendations  - Keppra 1000mg  IV once - increase maintenance Keppra to 750 BID. - rEEG. - workup and management of SDH per neurosurgery team. ______________________________________________________________________    Welton Flakes [facility]

## 2023-02-14 NOTE — Progress Notes (Signed)
PT Cancellation Note  Patient Details Name: Dennis Levine MRN: [id number] DOB: Dec 09, 1950   Cancelled Treatment:    Reason Eval/Treat Not Completed: Medical issues which prohibited therapy. PT received imminent discharge order however pt is preparing to go for craniotomy per RN. PT will follow up after surgery is complete and pt is medically stable.   Arlyss Gandy 02/14/2023, 12:52 PM

## 2023-02-14 NOTE — Progress Notes (Signed)
EEG complete - results pending 

## 2023-02-15 ENCOUNTER — Inpatient Hospital Stay (HOSPITAL_COMMUNITY): Payer: 59

## 2023-02-15 DIAGNOSIS — S065XAA Traumatic subdural hemorrhage with loss of consciousness status unknown, initial encounter: Secondary | ICD-10-CM | POA: Diagnosis not present

## 2023-02-15 MED ORDER — LEVETIRACETAM 500 MG PO TABS
500.0000 mg | ORAL_TABLET | Freq: Two times a day (BID) | ORAL | Status: DC
  Administered 2023-02-15 – 2023-02-16 (×3): 500 mg via ORAL
  Filled 2023-02-15 (×3): qty 1

## 2023-02-15 MED ORDER — LEVETIRACETAM IN NACL 500 MG/100ML IV SOLN: 500.0000 mg | Freq: Two times a day (BID) | INTRAVENOUS | Status: DC

## 2023-02-15 NOTE — Progress Notes (Signed)
Afternoon rounds. He is alert. Baseline mental status. No changes today. Continuing supportive care. Drain management per NSGY. Getting pt/ot/slp. Will continue to follow him while in ICU.

## 2023-02-15 NOTE — Progress Notes (Signed)
NEUROLOGY CONSULT FOLLOW UP NOTE   Date of service: February 15, 2023 Patient Name: Dennis Levine MRN:  [id number] DOB:  [birth year]  Brief HPI  Dennis Levine is a 72 y.o. male with hx of advanced dementia, HTN, prior strokes, seizures on keppra who was last seen at his baseline at 2200 on 11/5. Staff helped him get up in the middle of the night to use the bathroom and noted that he seemed very weak. Around 0445, they found him leaning on his left side with a L facial droop and LUE and LLE weakness.   EMS called and he was brought in as a code stroke. He was noted to be completely flaccid for EMS and gradually improved enroute. Upon arrival to the ED oriented to self, slight left facial droop and mild L sided weakness that is continuing to improve. CT head shows a very large right SDH measuring 20mm with midline shift and some mass effect. He was taken for a right craniotomy with evacuation of the SDH.   Interval Hx/subjective   Sitting up eating breakfast, does have some difficulty with fine motor movements in his hands, but able to eat independently.   Vitals   Vitals:   02/15/23 0500 02/15/23 0600 02/15/23 0700 02/15/23 0800  BP: (!) 147/94 (!) 148/91 (!) 140/95   Pulse: 63 60 60   Resp: 20 (!) 22 (!) 24   Temp:    97.6 F (36.4 C)  TempSrc:    Oral  SpO2: 98% 97% 93%   Weight:      Height:         Body mass index is 20.91 kg/m.  Physical Exam   Constitutional: Appears well-developed and well-nourished.  Psych: Affect appropriate to situation.  Eyes: No scleral injection.  HENT: No OP obstrucion.  Head: Normocephalic.  Cardiovascular: Normal rate and regular rhythm.  Respiratory: Effort normal, non-labored breathing.  GI: Soft.  No distension. There is no tenderness.  Skin: Bandage on the right right of the head with JP drain in place  Neurologic Examination   Mental Status: Patient is awake, alert, oriented to self and states he is in the hospital. States it is  January 2001.  Does not remember why he is in the hospital, states he is doing "just fine" Cranial Nerves: II: Visual Fields are full. Pupils are equal, round, and reactive to light.   III,IV, VI: EOMI without ptosis or diploplia.  V: Facial sensation is symmetric to temperature VII: left facial asymmetry  VIII: Hearing is intact to voice X: Palate elevates symmetrically XI: Shoulder shrug is symmetric. XII: Tongue protrudes midline without atrophy or fasciculations.  Motor: Tone is increased in the bilateral upper extremities. Bulk is poor.  Holds bilateral lower extremities antigravity without drift.  Bilateral  Sensory: Localizes to painful stimuli in all extremities  Cerebellar: FNF slow, but not ataxic Gait deferred for safety        Labs and Diagnostic Imaging   CBC:  Recent Labs  Lab 02/14/23 0617 02/14/23 0654  WBC 8.2  --   NEUTROABS 6.2  --   HGB 13.7 16.3  HCT 44.3 48.0  MCV 85.2  --   PLT 268  --     Basic Metabolic Panel:  Lab Results  Component Value Date   NA 138 02/14/2023   K 4.7 02/14/2023   CO2 21 (L) 02/14/2023   GLUCOSE 115 (H) 02/14/2023   BUN 30 (H) 02/14/2023   CREATININE 1.70 (H) 02/14/2023  CALCIUM 9.5 02/14/2023   GFRNONAA 43 (L) 02/14/2023   GFRAA >60 02/03/2016   Lipid Panel:  Lab Results  Component Value Date   LDLCALC 106 (H) 10/08/2021   HgbA1c:  Lab Results  Component Value Date   HGBA1C 6.0 (H) 10/07/2021   Urine Drug Screen:     Component Value Date/Time   LABOPIA NONE DETECTED 02/14/2023 1754   COCAINSCRNUR NONE DETECTED 02/14/2023 1754   LABBENZ NONE DETECTED 02/14/2023 1754   AMPHETMU NONE DETECTED 02/14/2023 1754   THCU NONE DETECTED 02/14/2023 1754   LABBARB NONE DETECTED 02/14/2023 1754    Alcohol Level     Component Value Date/Time   ETH <10 02/14/2023 0617   INR  Lab Results  Component Value Date   INR 1.1 02/14/2023   APTT  Lab Results  Component Value Date   APTT 25 02/14/2023   AED  levels: No results found for: "PHENYTOIN", "ZONISAMIDE", "LAMOTRIGINE", "LEVETIRACETA"  CT Head without contrast(Personally reviewed): 1. Regressed right side subdural hematoma on postoperative day 1. Residual blood products approximately 6 mm in thickness, with postoperative gas now occupying the larger right anterior convexity subdural space. Superimposed chronic right parietal convexity Meningioma. 2. Resolved midline shift.  No new intracranial abnormality.  rEEG:  11/5- This study showed evidence of epileptogenicity and cortical dysfunction arising from right frontotemporal region likely secondary to underlying SDH.  No seizures were seen throughout the recording.   Impression   Dennis Levine is a 72 y.o. male hx of advanced dementia, HTN, prior strokes, seizures on keppra who was last seen at his baseline at 2200 on 11/5. Staff helped him get up in the middle of the night to use the bathroom and noted that he seemed very weak. Around 0445, they found him leaning on his left side with a L facial droop and LUE and LLE weakness. CT head shows a very large right SDH measuring 20mm with midline shift and some mass effect. He was taken for a right craniotomy with evacuation of the SDH. Neurology was consulted for management of seizures. He is currently well controlled on 500mg  Keppra BID. He is currently alert and oriented, moving all extremities well. Recommend post operative management per neurosurgery.  Recommendations  - Continue to monitor for seizure, seizure precautions. - Post operative management per neurosurgery - Continue Keppra 500mg  BID  - Neurology will sign off, please call neurology consult team with any concerns ______________________________________________________________________   Thank you for the opportunity to take part in the care of this patient. If you have any further questions, please contact the neurology consultation team on call. Updated oncall schedule is listed  on AMION.  Signed,  Elmer Picker

## 2023-02-15 NOTE — Progress Notes (Signed)
PT Cancellation Note  Patient Details Name: Dennis Levine MRN: [id number] DOB: 06/18/1950   Cancelled Treatment:    Reason Eval/Treat Not Completed: Active bedrest order - will check back as medically appropriate.  Marye Round, PT DPT Acute Rehabilitation Services Secure Chat Preferred  Office [phone]    Truddie Coco 02/15/2023, 7:23 AM

## 2023-02-15 NOTE — Anesthesia Postprocedure Evaluation (Signed)
Anesthesia Post Note  Patient: Dennis Levine  Procedure(s) Performed: CRANIOTOMY HEMATOMA EVACUATION SUBDURAL (Right)     Patient location during evaluation: PACU Anesthesia Type: General Level of consciousness: awake and alert Pain management: pain level controlled Vital Signs Assessment: post-procedure vital signs reviewed and stable Respiratory status: spontaneous breathing, nonlabored ventilation, respiratory function stable and patient connected to nasal cannula oxygen Cardiovascular status: blood pressure returned to baseline and stable Postop Assessment: no apparent nausea or vomiting Anesthetic complications: no   No notable events documented.  Last Vitals:  Vitals:   02/15/23 0900 02/15/23 1100  BP: (!) 146/89 132/74  Pulse: 62 70  Resp: 19 (!) 21  Temp:    SpO2: 97% 96%    Last Pain:  Vitals:   02/15/23 1100  TempSrc:   PainSc: 0-No pain                 [place]

## 2023-02-15 NOTE — Progress Notes (Signed)
Subjective: Patient reports doing really well  Objective: Vital signs in last 24 hours: Temp:  [97.5 F (36.4 C)-97.9 F (36.6 C)] 97.8 F (36.6 C) (11/06 1200) Pulse Rate:  [55-76] 62 (11/06 1400) Resp:  [13-24] 24 (11/06 1400) BP: (111-151)/(70-118) 115/70 (11/06 1400) SpO2:  [93 %-100 %] 94 % (11/06 1400)  Intake/Output from previous day: 11/05 0701 - 11/06 0700 In: 1817.5 [P.O.:240; I.V.:1127.5; IV Piggyback:450] Out: 1695 [Urine:1330; Drains:165; Blood:200] Intake/Output this shift: Total I/O In: 312.6 [P.O.:200; I.V.:112.6] Out: 105 [Urine:105]  Neurologic: Grossly normal  Lab Results: Lab Results  Component Value Date   WBC 8.2 02/14/2023   HGB 16.3 02/14/2023   HCT 48.0 02/14/2023   MCV 85.2 02/14/2023   PLT 268 02/14/2023   Lab Results  Component Value Date   INR 1.1 02/14/2023   BMET Lab Results  Component Value Date   NA 138 02/14/2023   K 4.7 02/14/2023   CL 104 02/14/2023   CO2 21 (L) 02/14/2023   GLUCOSE 115 (H) 02/14/2023   BUN 30 (H) 02/14/2023   CREATININE 1.70 (H) 02/14/2023   CALCIUM 9.5 02/14/2023    Studies/Results: CT HEAD WO CONTRAST  Result Date: 02/15/2023 CLINICAL DATA:  72 year old male code stroke presentation yesterday with left side deficit and multiloculated appearing right side subdural hematoma. Status post craniotomy and surgical evacuation of subdural blood postoperative day 1. EXAM: CT HEAD WITHOUT CONTRAST TECHNIQUE: Contiguous axial images were obtained from the base of the skull through the vertex without intravenous contrast. RADIATION DOSE REDUCTION: This exam was performed according to the departmental dose-optimization program which includes automated exposure control, adjustment of the mA and/or kV according to patient size and/or use of iterative reconstruction technique. COMPARISON:  Head CT 0557 hours yesterday. FINDINGS: Brain: Postoperative changes with right subdural drain in place. Pneumocephalus, now occupying  most of the anterior right subdural space which was abnormal yesterday. Residual mid right hemisphere mixed density subdural hematoma is 6 mm in thickness at most levels (coronal image 43). Superimposed round, oval right posterosuperior convexity meningioma on coronal image 53. Regressed intracranial mass effect. Midline shift now largely resolved. Mild residual mass effect on the right lateral ventricle. No ventriculomegaly. No new areas of intracranial hemorrhage. Stable gray-white matter differentiation throughout the brain. No acute cortically based infarct. Normal basilar cisterns. Vascular: Extensive Calcified atherosclerosis at the skull base. Skull: New right superolateral craniotomy. Sinuses/Orbits: Visualized paranasal sinuses and mastoids are stable and well aerated. Other: Postoperative changes to the right scalp. Orbits soft tissues appears stable. IMPRESSION: 1. Regressed right side subdural hematoma on postoperative day 1. Residual blood products approximately 6 mm in thickness, with postoperative gas now occupying the larger right anterior convexity subdural space. Superimposed chronic right parietal convexity Meningioma. 2. Resolved midline shift.  No new intracranial abnormality. Electronically Signed   By: Odessa Fleming M.D.   On: 02/15/2023 06:29   EEG adult  Result Date: 02/14/2023 Charlsie Quest, MD     02/14/2023 10:09 AM Patient Name: Dennis Levine MRN: [id number] Epilepsy Attending: Charlsie Quest Referring Physician/Provider: Erick Blinks, MD Date: 02/14/2023 Duration: 22.37 mins Patient history: 72 y.o. male  with hx of advanced dementia, HTN, prior strokes, seizures on keppra who was last seen at his baseline at 2200 and then weaker overnight and then leaning on his left and unable to move his left side in early AM today. Found to have a large mixed density R SDH with mass effect and leftward midline shift. His left sided  weakness is rapidly improving.  EEG to evaluate for  seizure. Level of alertness: Awake, drowsy AEDs during EEG study: LEV Technical aspects: This EEG study was done with scalp electrodes positioned according to the 10-20 International system of electrode placement. Electrical activity was reviewed with band pass filter of 1-70Hz , sensitivity of 7 uV/mm, display speed of 47mm/sec with a 60Hz  notched filter applied as appropriate. EEG data were recorded continuously and digitally stored.  Video monitoring was available and reviewed as appropriate. Description: The posterior dominant rhythm consists of 8 Hz activity of moderate voltage (25-35 uV) seen predominantly in posterior head regions, symmetric and reactive to eye opening and eye closing. Drowsiness was characterized by attenuation of the posterior background rhythm.  Continuous 3 to 5 Hz theta-delta  slowing was noted in right frontotemporal region.  Frequent spikes were noted in right frontotemporal region.  Hyperventilation and photic stimulation were not performed.   ABNORMALITY -Spike, right frontotemporal region -Continuous slow, right frontotemporal region IMPRESSION: This study showed evidence of epileptogenicity and cortical dysfunction arising from right frontotemporal region likely secondary to underlying SDH.  No seizures were seen throughout the recording. Charlsie Quest   CT HEAD CODE STROKE WO CONTRAST  Result Date: 02/14/2023 CLINICAL DATA:  Code stroke. 72 year old male with left side deficit. Prior stroke. EXAM: CT HEAD WITHOUT CONTRAST TECHNIQUE: Contiguous axial images were obtained from the base of the skull through the vertex without intravenous contrast. RADIATION DOSE REDUCTION: This exam was performed according to the departmental dose-optimization program which includes automated exposure control, adjustment of the mA and/or kV according to patient size and/or use of iterative reconstruction technique. COMPARISON:  Brain MRI 10/08/2021.  Head CT 08/27/2022 and earlier. FINDINGS:  Brain: Mixed density and lobulated right side subdural hematoma appears new since May. Multi septated appearance, and the collection measures up to 20 mm thickness on coronal image 37. But up to 30 mm along the anterior frontal convexity as seen on coronal image 47. Mass effect on the right hemisphere. Leftward midline shift is 6-7 mm. Superimposed round and homogeneous right posterosuperior convexity meningioma visible on series 4, image 27, about 2.6 cm diameter and not significantly changed from last year. Mass effect on the right lateral ventricle. No ventriculomegaly. Basilar cisterns remain patent. No cortically based acute infarct identified. Vascular: Calcified atherosclerosis at the skull base. No suspicious intracranial vascular hyperdensity. Skull: Stable and intact. Sinuses/Orbits: Visualized paranasal sinuses and mastoids are stable and well aerated. Other: Mild rightward gaze. Chronic right forehead benign scalp lipoma. ASPECTS [facility] Stroke Program Early CT Score) Total score (0-10 with 10 being normal): Not applicable, right side subdural hematoma. IMPRESSION: 1. Multiloculated, lobulated and mixed density Right Side Subdural Hematoma with maximal thickness of 20-30 mm. 2. Superimposed chronic right posterosuperior convexity Meningioma, about 26 mm diameter. 3. Intracranial mass effect with leftward midline shift of 6-7 mm. Basilar cisterns remain patent. 4. These results were communicated to Dr. Derry Lory at 6:10 am on 02/14/2023 by text page via the [facility] messaging system. Electronically Signed   By: Odessa Fleming M.D.   On: 02/14/2023 06:11    Assessment/Plan: Postop day 1 crani for SDH. CT head improved. Doing a lot better from neuro standpoint. Will plan to d/c back to SNF tomorrow. Hemovac drain removed   LOS: 1 day    Tiana Loft [place] 02/15/2023, 2:55 PM

## 2023-02-15 NOTE — NC FL2 (Signed)
[place] MEDICAID FL2 LEVEL OF CARE FORM     IDENTIFICATION  Patient Name: Dennis Levine Birthdate: 12-03-1950 Sex: male Admission Date (Current Location): 02/14/2023  [facility] and [place] Number:  Producer, television/film/video and Address:  [facility], [address] [place], [place] [address]      Provider Number: [id number]  Attending Physician Name and Address:  Arman Bogus, MD  Relative Name and Phone Number:       Current Level of Care: Hospital Recommended Level of Care: Skilled Nursing Facility Prior Approval Number:    Date Approved/Denied:   PASRR Number: [id number]  Discharge Plan: SNF    Current Diagnoses: Patient Active Problem List   Diagnosis Date Noted   SDH (subdural hematoma) (HCC) 02/14/2023   S/P craniotomy 02/14/2023   Pain due to onychomycosis of toenails of both feet 12/24/2021   CVA (cerebral vascular accident) (HCC) 10/07/2021   Hypernatremia 10/07/2021   Generalized weakness 10/07/2021   Dementia (HCC) 04/23/2021   Benign essential HTN 04/17/2021   Meningioma (HCC) 04/16/2021   Alcohol abuse 04/16/2021   SARS-CoV-2 positive 04/16/2021   CKD (chronic kidney disease), symptom management only, stage 2 (mild) 04/16/2021   Hyponatremia 04/16/2021   Prediabetes 04/16/2021   Protein-calorie malnutrition, severe 03/26/2021   Seizures (HCC) 03/23/2021   Prostate cancer (HCC) 02/03/2016    Orientation RESPIRATION BLADDER Height & Weight     Self, Place  Normal Incontinent, External catheter Weight: 149 lb 14.6 oz (68 kg) Height:  5\' 11"  (180.3 cm)  BEHAVIORAL SYMPTOMS/MOOD NEUROLOGICAL BOWEL NUTRITION STATUS      Continent Diet (See dc summary)  AMBULATORY STATUS COMMUNICATION OF NEEDS Skin   Extensive Assist Verbally Normal                       Personal Care Assistance Level of Assistance  Bathing, Feeding, Dressing Bathing Assistance: Maximum assistance Feeding assistance: Limited assistance Dressing  Assistance: Maximum assistance     Functional Limitations Info             SPECIAL CARE FACTORS FREQUENCY                       Contractures Contractures Info: Not present    Additional Factors Info  Code Status, Allergies Code Status Info: Full Allergies Info: NKA           Current Medications (02/15/2023):  This is the current hospital active medication list Current Facility-Administered Medications  Medication Dose Route Frequency Provider Last Rate Last Admin   acetaminophen (TYLENOL) tablet 650 mg  650 mg Oral Q4H PRN Arman Bogus, MD       Or   acetaminophen (TYLENOL) suppository 650 mg  650 mg Rectal Q4H PRN Arman Bogus, MD       amLODipine (NORVASC) tablet 10 mg  10 mg Oral Daily Arman Bogus, MD   10 mg at 02/15/23 0945   Chlorhexidine Gluconate Cloth 2 % PADS 6 each  6 each Topical Daily Arman Bogus, MD   6 each at 02/15/23 1209   dexamethasone (DECADRON) injection 6 mg  6 mg Intravenous Q6H Arman Bogus, MD   6 mg at 02/15/23 0945   Followed by   dexamethasone (DECADRON) injection 4 mg  4 mg Intravenous Q6H Arman Bogus, MD       Followed by   Melene Muller ON 02/17/2023] dexamethasone (DECADRON) injection 4 mg  4  mg Intravenous Q8H Arman Bogus, MD       folic acid (FOLVITE) tablet 1 mg  1 mg Oral Daily Arman Bogus, MD   1 mg at 02/15/23 0945   HYDROcodone-acetaminophen (NORCO/VICODIN) 5-325 MG per tablet 1 tablet  1 tablet Oral Q4H PRN Arman Bogus, MD       labetalol (NORMODYNE) injection 10-40 mg  10-40 mg Intravenous Q10 min PRN Arman Bogus, MD       levETIRAcetam (KEPPRA) tablet 500 mg  500 mg Oral BID Cristopher Peru, PA-C   500 mg at 02/15/23 8413   Or   levETIRAcetam (KEPPRA) IVPB 500 mg/100 mL premix  500 mg Intravenous BID Cristopher Peru, PA-C       metoprolol tartrate (LOPRESSOR) tablet 25 mg  25 mg Oral BID Arman Bogus, MD   25 mg at 02/15/23 0944   morphine (PF) 2 MG/ML injection  1-2 mg  1-2 mg Intravenous Q2H PRN Arman Bogus, MD       ondansetron [facility]) tablet 4 mg  4 mg Oral Q4H PRN Arman Bogus, MD       Or   ondansetron [facility]) injection 4 mg  4 mg Intravenous Q4H PRN Arman Bogus, MD       Oral care mouth rinse  15 mL Mouth Rinse PRN Arman Bogus, MD       promethazine [facility]) tablet 12.5-25 mg  12.5-25 mg Oral Q4H PRN Arman Bogus, MD       senna [facility]) tablet 8.6 mg  1 tablet Oral BID Arman Bogus, MD   8.6 mg at 02/15/23 0945     Discharge Medications: Please see discharge summary for a list of discharge medications.  Relevant Imaging Results:  Relevant Lab Results:   Additional Information SS# [id number]  Mearl Latin, LCSW

## 2023-02-15 NOTE — TOC Initial Note (Signed)
Transition of Care [facility]) - Initial/Assessment Note    Patient Details  Name: Dennis Levine MRN: [id number] Date of Birth: 1950-08-26  [facility] CM/SW Contact:    Mearl Latin, LCSW Phone Number: 02/15/2023, 9:11 AM  Clinical Narrative:                 Patient was admitted from [facility] under long term care. CSW will continue to follow for needs.    Expected Discharge Plan: Skilled Nursing Facility Barriers to Discharge: Continued Medical Work up   Patient Goals and CMS Choice            Expected Discharge Plan and Services In-house Referral: Clinical Social Work   Post Acute Care Choice: Skilled Nursing Facility Living arrangements for the past 2 months: Skilled Nursing Facility                                      Prior Living Arrangements/Services Living arrangements for the past 2 months: Skilled Nursing Facility Lives with:: Facility Resident Patient language and need for interpreter reviewed:: Yes Do you feel safe going back to the place where you live?: Yes      Need for Family Participation in Patient Care: Yes (Comment) Care giver support system in place?: Yes (comment)   Criminal Activity/Legal Involvement Pertinent to Current Situation/Hospitalization: No - Comment as needed  Activities of Daily Living   ADL Screening (condition at time of admission) Independently performs ADLs?: No Does the patient have a NEW difficulty with bathing/dressing/toileting/self-feeding that is expected to last >3 days?: No Does the patient have a NEW difficulty with getting in/out of bed, walking, or climbing stairs that is expected to last >3 days?: No Does the patient have a NEW difficulty with communication that is expected to last >3 days?: No Is the patient deaf or have difficulty hearing?: No Does the patient have difficulty seeing, even when wearing glasses/contacts?: No Does the patient have difficulty concentrating, remembering, or  making decisions?: Yes  Permission Sought/Granted Permission sought to share information with : Facility Medical sales representative, Family Supports Permission granted to share information with : No  Share Information with NAME: West Pugh  Permission granted to share info w AGENCY: [facility]  Permission granted to share info w Relationship: Niece  Permission granted to share info w Contact Information: [phone]  Emotional Assessment Appearance:: Appears stated age Attitude/Demeanor/Rapport: Unable to Assess Affect (typically observed): Unable to Assess Orientation: : Oriented to Self Alcohol / Substance Use: Not Applicable Psych Involvement: No (comment)  Admission diagnosis:  Seizure (HCC) [R56.9] SDH (subdural hematoma) (HCC) [S06.5XAA] S/P craniotomy [Z98.890] Patient Active Problem List   Diagnosis Date Noted   SDH (subdural hematoma) (HCC) 02/14/2023   S/P craniotomy 02/14/2023   Pain due to onychomycosis of toenails of both feet 12/24/2021   CVA (cerebral vascular accident) (HCC) 10/07/2021   Hypernatremia 10/07/2021   Generalized weakness 10/07/2021   Dementia (HCC) 04/23/2021   Benign essential HTN 04/17/2021   Meningioma (HCC) 04/16/2021   Alcohol abuse 04/16/2021   SARS-CoV-2 positive 04/16/2021   CKD (chronic kidney disease), symptom management only, stage 2 (mild) 04/16/2021   Hyponatremia 04/16/2021   Prediabetes 04/16/2021   Protein-calorie malnutrition, severe 03/26/2021   Seizures (HCC) 03/23/2021   Prostate cancer (HCC) 02/03/2016   PCP:  Karna Dupes, MD Pharmacy:   [facility]) [place], [place] [address] [facility] [address] [place] [address] [place] [address] Phone: [phone] Fax: [phone]  [facility] [address] [place]), [place] - [address] [place] [address] Phone: [phone] Fax: [phone]  [facility] - [place], [place] - [address] [place] [address] Phone: [phone] Fax: [phone]     Social Determinants of Health (SDOH) Social History: SDOH Screenings   Tobacco Use: Low Risk  (02/14/2023)   SDOH Interventions:     Readmission Risk Interventions     No data to display

## 2023-02-15 NOTE — Progress Notes (Addendum)
When this RN came on at 1100, JP drain looked as if it had been pulled out some and it was not holding a charge. RN paged MD who stated they will be coming up to remove the drain today.   Harley Alto, RN

## 2023-02-15 NOTE — Progress Notes (Addendum)
RN contacted on call NeuroSurg PA, Emilee Hero.  PA made aware about JP drain in [place] not holding a charge and quickly filling with air.   PA stated as long as Neuro exam stays the same, leave drain in place for Dr. Yetta Barre to access in the morning.

## 2023-02-15 NOTE — Progress Notes (Signed)
SLP Cancellation Note  Patient Details Name: Dennis Levine MRN: [id number] DOB: [birth year]   Cancelled treatment:       Reason Eval/Treat Not Completed: SLP screened, no needs identified, will sign off. Patient passed yale and is tolerating diet well per RN. Note that on a previous admission, patient was on dysphagia 3 solids per his request but was without evidence if dysphagia or aspiration. Could downgrade to dysphagia 3 if patient preferred. Will defer formal evaluation at this time.   Ferdinand Lango MA, CCC-SLP    Dennis Levine 02/15/2023, 11:52 AM

## 2023-02-15 NOTE — Progress Notes (Signed)
NAME:  Dennis Levine, MRN:  [id number], DOB:  1950/11/04, LOS: 1 ADMISSION DATE:  02/14/2023, CONSULTATION DATE:  02/14/23 REFERRING MD:  Yetta Barre, CHIEF COMPLAINT:  weakness   History of Present Illness:  72 year old man with past medical history of advanced dementia, hypertension, prior stroke, seizure disorder who was found down with left facial droop and left sided weakness at his SNF. He was found to have large RIGHT subdural hematoma. After GOC discussion and risks/benefits of surgery, patient underwent hematoma evacuation by Dr. Yetta Barre on 02/14/2023. PCCM was consulted to assist with post-operative course.   Pertinent  Medical History  advanced dementia, hypertension, prior stroke, seizure disorder  Significant Hospital Events: Including procedures, antibiotic start and stop dates in addition to other pertinent events   11/4: admit 11/5: R frontoparietal craniotomy for evacuation of subdural hematoma; 200 cc evacuated initially 11/6: okay this AM. Still only 200 from JP drain   Interim History / Subjective:  Subjectively no complaints this AM. Denies HA, changes to vision, pain. 200 total from JP drain so far. Nursing with concern that JP drain is not holding suction.   Objective   Blood pressure (!) 148/91, pulse 60, temperature 97.6 F (36.4 C), temperature source Oral, resp. rate (!) 22, height 5\' 11"  (1.803 m), weight 68 kg, SpO2 97%.        Intake/Output Summary (Last 24 hours) at 02/15/2023 0711 Last data filed at 02/15/2023 0600 Gross per 24 hour  Intake 1747.58 ml  Output 1695 ml  Net 52.58 ml   Filed Weights   02/14/23 0609 02/14/23 1316  Weight: 68 kg 68 kg    Examination: General: elderly male laying in bed in no acute distress  HENT: EOMI, PERRLA, anicteric sclera  Lungs: clear bilaterally, on room air  Cardiovascular: s1/s2 without murmur, rub, gallop  Abdomen: soft, +BS Extremities: no edema, +arthritic changes Neuro: awake, alert, oriented to self which is  baseline. Weak in LUE  GU: foley clear urine  Imaging and labs reviewed  Resolved Hospital Problem list   N/A  Assessment & Plan:  Acute presumed traumatic RIGHT subdural hematoma with brain compression s/p evacuation by Dr. Yetta Barre on 02/14/23. EEG negative. - monitor JP output - 200 so far. Issues with suction holding on JP, will let NSGY know  - decadron taper per nsgy  - decreased Keppra to 500 BID dosing as negative EEG - neurochecks  - SBP goal <160, which he is maintaining  - avoid hypothermia, acidemia  - PT/OT/SLP  Hypertension; goal <160 per neuro  - con't metoprolol 25mg  daily  - amlodipine 10mg   - labetalol PRN  Baseline advanced dementia  Best Practice (right click and "Reselect all SmartList Selections" daily)   Diet/type: dysphagia diet (see orders) DVT prophylaxis: SCD GI prophylaxis: PPI Lines: N/A Foley:  Yes, and it is still needed Code Status:  full code Last date of multidisciplinary goals of care discussion [per primary]  Labs   CBC: Recent Labs  Lab 02/14/23 0617 02/14/23 0654  WBC 8.2  --   NEUTROABS 6.2  --   HGB 13.7 16.3  HCT 44.3 48.0  MCV 85.2  --   PLT 268  --     Basic Metabolic Panel: Recent Labs  Lab 02/14/23 0617 02/14/23 0654  NA 136 138  K 4.6 4.7  CL 102 104  CO2 21*  --   GLUCOSE 122* 115*  BUN 26* 30*  CREATININE 1.66* 1.70*  CALCIUM 9.5  --    GFR:  Estimated Creatinine Clearance: 37.2 mL/min (A) (by C-G formula based on SCr of 1.7 mg/dL (H)). Recent Labs  Lab 02/14/23 0617  WBC 8.2    Liver Function Tests: Recent Labs  Lab 02/14/23 0617  AST 30  ALT 32  ALKPHOS 92  BILITOT 1.8*  PROT 7.9  ALBUMIN 3.3*   No results for input(s): "LIPASE", "AMYLASE" in the last 168 hours. No results for input(s): "AMMONIA" in the last 168 hours.  ABG    Component Value Date/Time   PHART 7.357 03/24/2021 0455   PCO2ART 46.1 03/24/2021 0455   PO2ART 141 (H) 03/24/2021 0455   HCO3 25.7 03/24/2021 0455   TCO2 24  02/14/2023 0654   O2SAT 99.0 03/24/2021 0455     Coagulation Profile: Recent Labs  Lab 02/14/23 0617  INR 1.1    Cardiac Enzymes: No results for input(s): "CKTOTAL", "CKMB", "CKMBINDEX", "TROPONINI" in the last 168 hours.  HbA1C: Hgb A1c MFr Bld  Date/Time Value Ref Range Status  10/07/2021 06:31 PM 6.0 (H) 4.8 - 5.6 % Final    Comment:    (NOTE)         Prediabetes: 5.7 - 6.4         Diabetes: >6.4         Glycemic control for adults with diabetes: <7.0   03/23/2021 01:40 PM 6.2 (H) 4.8 - 5.6 % Final    Comment:    (NOTE) Pre diabetes:          5.7%-6.4%  Diabetes:              >6.4%  Glycemic control for   <7.0% adults with diabetes     CBG: No results for input(s): "GLUCAP" in the last 168 hours.  Review of Systems:   Denies pain, headache, other complaints   Past Medical History:  He,  has a past medical history of Cancer (HCC), Hepatitis, and Hypertension.   Surgical History:   Past Surgical History:  Procedure Laterality Date   4th index finger straightened  1990's   finger surgery for injury  1970's right hand   PELVIC LYMPH NODE DISSECTION N/A 02/03/2016   Procedure: PELVIC LYMPH NODE DISSECTION;  Surgeon: Crist Fat, MD;  Location: [facility];  Service: Urology;  Laterality: N/A;   ROBOT ASSISTED LAPAROSCOPIC RADICAL PROSTATECTOMY N/A 02/03/2016   Procedure: XI ROBOTIC ASSISTED LAPAROSCOPIC RADICAL PROSTATECTOMY;  Surgeon: Crist Fat, MD;  Location: [facility];  Service: Urology;  Laterality: N/A;     Social History:   reports that he has never smoked. He has never used smokeless tobacco. He reports that he does not currently use alcohol. He reports that he does not currently use drugs after having used the following drugs: Marijuana.   Family History:  His family history is not on file.   Allergies No Known Allergies   Home Medications  Prior to Admission medications   Medication Sig Start Date End Date Taking? Authorizing Provider   amLODipine (NORVASC) 5 MG tablet Take 5 mg by mouth daily. 01/03/23  Yes [provider]  amLODipine (NORVASC) 10 MG tablet Take 1 tablet (10 mg total) by mouth daily. 04/22/21   Medina-Vargas, Monina C, NP  aspirin EC 81 MG tablet Take 1 tablet (81 mg total) by mouth daily. Swallow whole. 10/12/21   Barnetta Chapel, MD  atorvastatin (LIPITOR) 40 MG tablet Take 1 tablet (40 mg total) by mouth daily. 10/12/21 11/11/21  Barnetta Chapel, MD  feeding supplement (ENSURE ENLIVE /  ENSURE PLUS) LIQD Take 237 mLs by mouth 3 (three) times daily between meals. 04/13/21   Elgergawy, Leana Roe, MD  folic acid (FOLVITE) 1 MG tablet Take 1 tablet (1 mg total) by mouth daily. 04/22/21   Medina-Vargas, Monina C, NP  levETIRAcetam (KEPPRA) 500 MG tablet Take 1 tablet (500 mg total) by mouth 2 (two) times daily. 04/22/21   Medina-Vargas, Monina C, NP  metoprolol tartrate (LOPRESSOR) 25 MG tablet Take 1 tablet (25 mg total) by mouth 2 (two) times daily. Patient not taking: Reported on 10/07/2021 04/22/21   Medina-Vargas, Monina C, NP  Multiple Vitamin (MULTIVITAMIN WITH MINERALS) TABS tablet Take 1 tablet by mouth daily. 04/13/21   Elgergawy, Leana Roe, MD  polyethylene glycol (MIRALAX / GLYCOLAX) 17 g packet Take 17 g by mouth daily as needed for moderate constipation. 03/30/21   Burnadette Pop, MD     Critical care time: NA    [facility] 02/15/2023, 8:02 AM  Please see [email or web address] for pager details.  From 7A-7P if no response, please call [phone]. After hours, please call ELink [phone].

## 2023-02-15 NOTE — Progress Notes (Signed)
OT Cancellation Note  Patient Details Name: Dennis Levine MRN: [id number] DOB: [birth year]   Cancelled Treatment:    Reason Eval/Treat Not Completed: Active bedrest order--will re-attempt eval once bedrest off and schedule allows. Lindon Romp OT Acute Rehabilitation Services Office [phone]    Evette Georges 02/15/2023, 7:34 AM

## 2023-02-16 ENCOUNTER — Encounter (HOSPITAL_COMMUNITY): Payer: Self-pay | Admitting: Neurological Surgery

## 2023-02-16 LAB — BASIC METABOLIC PANEL
Anion gap: 10 (ref 5–15)
BUN: 18 mg/dL (ref 8–23)
CO2: 23 mmol/L (ref 22–32)
Calcium: 9.2 mg/dL (ref 8.9–10.3)
Chloride: 104 mmol/L (ref 98–111)
Creatinine, Ser: 0.7 mg/dL (ref 0.61–1.24)
GFR, Estimated: >60 mL/min (ref >60)
Glucose, Bld: 212 mg/dL — ABNORMAL HIGH (ref 70–99)
Potassium: 4.2 mmol/L (ref 3.5–5.1)
Sodium: 137 mmol/L (ref 135–145)

## 2023-02-16 LAB — CBC
HCT: 37.9 % — ABNORMAL LOW (ref 39.0–52.0)
Hemoglobin: 12.1 g/dL — ABNORMAL LOW (ref 13.0–17.0)
MCH: 26.1 pg (ref 26.0–34.0)
MCHC: 31.9 g/dL (ref 30.0–36.0)
MCV: 81.9 fL (ref 80.0–100.0)
Platelets: 271 10*3/uL (ref 150–400)
RBC: 4.63 MIL/uL (ref 4.22–5.81)
RDW: 13.4 % (ref 11.5–15.5)
WBC: 14.6 10*3/uL — ABNORMAL HIGH (ref 4.0–10.5)
nRBC: 0 % (ref 0.0–0.2)

## 2023-02-16 MED ORDER — TAMSULOSIN HCL 0.4 MG PO CAPS
0.4000 mg | ORAL_CAPSULE | Freq: Every day | ORAL | Status: DC
  Administered 2023-02-16: 0.4 mg via ORAL
  Filled 2023-02-16: qty 1

## 2023-02-16 NOTE — Progress Notes (Signed)
02/16/2023 Patient doing okay, some possible urinary retention starting flomax. PT to work with patient, if unable to void may need coude. Dispo pending urinary issues.  Myrla Halsted MD PCCM

## 2023-02-16 NOTE — TOC Transition Note (Signed)
Transition of Care [facility]) - CM/SW Discharge Note   Patient Details  Name: Dennis Levine MRN: [id number] Date of Birth: 07-11-1950  Transition of Care [facility]) CM/SW Contact:  Mearl Latin, LCSW Phone Number: 02/16/2023, 12:02 PM   Clinical Narrative:    Patient will DC to: [facility] Anticipated DC date: 02/16/23 Family notified: Son and niece Transport by: Sharin Mons   Per MD patient ready for DC to [place]. RN to call report prior to discharge [phone] room 107B). RN, patient, patient's family, and facility notified of DC. Discharge Summary and FL2 sent to facility. DC packet on chart. Ambulance transport requested for patient.   CSW will sign off for now as social work intervention is no longer needed. Please consult [place] again if new needs arise.     Final next level of care: Skilled Nursing Facility Barriers to Discharge: Barriers Resolved   Patient Goals and CMS Choice CMS Medicare.gov Compare Post Acute Care list provided to:: Patient Represenative (must comment)    Discharge Placement     Existing PASRR number confirmed : 02/16/23          Patient [facility] at: [facility] Patient to be transferred to facility by: [facility] Name of family member notified: Son and niece Patient and family notified of of transfer: 02/16/23  Discharge Plan and Services Additional resources added to the After Visit Summary for   In-house Referral: Clinical Social Work   Post Acute Care Choice: Skilled Nursing Facility                               Social Determinants of Health (SDOH) Interventions SDOH Screenings   Tobacco Use: Low Risk  (02/14/2023)     Readmission Risk Interventions     No data to display

## 2023-02-16 NOTE — TOC Progression Note (Signed)
Transition of Care [facility]) - Progression Note    Patient Details  Name: Dennis Levine MRN: [id number] Date of Birth: 03-12-1951  [facility] CM/SW Contact  Mearl Latin, LCSW Phone Number: 02/16/2023, 9:11 AM  Clinical Narrative:    Lacinda Axon ready for patient to return today. CSW updated patient's niece and son. They reported agreement with [facility] for transport.    Expected Discharge Plan: Skilled Nursing Facility Barriers to Discharge: Barriers Resolved  Expected Discharge Plan and Services In-house Referral: Clinical Social Work   Post Acute Care Choice: Skilled Nursing Facility Living arrangements for the past 2 months: Skilled Nursing Facility Expected Discharge Date: 02/16/23                                     Social Determinants of Health (SDOH) Interventions SDOH Screenings   Tobacco Use: Low Risk  (02/14/2023)    Readmission Risk Interventions     No data to display

## 2023-02-16 NOTE — Discharge Summary (Signed)
Physician Discharge Summary  Patient ID: Dennis Levine MRN: [id number] DOB/AGE: [birth year] 72 y.o.  Admit date: 02/14/2023 Discharge date: 02/16/2023  Admission Diagnoses: Right subdural hematoma     Discharge Diagnoses: same   Discharged Condition: good  Hospital Course: The patient was admitted on 02/14/2023 and taken to the operating room where the patient underwent Right craniotomy for SDH. The patient tolerated the procedure well and was taken to the recovery room and then to the ICU in stable condition. The hospital course was routine. There were no complications. The wound remained clean dry and intact. Pt had appropriate headaches. No complaints of new pain or new N/T/W. The patient remained afebrile with stable vital signs, and tolerated a regular diet. The patient continued to increase activities, and pain was well controlled with oral pain medications.   Consults: None  Significant Diagnostic Studies:  Results for orders placed or performed during the hospital encounter of 02/14/23  MRSA Next Gen by PCR, Nasal   Specimen: Nasal Mucosa; Nasal Swab  Result Value Ref Range   MRSA by PCR Next Gen NOT DETECTED NOT DETECTED  Protime-INR  Result Value Ref Range   Prothrombin Time 14.3 11.4 - 15.2 seconds   INR 1.1 0.8 - 1.2  APTT  Result Value Ref Range   aPTT 25 24 - 36 seconds  CBC  Result Value Ref Range   WBC 8.2 4.0 - 10.5 K/uL   RBC 5.20 4.22 - 5.81 MIL/uL   Hemoglobin 13.7 13.0 - 17.0 g/dL   HCT 02.7 25.3 - 66.4 %   MCV 85.2 80.0 - 100.0 fL   MCH 26.3 26.0 - 34.0 pg   MCHC 30.9 30.0 - 36.0 g/dL   RDW 40.3 47.4 - 25.9 %   Platelets 268 150 - 400 K/uL   nRBC 0.0 0.0 - 0.2 %  Differential  Result Value Ref Range   Neutrophils Relative % 76 %   Neutro Abs 6.2 1.7 - 7.7 K/uL   Lymphocytes Relative 14 %   Lymphs Abs 1.1 0.7 - 4.0 K/uL   Monocytes Relative 9 %   Monocytes Absolute 0.7 0.1 - 1.0 K/uL   Eosinophils Relative 0 %   Eosinophils Absolute 0.0 0.0 -  0.5 K/uL   Basophils Relative 0 %   Basophils Absolute 0.0 0.0 - 0.1 K/uL   Immature Granulocytes 1 %   Abs Immature Granulocytes 0.04 0.00 - 0.07 K/uL  Comprehensive metabolic panel  Result Value Ref Range   Sodium 136 135 - 145 mmol/L   Potassium 4.6 3.5 - 5.1 mmol/L   Chloride 102 98 - 111 mmol/L   CO2 21 (L) 22 - 32 mmol/L   Glucose, Bld 122 (H) 70 - 99 mg/dL   BUN 26 (H) 8 - 23 mg/dL   Creatinine, Ser 5.63 (H) 0.61 - 1.24 mg/dL   Calcium 9.5 8.9 - 87.5 mg/dL   Total Protein 7.9 6.5 - 8.1 g/dL   Albumin 3.3 (L) 3.5 - 5.0 g/dL   AST 30 15 - 41 U/L   ALT 32 0 - 44 U/L   Alkaline Phosphatase 92 38 - 126 U/L   Total Bilirubin 1.8 (H) <1.2 mg/dL   GFR, Estimated 43 (L) >60 mL/min   Anion gap 13 5 - 15  Ethanol  Result Value Ref Range   Alcohol, Ethyl (B) <10 <10 mg/dL  Urinalysis, Routine w reflex microscopic -Urine, Catheterized  Result Value Ref Range   Color, Urine YELLOW YELLOW   APPearance CLEAR  CLEAR   Specific Gravity, Urine 1.016 1.005 - 1.030   pH 5.0 5.0 - 8.0   Glucose, UA NEGATIVE NEGATIVE mg/dL   Hgb urine dipstick SMALL (A) NEGATIVE   Bilirubin Urine NEGATIVE NEGATIVE   Ketones, ur 5 (A) NEGATIVE mg/dL   Protein, ur NEGATIVE NEGATIVE mg/dL   Nitrite NEGATIVE NEGATIVE   Leukocytes,Ua NEGATIVE NEGATIVE   RBC / HPF 6-10 0 - 5 RBC/hpf   WBC, UA 0-5 0 - 5 WBC/hpf   Bacteria, UA RARE (A) NONE SEEN   Squamous Epithelial / HPF 0-5 0 - 5 /HPF   Mucus PRESENT   Urine rapid drug screen (hosp performed)  Result Value Ref Range   Opiates NONE DETECTED NONE DETECTED   Cocaine NONE DETECTED NONE DETECTED   Benzodiazepines NONE DETECTED NONE DETECTED   Amphetamines NONE DETECTED NONE DETECTED   Tetrahydrocannabinol NONE DETECTED NONE DETECTED   Barbiturates NONE DETECTED NONE DETECTED  Basic metabolic panel  Result Value Ref Range   Sodium 137 135 - 145 mmol/L   Potassium 4.2 3.5 - 5.1 mmol/L   Chloride 104 98 - 111 mmol/L   CO2 23 22 - 32 mmol/L   Glucose, Bld  212 (H) 70 - 99 mg/dL   BUN 18 8 - 23 mg/dL   Creatinine, Ser 8.65 0.61 - 1.24 mg/dL   Calcium 9.2 8.9 - 78.4 mg/dL   GFR, Estimated >69 >62 mL/min   Anion gap 10 5 - 15  CBC  Result Value Ref Range   WBC 14.6 (H) 4.0 - 10.5 K/uL   RBC 4.63 4.22 - 5.81 MIL/uL   Hemoglobin 12.1 (L) 13.0 - 17.0 g/dL   HCT 95.2 (L) 84.1 - 32.4 %   MCV 81.9 80.0 - 100.0 fL   MCH 26.1 26.0 - 34.0 pg   MCHC 31.9 30.0 - 36.0 g/dL   RDW 40.1 02.7 - 25.3 %   Platelets 271 150 - 400 K/uL   nRBC 0.0 0.0 - 0.2 %  I-stat chem 8, ED  Result Value Ref Range   Sodium 138 135 - 145 mmol/L   Potassium 4.7 3.5 - 5.1 mmol/L   Chloride 104 98 - 111 mmol/L   BUN 30 (H) 8 - 23 mg/dL   Creatinine, Ser 6.64 (H) 0.61 - 1.24 mg/dL   Glucose, Bld 403 (H) 70 - 99 mg/dL   Calcium, Ion 4.74 1.15 - 1.40 mmol/L   TCO2 24 22 - 32 mmol/L   Hemoglobin 16.3 13.0 - 17.0 g/dL   HCT 25.9 56.3 - 87.5 %  Type and screen [facility] today.  Result Value Ref Range   ABO/RH(D) B POS    Antibody Screen NEG    Sample Expiration      02/17/2023,2359 Performed at [name], [address] [place], [place] [address]   ABO/Rh  Result Value Ref Range   ABO/RH(D)      B POS Performed at [facility], [address] [place], [place] [address]     CT HEAD WO CONTRAST  Result Date: 02/15/2023 CLINICAL DATA:  72 year old male code stroke presentation yesterday with left side deficit and multiloculated appearing right side subdural hematoma. Status post craniotomy and surgical evacuation of subdural blood postoperative day 1. EXAM: CT HEAD WITHOUT CONTRAST TECHNIQUE: Contiguous axial images were obtained from the base of the skull through the vertex without intravenous contrast. RADIATION DOSE REDUCTION: This exam was performed according to the departmental dose-optimization program  which includes automated exposure control, adjustment of the mA and/or kV according to patient size and/or use of  iterative reconstruction technique. COMPARISON:  Head CT 0557 hours yesterday. FINDINGS: Brain: Postoperative changes with right subdural drain in place. Pneumocephalus, now occupying most of the anterior right subdural space which was abnormal yesterday. Residual mid right hemisphere mixed density subdural hematoma is 6 mm in thickness at most levels (coronal image 43). Superimposed round, oval right posterosuperior convexity meningioma on coronal image 53. Regressed intracranial mass effect. Midline shift now largely resolved. Mild residual mass effect on the right lateral ventricle. No ventriculomegaly. No new areas of intracranial hemorrhage. Stable gray-white matter differentiation throughout the brain. No acute cortically based infarct. Normal basilar cisterns. Vascular: Extensive Calcified atherosclerosis at the skull base. Skull: New right superolateral craniotomy. Sinuses/Orbits: Visualized paranasal sinuses and mastoids are stable and well aerated. Other: Postoperative changes to the right scalp. Orbits soft tissues appears stable. IMPRESSION: 1. Regressed right side subdural hematoma on postoperative day 1. Residual blood products approximately 6 mm in thickness, with postoperative gas now occupying the larger right anterior convexity subdural space. Superimposed chronic right parietal convexity Meningioma. 2. Resolved midline shift.  No new intracranial abnormality. Electronically Signed   By: Odessa Fleming M.D.   On: 02/15/2023 06:29   EEG adult  Result Date: 02/14/2023 Charlsie Quest, MD     02/14/2023 10:09 AM Patient Name: Dennis Levine MRN: [id number] Epilepsy Attending: Charlsie Quest Referring Physician/Provider: Erick Blinks, MD Date: 02/14/2023 Duration: 22.37 mins Patient history: 72 y.o. male  with hx of advanced dementia, HTN, prior strokes, seizures on keppra who was last seen at his baseline at 2200 and then weaker overnight and then leaning on his left and unable to move his left  side in early AM today. Found to have a large mixed density R SDH with mass effect and leftward midline shift. His left sided weakness is rapidly improving.  EEG to evaluate for seizure. Level of alertness: Awake, drowsy AEDs during EEG study: LEV Technical aspects: This EEG study was done with scalp electrodes positioned according to the 10-20 International system of electrode placement. Electrical activity was reviewed with band pass filter of 1-70Hz , sensitivity of 7 uV/mm, display speed of 83mm/sec with a 60Hz  notched filter applied as appropriate. EEG data were recorded continuously and digitally stored.  Video monitoring was available and reviewed as appropriate. Description: The posterior dominant rhythm consists of 8 Hz activity of moderate voltage (25-35 uV) seen predominantly in posterior head regions, symmetric and reactive to eye opening and eye closing. Drowsiness was characterized by attenuation of the posterior background rhythm.  Continuous 3 to 5 Hz theta-delta  slowing was noted in right frontotemporal region.  Frequent spikes were noted in right frontotemporal region.  Hyperventilation and photic stimulation were not performed.   ABNORMALITY -Spike, right frontotemporal region -Continuous slow, right frontotemporal region IMPRESSION: This study showed evidence of epileptogenicity and cortical dysfunction arising from right frontotemporal region likely secondary to underlying SDH.  No seizures were seen throughout the recording. Charlsie Quest   CT HEAD CODE STROKE WO CONTRAST  Result Date: 02/14/2023 CLINICAL DATA:  Code stroke. 72 year old male with left side deficit. Prior stroke. EXAM: CT HEAD WITHOUT CONTRAST TECHNIQUE: Contiguous axial images were obtained from the base of the skull through the vertex without intravenous contrast. RADIATION DOSE REDUCTION: This exam was performed according to the departmental dose-optimization program which includes automated exposure control,  adjustment of the mA and/or kV according  to patient size and/or use of iterative reconstruction technique. COMPARISON:  Brain MRI 10/08/2021.  Head CT 08/27/2022 and earlier. FINDINGS: Brain: Mixed density and lobulated right side subdural hematoma appears new since May. Multi septated appearance, and the collection measures up to 20 mm thickness on coronal image 37. But up to 30 mm along the anterior frontal convexity as seen on coronal image 47. Mass effect on the right hemisphere. Leftward midline shift is 6-7 mm. Superimposed round and homogeneous right posterosuperior convexity meningioma visible on series 4, image 27, about 2.6 cm diameter and not significantly changed from last year. Mass effect on the right lateral ventricle. No ventriculomegaly. Basilar cisterns remain patent. No cortically based acute infarct identified. Vascular: Calcified atherosclerosis at the skull base. No suspicious intracranial vascular hyperdensity. Skull: Stable and intact. Sinuses/Orbits: Visualized paranasal sinuses and mastoids are stable and well aerated. Other: Mild rightward gaze. Chronic right forehead benign scalp lipoma. ASPECTS [facility] Stroke Program Early CT Score) Total score (0-10 with 10 being normal): Not applicable, right side subdural hematoma. IMPRESSION: 1. Multiloculated, lobulated and mixed density Right Side Subdural Hematoma with maximal thickness of 20-30 mm. 2. Superimposed chronic right posterosuperior convexity Meningioma, about 26 mm diameter. 3. Intracranial mass effect with leftward midline shift of 6-7 mm. Basilar cisterns remain patent. 4. These results were communicated to Dr. Derry Lory at 6:10 am on 02/14/2023 by text page via the [facility] messaging system. Electronically Signed   By: Odessa Fleming M.D.   On: 02/14/2023 06:11    Antibiotics:  Anti-infectives (From admission, onward)    Start     Dose/Rate Route Frequency Ordered Stop   02/14/23 2300  ceFAZolin (ANCEF) IVPB 1 g/50 mL premix        1  g 100 mL/hr over 30 Minutes Intravenous Every 8 hours 02/14/23 1740 02/15/23 0633   02/14/23 1446  ceFAZolin (ANCEF) 2-4 GM/100ML-% IVPB       Note to Pharmacy: Loleta Rose: cabinet override      02/14/23 1446 02/15/23 0259       Discharge Exam: Blood pressure 120/74, pulse 60, temperature 98.1 F (36.7 C), temperature source Oral, resp. rate 17, height 5\' 11"  (1.803 m), weight 68 kg, SpO2 96%. Neurologic: Grossly normal Ambulating and voiding well incision cdi   Discharge Medications:   Allergies as of 02/16/2023   No Known Allergies      Medication List     STOP taking these medications    aspirin 325 MG tablet   aspirin EC 81 MG tablet       TAKE these medications    acetaminophen 325 MG tablet Commonly known as: TYLENOL Take 650 mg by mouth every 4 (four) hours as needed for moderate pain (pain score 4-6).   amLODipine 10 MG tablet Commonly known as: NORVASC Take 1 tablet (10 mg total) by mouth daily.   atorvastatin 40 MG tablet Commonly known as: LIPITOR Take 1 tablet (40 mg total) by mouth daily.   B Complex-Biotin-FA Tabs Take 1 tablet by mouth daily.   feeding supplement Liqd Take 237 mLs by mouth 3 (three) times daily between meals.   folic acid 1 MG tablet Commonly known as: FOLVITE Take 1 tablet (1 mg total) by mouth daily.   levETIRAcetam 500 MG tablet Commonly known as: KEPPRA Take 1 tablet (500 mg total) by mouth 2 (two) times daily.   melatonin 5 MG Tabs Take 5 mg by mouth at bedtime.   metoprolol tartrate 25 MG tablet Commonly known as:  LOPRESSOR Take 1 tablet (25 mg total) by mouth 2 (two) times daily. What changed: how much to take   multivitamin with minerals Tabs tablet Take 1 tablet by mouth daily.   polyethylene glycol 17 g packet Commonly known as: MIRALAX / GLYCOLAX Take 17 g by mouth daily as needed for moderate constipation. What changed:  when to take this additional instructions        Disposition: SNF     Final Dx: right craniotomy for SDH  Discharge Instructions     Call MD for:   Complete by: As directed    Call MD for:  difficulty breathing, headache or visual disturbances   Complete by: As directed    Call MD for:  hives   Complete by: As directed    Call MD for:  persistant nausea and vomiting   Complete by: As directed    Call MD for:  redness, tenderness, or signs of infection (pain, swelling, redness, odor or green/yellow discharge around incision site)   Complete by: As directed    Call MD for:  severe uncontrolled pain   Complete by: As directed    Call MD for:  temperature >100.4   Complete by: As directed    Diet - low sodium heart healthy   Complete by: As directed    Increase activity slowly   Complete by: As directed    No wound care   Complete by: As directed           Signed: Tiana Loft Shatisha Falter 02/16/2023, 8:20 AM

## 2023-02-16 NOTE — Progress Notes (Addendum)
eLink Physician-Brief Progress Note Patient Name: Dennis Levine DOB: [birth year] MRN: [id number]   Date of Service  02/16/2023  HPI/Events of Note  72 year old man with past medical history of advanced dementia, hypertension, prior stroke, seizure disorder who was found down with left facial droop and left sided weakness at his SNF.  Subdural hematoma s/p evacuation.  Experiencing asymptomatic bradycardia while sleeping.  eICU Interventions  All morning labs ordered.  No intervention for asymptomatic bradycardia.   9563 -minimal urine output-150 cc overnight.  Attempted INO cath x 2 but significant resistance with each catheterization.  Will attempt coud catheter placement.  Known history of prostatic hypertrophy.  Intervention Category Minor Interventions: Routine modifications to care plan (e.g. PRN medications for pain, fever)  Malikiah Debarr 02/16/2023, 3:21 AM

## 2023-02-16 NOTE — Evaluation (Signed)
Occupational Therapy Evaluation and Discharge Patient Details Name: Dennis Levine MRN: [id number] DOB: 1951/02/13 Today's Date: 02/16/2023   History of Present Illness 72 y.o. male presents to [facility] hospital on 02/14/2023 after being found slumped over at nursing facility. CT head reveals a large R SDH with midline shift. 11/5 Right frontoparietal craniotomy for evacuation of subdural hematoma. EEG with evidence of epileptogenicity. PMH includes prostate CA, meningioma, CVA, seizure, advanced dementia, HTN   Clinical Impression   This 72 yo male admitted and underwent above presents to acute OT with PLOF of being able to get around with/without RW at SNF but needed A for all basic ADLs except he could feed himself. He currently is more limited in his mobility status but not his ADLs status. No further acute OT needs, we will sign off.       If plan is discharge home, recommend the following: A little help with walking and/or transfers;A lot of help with bathing/dressing/bathroom;Assistance with cooking/housework;Assist for transportation;Direct supervision/assist for financial management;Direct supervision/assist for medications management    Functional Status Assessment  Patient has had a recent decline in their functional status and demonstrates the ability to make significant improvements in function in a reasonable and predictable amount of time.  Equipment Recommendations  None recommended by OT       Precautions / Restrictions Precautions Precautions: Fall Restrictions Weight Bearing Restrictions: No      Mobility Bed Mobility               General bed mobility comments: pt up on BSC upon my arrival    Transfers Overall transfer level: Needs assistance Equipment used: 1 person hand held assist (both arms standing in front of fhim) Transfers: Sit to/from Stand, Bed to chair/wheelchair/BSC Sit to Stand: Min assist Stand pivot transfers: Min assist                 Balance Overall balance assessment: Needs assistance Sitting-balance support: No upper extremity supported, Feet supported Sitting balance-Leahy Scale: Good     Standing balance support: Bilateral upper extremity supported, Reliant on assistive device for balance Standing balance-Leahy Scale: Poor                             ADL either performed or assessed with clinical judgement   ADL Overall ADL's : Needs assistance/impaired Eating/Feeding: Set up;Sitting   Grooming: Minimal assistance;Sitting   Upper Body Bathing: Minimal assistance;Sitting   Lower Body Bathing: Minimal assistance;Sit to/from stand   Upper Body Dressing : Moderate assistance;Sitting   Lower Body Dressing: Moderate assistance Lower Body Dressing Details (indicate cue type and reason): min A sit<>stand Toilet Transfer: Minimal assistance;Ambulation Toilet Transfer Details (indicate cue type and reason): simulated 3n1 to recliner 5 feet away with me standing in front of him and him holding onto my arms as he walked forward and I walked backward (small steps) Toileting- Clothing Manipulation and Hygiene: Total assistance Toileting - Clothing Manipulation Details (indicate cue type and reason): min A sit<>stand             Vision Baseline Vision/History: 1 Wears glasses Patient Visual Report: No change from baseline              Pertinent Vitals/Pain Pain Assessment Pain Assessment: Faces Faces Pain Scale: No hurt     Extremity/Trunk Assessment Upper Extremity Assessment Upper Extremity Assessment: Right hand dominant;LUE deficits/detail LUE Deficits / Details: generally weak compared to RUE (at least  some of this was pre-existing) LUE Coordination: decreased gross motor;decreased fine motor           Communication Communication Communication: No apparent difficulties   Cognition Arousal: Alert Behavior During Therapy: WFL for tasks assessed/performed Overall Cognitive  Status: History of cognitive impairments - at baseline                                                  Home Living Family/patient expects to be discharged to:: Skilled nursing facility                                        Prior Functioning/Environment               Mobility Comments: Per niece on phone pt could ambulate with and without (would leave it due to dementia) RW ADLs Comments: per niece on phone she reports that he needed A for all ADLs but could feed self        OT Problem List: Decreased strength;Decreased range of motion;Impaired balance (sitting and/or standing);Decreased coordination;Impaired UE functional use;Decreased safety awareness;Decreased cognition         OT Goals(Current goals can be found in the care plan section) Acute Rehab OT Goals Patient Stated Goal: to stay here         AM-PAC OT "6 Clicks" Daily Activity     Outcome Measure Help from another person eating meals?: A Little Help from another person taking care of personal grooming?: A Little Help from another person toileting, which includes using toliet, bedpan, or urinal?: A Lot Help from another person bathing (including washing, rinsing, drying)?: A Little Help from another person to put on and taking off regular upper body clothing?: A Lot Help from another person to put on and taking off regular lower body clothing?: A Lot 6 Click Score: 15   End of Session Equipment Utilized During Treatment: Gait belt Nurse Communication: Mobility status  Activity Tolerance: Patient tolerated treatment well Patient left: in chair;with call bell/phone within reach;with chair alarm set  OT Visit Diagnosis: Unsteadiness on feet (R26.81);Other abnormalities of gait and mobility (R26.89);Other symptoms and signs involving cognitive function;Hemiplegia and hemiparesis Hemiplegia - Right/Left: Left Hemiplegia - dominant/non-dominant: Non-Dominant Hemiplegia -  caused by:  [facility])                Time: 4782-9562 OT Time Calculation (min): 16 min Charges:  OT General Charges $OT Visit: 1 Visit OT Evaluation $OT Eval Moderate Complexity: 1 Mod  Cathy L. OT Acute Rehabilitation Services Office [phone]    Evette Georges 02/16/2023, 10:39 AM

## 2023-02-16 NOTE — Evaluation (Signed)
Physical Therapy Evaluation Patient Details Name: Dennis Levine MRN: [id number] DOB: Sep 24, 1950 Today's Date: 02/16/2023  History of Present Illness  72 y.o. male presents to [facility] hospital on 02/14/2023 after being found slumped over at nursing facility. CT head reveals a large R SDH with midline shift. 11/5 Right frontoparietal craniotomy for evacuation of subdural hematoma. EEG with evidence of epileptogenicity. PMH includes prostate CA, meningioma, CVA, seizure, advanced dementia, HTN  Clinical Impression  Pt is not yet at baseline functioning, but heading back to his LT SNF bed.  He can benefit from ST PT and should be safe at facility with minimal assist overall. There are no further acute PT needs.  Will sign off at this time.         If plan is discharge home, recommend the following: A little help with walking and/or transfers;A little help with bathing/dressing/bathroom;Assistance with cooking/housework;Direct supervision/assist for medications management;Direct supervision/assist for financial management;Assist for transportation;Help with stairs or ramp for entrance   Can travel by private vehicle   No    Equipment Recommendations None recommended by PT  Recommendations for Other Services       Functional Status Assessment Patient has had a recent decline in their functional status and demonstrates the ability to make significant improvements in function in a reasonable and predictable amount of time.     Precautions / Restrictions Precautions Precautions: Fall Restrictions Weight Bearing Restrictions: No      Mobility  Bed Mobility               General bed mobility comments: in chair on arrival, returned to the chair.    Transfers Overall transfer level: Needs assistance Equipment used: 1 person hand held assist (both arms standing in front of fhim) Transfers: Sit to/from Stand, Bed to chair/wheelchair/BSC Sit to Stand: Min assist Stand pivot transfers:  Min assist         General transfer comment: cues for hand placement and assist for coming forward and boost.    Ambulation/Gait Ambulation/Gait assistance: Min assist Gait Distance (Feet): 80 Feet Assistive device: Rolling walker (2 wheels) Gait Pattern/deviations: Step-through pattern   Gait velocity interpretation: <1.31 ft/sec, indicative of household ambulator   General Gait Details: mildly unsteady, slow, low amplitude steps with increased list L as gait progressed.  Pt able to maneuver the RW himself though needing cues.  Stairs            Wheelchair Mobility     Tilt Bed    Modified Rankin (Stroke Patients Only)       Balance Overall balance assessment: Needs assistance Sitting-balance support: No upper extremity supported, Feet supported Sitting balance-Leahy Scale: Good     Standing balance support: Bilateral upper extremity supported, Reliant on assistive device for balance Standing balance-Leahy Scale: Poor                               Pertinent Vitals/Pain Pain Assessment Pain Assessment: Faces Faces Pain Scale: No hurt Pain Intervention(s): Monitored during session    Home Living Family/patient expects to be discharged to:: Skilled nursing facility                        Prior Function               Mobility Comments: Per niece on phone pt could ambulate with and without (would leave it due to dementia) RW ADLs Comments:  per niece on phone she reports that he needed A for all ADLs but could feed self     Extremity/Trunk Assessment   Upper Extremity Assessment Upper Extremity Assessment: Generalized weakness (general weakness bil, R grip weaker than L, but generally L UE weaker than R) LUE Deficits / Details: generally weak compared to RUE (at least some of this was pre-existing) LUE Coordination: decreased gross motor;decreased fine motor    Lower Extremity Assessment Lower Extremity Assessment:  Generalized weakness;Overall WFL for tasks assessed       Communication   Communication Communication: No apparent difficulties  Cognition Arousal: Alert Behavior During Therapy: WFL for tasks assessed/performed Overall Cognitive Status: History of cognitive impairments - at baseline                                          General Comments General comments (skin integrity, edema, etc.): vss,  pt is very directable though needs consistent cues    Exercises     Assessment/Plan    PT Assessment Patient needs continued PT services  PT Problem List Decreased strength;Decreased activity tolerance;Decreased balance;Decreased mobility;Decreased coordination;Decreased cognition;Decreased knowledge of use of DME       PT Treatment Interventions      PT Goals (Current goals can be found in the Care Plan section)  Acute Rehab PT Goals PT Goal Formulation: All assessment and education complete, DC therapy    Frequency       Co-evaluation               AM-PAC PT "6 Clicks" Mobility  Outcome Measure Help needed turning from your back to your side while in a flat bed without using bedrails?: A Little Help needed moving from lying on your back to sitting on the side of a flat bed without using bedrails?: A Little Help needed moving to and from a bed to a chair (including a wheelchair)?: A Little Help needed standing up from a chair using your arms (e.g., wheelchair or bedside chair)?: A Little Help needed to walk in hospital room?: A Little Help needed climbing 3-5 steps with a railing? : A Lot 6 Click Score: 17    End of Session   Activity Tolerance: Patient tolerated treatment well;Patient limited by fatigue Patient left: in chair;with call bell/phone within reach;with chair alarm set Nurse Communication: Mobility status PT Visit Diagnosis: Other abnormalities of gait and mobility (R26.89);Other symptoms and signs involving the nervous system (R29.898)     Time: 1610-9604 PT Time Calculation (min) (ACUTE ONLY): 15 min   Charges:   PT Evaluation $PT Eval Moderate Complexity: 1 Mod   PT General Charges $$ ACUTE PT VISIT: 1 Visit         02/16/2023  Jacinto Halim., PT Acute Rehabilitation Services [phone]  (office)  Eliseo Gum Matty Deamer 02/16/2023, 10:56 AM

## 2023-03-23 ENCOUNTER — Other Ambulatory Visit (HOSPITAL_COMMUNITY): Payer: Self-pay | Admitting: Student

## 2023-03-23 DIAGNOSIS — S065XAA Traumatic subdural hemorrhage with loss of consciousness status unknown, initial encounter: Secondary | ICD-10-CM

## 2023-04-14 ENCOUNTER — Ambulatory Visit (HOSPITAL_COMMUNITY): Payer: 59

## 2023-04-27 ENCOUNTER — Ambulatory Visit (HOSPITAL_COMMUNITY): Payer: 59

## 2023-04-27 ENCOUNTER — Encounter (HOSPITAL_COMMUNITY): Payer: Self-pay

## 2024-04-26 ENCOUNTER — Telehealth: Payer: Self-pay

## 2024-04-26 ENCOUNTER — Other Ambulatory Visit (HOSPITAL_COMMUNITY): Payer: Self-pay

## 2024-04-26 NOTE — Telephone Encounter (Signed)
 [facility] Patient Advocate Encounter  Insurance verification completed.    The patient is insured through [employer].     Ran test claim for EPCLUSA  Medication will need a PA. PREFERRED    Ran test claim for VOSEVI Medication will need a PA.PREFERRED    Ran test claim for MAVYRET Medication will need a PA.    We will continue to follow to see if copay assistance is needed.  This test claim was processed through [facility]- copay amounts may vary at other pharmacies due to pharmacy/plan contracts, or as the patient moves through the different stages of their insurance plan.

## 2024-05-09 ENCOUNTER — Other Ambulatory Visit: Payer: Self-pay

## 2024-05-09 ENCOUNTER — Ambulatory Visit (INDEPENDENT_AMBULATORY_CARE_PROVIDER_SITE_OTHER): Admitting: Family

## 2024-05-09 ENCOUNTER — Encounter: Payer: Self-pay | Admitting: Family

## 2024-05-09 VITALS — BP 164/95 | HR 105 | Temp 97.8°F

## 2024-05-09 DIAGNOSIS — Z8619 Personal history of other infectious and parasitic diseases: Secondary | ICD-10-CM

## 2024-05-09 DIAGNOSIS — B182 Chronic viral hepatitis C: Secondary | ICD-10-CM

## 2024-05-09 NOTE — Assessment & Plan Note (Signed)
 Mr. Juvenal is a 74 year old African-American male with chronic hepatitis C with risk factors of age and tattoo.  Treatment nave and asymptomatic. Discussed the basics of Hepatitis C including transmission, risk if left untreated, lab work, treatment options including side effects, available financial assistance, and plan of care.  Check remaining lab work as detailed below.  Plan for treatment with Epclusa pending lab work results.  Follow-up 1 month after start of medication.

## 2024-05-09 NOTE — Assessment & Plan Note (Signed)
 Mr. Ledee has a positive RPR titer 1: 1 and per the health department he was treated in 2008 with 2,400,000 units of Bicillin weekly x 3.  This RPR titer appears serofast with no further evidence of infection or indications for treatment at this time.

## 2024-05-09 NOTE — Progress Notes (Signed)
 "  Subjective:   Patient ID: Dennis Levine, male    DOB: Dec 05, 1950, 74 y.o.   MRN: [id number]  Chief Complaint  Patient presents with   Hepatitis C    HPI:  Dennis Levine is a 74 y.o. male with previous medical history of of hypertension, prostate cancer, stroke, seizure disorder, and right subdural hematoma referred for Hepatitis C and presenting for initial office visit.   Dennis Levine is currently a resident at [facility] with lab work reviewed from the facility completed on 03/29/23 showing a positive Hepatitis C antibody, Hepatitis C Genotype 1a and Hepatitis C RNA level was ordered but was not performed secondary to insufficient quantity. Liver function tests showed AST 27 and ALT 33. Had been previously tested in March 2017 with lab work reviewed showing a positive Hepatitis C antibody and Hepatitis C RNA level 1.3 million. Previously referred to Infectious Disease which does not seem to have been followed through. Also noted to have a positive RPR titer of 1:1 with same blood work. Spoke with [facility] and had been previously treated with 2.4 million units of bicillin weekly for 3 weeks and has remained at 1:1 indicating he is serofast.   Dennis Levine is here today from [place] and his aide informs that he is generally confused and may not provide complete history information. Risk factor for hepatitis C include age being born between [birth year] and 1966, and a tattoo that was obtained while he was incarcerated.  No current symptoms and denies abdominal pain, nausea, vomiting, fatigue, fever, scleral icterus or jaundice.  No previous treatment for hepatitis C.  No  personal or family history of liver disease.  No current recreational or illicit drug use, tobacco use, or alcohol consumption.   Allergies[1]    Outpatient Medications Prior to Visit  Medication Sig Dispense Refill   acetaminophen  (TYLENOL ) 325 MG tablet Take 650 mg by mouth every 4  (four) hours as needed for moderate pain (pain score 4-6).     amLODipine  (NORVASC ) 10 MG tablet Take 1 tablet (10 mg total) by mouth daily. 30 tablet 0   atorvastatin  (LIPITOR) 40 MG tablet Take 1 tablet (40 mg total) by mouth daily. 30 tablet 0   B Complex-Biotin-FA TABS Take 1 tablet by mouth daily.     feeding supplement (ENSURE ENLIVE / ENSURE PLUS) LIQD Take 237 mLs by mouth 3 (three) times daily between meals. (Patient not taking: Reported on 02/14/2023) 237 mL 12   folic acid  (FOLVITE ) 1 MG tablet Take 1 tablet (1 mg total) by mouth daily. (Patient not taking: Reported on 02/14/2023) 30 tablet 0   levETIRAcetam  (KEPPRA ) 500 MG tablet Take 1 tablet (500 mg total) by mouth 2 (two) times daily. 60 tablet 0   melatonin 5 MG TABS Take 5 mg by mouth at bedtime.     metoprolol  tartrate (LOPRESSOR ) 25 MG tablet Take 1 tablet (25 mg total) by mouth 2 (two) times daily. (Patient taking differently: Take 12.5 mg by mouth 2 (two) times daily.) 60 tablet 0   Multiple Vitamin (MULTIVITAMIN WITH MINERALS) TABS tablet Take 1 tablet by mouth daily. (Patient not taking: Reported on 02/14/2023)     polyethylene glycol (MIRALAX  / GLYCOLAX ) 17 g packet Take 17 g by mouth daily as needed for moderate constipation. (Patient taking differently: Take 17 g by mouth See admin instructions. Once a day every 2 days for constipation) 14 each 0   No facility-administered medications prior to  visit.     Past Medical History:  Diagnosis Date   Cancer [facility])    prostate   Hepatitis    hepatitis c    Hypertension      Past Surgical History:  Procedure Laterality Date   4th index finger straightened  1990's   CRANIOTOMY Right 02/14/2023   Procedure: CRANIOTOMY HEMATOMA EVACUATION SUBDURAL;  Surgeon: Joshua Alm Hamilton, MD;  Location[facility] OR;  Service: Neurosurgery;  Laterality: Right;   finger surgery for injury  1970's right hand   PELVIC LYMPH NODE DISSECTION N/A 02/03/2016   Procedure: PELVIC LYMPH NODE  DISSECTION;  Surgeon: Morene LELON Salines, MD;  Location: [facility] ORS;  Service: Urology;  Laterality: N/A;   ROBOT ASSISTED LAPAROSCOPIC RADICAL PROSTATECTOMY N/A 02/03/2016   Procedure: XI ROBOTIC ASSISTED LAPAROSCOPIC RADICAL PROSTATECTOMY;  Surgeon: Morene LELON Salines, MD;  Location: [facility] ORS;  Service: Urology;  Laterality: N/A;       Review of Systems  Constitutional:  Negative for chills, diaphoresis, fatigue and fever.  Respiratory:  Negative for cough, chest tightness, shortness of breath and wheezing.   Cardiovascular:  Negative for chest pain.  Gastrointestinal:  Negative for abdominal distention, abdominal pain, constipation, diarrhea, nausea and vomiting.  Neurological:  Negative for weakness and headaches.  Hematological:  Does not bruise/bleed easily.    Objective:   BP (!) 164/95   Pulse (!) 105   Temp 97.8 F (36.6 C) (Oral)   SpO2 100%  Nursing note and vital signs reviewed.  Physical Exam Constitutional:      General: He is not in acute distress.    Appearance: He is well-developed.  Cardiovascular:     Rate and Rhythm: Normal rate and regular rhythm.     Heart sounds: Normal heart sounds. No murmur heard.    No friction rub. No gallop.  Pulmonary:     Effort: Pulmonary effort is normal. No respiratory distress.     Breath sounds: Normal breath sounds. No wheezing or rales.  Chest:     Chest wall: No tenderness.  Abdominal:     General: Bowel sounds are normal. There is no distension.     Palpations: Abdomen is soft. There is no mass.     Tenderness: There is no abdominal tenderness. There is no guarding or rebound.  Skin:    General: Skin is warm and dry.  Neurological:     Mental Status: He is alert.         08/31/2015   11:09 AM  Depression screen PHQ 2/9  Decreased Interest 0  Down, Depressed, Hopeless 0  PHQ - 2 Score 0     Assessment & Plan:    Patient Active Problem List   Diagnosis Date Noted   Chronic hepatitis C without hepatic coma  (HCC) 05/09/2024   History of syphilis 05/09/2024   SDH (subdural hematoma) (HCC) 02/14/2023   S/P craniotomy 02/14/2023   Pain due to onychomycosis of toenails of both feet 12/24/2021   CVA (cerebral vascular accident) (HCC) 10/07/2021   Hypernatremia 10/07/2021   Generalized weakness 10/07/2021   Dementia (HCC) 04/23/2021   Benign essential HTN 04/17/2021   Meningioma (HCC) 04/16/2021   Alcohol abuse 04/16/2021   SARS-CoV-2 positive 04/16/2021   CKD (chronic kidney disease), symptom management only, stage 2 (mild) 04/16/2021   Hyponatremia 04/16/2021   Prediabetes 04/16/2021   Protein-calorie malnutrition, severe 03/26/2021   Seizures (HCC) 03/23/2021   Prostate cancer (HCC) 02/03/2016     Problem List Items Addressed This Visit  Digestive   Chronic hepatitis C without hepatic coma (HCC) - Primary   Mr. Juvenal is a 74 year old African-American male with chronic hepatitis C with risk factors of age and tattoo.  Treatment nave and asymptomatic. Discussed the basics of Hepatitis C including transmission, risk if left untreated, lab work, treatment options including side effects, available financial assistance, and plan of care.  Check remaining lab work as detailed below.  Plan for treatment with Epclusa pending lab work results.  Follow-up 1 month after start of medication.      Relevant Orders   Hepatitis B surface antibody,qualitative   Hepatitis B surface antigen   Hepatitis C RNA quantitative   Liver Fibrosis, FibroTest-ActiTest   Protime-INR     Other   History of syphilis   Mr. Stawicki has a positive RPR titer 1: 1 and per the health department he was treated in 2008 with 2,400,000 units of Bicillin weekly x 3.  This RPR titer appears serofast with no further evidence of infection or indications for treatment at this time.        I am having Dennis CHANETA Adolm maintain his polyethylene glycol, feeding supplement, multivitamin with minerals, amLODipine , folic acid ,  levETIRAcetam , metoprolol  tartrate, atorvastatin , melatonin, B Complex-Biotin-FA, and acetaminophen .   Follow-up: 1 month after start of treatment or sooner if needed.   Cathlyn July, MSN, FNP-C Nurse Practitioner [facility] for Infectious Disease [facility] Main number: [phone]      [1] No Known Allergies  "

## 2024-05-09 NOTE — Patient Instructions (Signed)
 Nice to see you.  We will check your lab work today and let you know what medication we are treating you with.  Plan for follow up 1 month after starting medication.   Have a great day and stay safe!

## 2024-05-10 LAB — PROTIME-INR
INR: 1.1
Prothrombin Time: 11.7 s — ABNORMAL HIGH (ref 9.0–11.5)

## 2024-05-10 LAB — HEPATITIS B SURFACE ANTIGEN: Hepatitis B Surface Ag: NONREACTIVE

## 2024-05-10 LAB — HEPATITIS B SURFACE ANTIBODY,QUALITATIVE: Hep B S Ab: NONREACTIVE

## 2024-05-11 LAB — HEPATITIS C RNA QUANTITATIVE
HCV Quantitative Log: 6.76 {Log_IU}/mL — ABNORMAL HIGH
HCV RNA, PCR, QN: 5750000 [IU]/mL — ABNORMAL HIGH
